# Patient Record
Sex: Female | Born: 1960 | Race: White | Hispanic: No | Marital: Married | Smoking: Never smoker
Health system: Southern US, Community
[De-identification: ages and names within clinical notes are randomized; demographics above are authoritative.]

## PROBLEM LIST (undated history)

## (undated) DIAGNOSIS — G43919 Migraine, unspecified, intractable, without status migrainosus: Secondary | ICD-10-CM

## (undated) DIAGNOSIS — F32A Depression, unspecified: Secondary | ICD-10-CM

## (undated) DIAGNOSIS — R112 Nausea with vomiting, unspecified: Secondary | ICD-10-CM

## (undated) DIAGNOSIS — Z9889 Other specified postprocedural states: Secondary | ICD-10-CM

## (undated) DIAGNOSIS — F329 Major depressive disorder, single episode, unspecified: Secondary | ICD-10-CM

## (undated) DIAGNOSIS — Z90710 Acquired absence of both cervix and uterus: Secondary | ICD-10-CM

## (undated) DIAGNOSIS — G2581 Restless legs syndrome: Secondary | ICD-10-CM

## (undated) HISTORY — PX: TOTAL ABDOMINAL HYSTERECTOMY: SHX209

## (undated) HISTORY — DX: Migraine, unspecified, intractable, without status migrainosus: G43.919

## (undated) HISTORY — DX: Acquired absence of both cervix and uterus: Z90.710

## (undated) HISTORY — DX: Restless legs syndrome: G25.81

---

## 1999-03-08 ENCOUNTER — Inpatient Hospital Stay (HOSPITAL_COMMUNITY): Admission: AD | Admit: 1999-03-08 | Discharge: 1999-03-08 | Payer: Self-pay | Admitting: Obstetrics and Gynecology

## 2003-03-28 ENCOUNTER — Other Ambulatory Visit: Admission: RE | Admit: 2003-03-28 | Discharge: 2003-03-28 | Payer: Self-pay | Admitting: Obstetrics and Gynecology

## 2004-07-30 ENCOUNTER — Other Ambulatory Visit: Admission: RE | Admit: 2004-07-30 | Discharge: 2004-07-30 | Payer: Self-pay | Admitting: Obstetrics and Gynecology

## 2004-09-08 ENCOUNTER — Observation Stay (HOSPITAL_COMMUNITY): Admission: RE | Admit: 2004-09-08 | Discharge: 2004-09-09 | Payer: Self-pay | Admitting: Obstetrics and Gynecology

## 2005-03-31 ENCOUNTER — Inpatient Hospital Stay (HOSPITAL_COMMUNITY): Admission: AD | Admit: 2005-03-31 | Discharge: 2005-03-31 | Payer: Self-pay | Admitting: Obstetrics and Gynecology

## 2006-06-08 ENCOUNTER — Encounter: Admission: RE | Admit: 2006-06-08 | Discharge: 2006-06-08 | Payer: Self-pay | Admitting: Gastroenterology

## 2006-11-25 ENCOUNTER — Encounter: Admission: RE | Admit: 2006-11-25 | Discharge: 2006-11-25 | Payer: Self-pay | Admitting: Gastroenterology

## 2011-04-01 ENCOUNTER — Other Ambulatory Visit: Payer: Self-pay | Admitting: Neurology

## 2011-04-01 DIAGNOSIS — G43019 Migraine without aura, intractable, without status migrainosus: Secondary | ICD-10-CM

## 2011-04-01 DIAGNOSIS — R51 Headache: Secondary | ICD-10-CM

## 2011-04-05 ENCOUNTER — Ambulatory Visit
Admission: RE | Admit: 2011-04-05 | Discharge: 2011-04-05 | Disposition: A | Discharge status: Home / Self Care | Payer: 59 | Source: Ambulatory Visit | Attending: Neurology | Admitting: Neurology

## 2011-04-05 DIAGNOSIS — R51 Headache: Secondary | ICD-10-CM

## 2011-04-05 DIAGNOSIS — G43019 Migraine without aura, intractable, without status migrainosus: Secondary | ICD-10-CM

## 2012-11-20 ENCOUNTER — Other Ambulatory Visit: Payer: Self-pay | Admitting: Neurology

## 2012-11-22 ENCOUNTER — Other Ambulatory Visit: Payer: Self-pay | Admitting: Neurology

## 2012-11-25 ENCOUNTER — Other Ambulatory Visit: Payer: Self-pay | Admitting: Neurology

## 2012-12-04 ENCOUNTER — Ambulatory Visit (INDEPENDENT_AMBULATORY_CARE_PROVIDER_SITE_OTHER): Payer: BC Managed Care – PPO | Admitting: Nurse Practitioner

## 2012-12-04 ENCOUNTER — Encounter: Payer: Self-pay | Admitting: Nurse Practitioner

## 2012-12-04 VITALS — BP 110/70 | HR 100 | Ht 67.75 in | Wt 181.0 lb

## 2012-12-04 DIAGNOSIS — G43019 Migraine without aura, intractable, without status migrainosus: Secondary | ICD-10-CM

## 2012-12-04 DIAGNOSIS — R519 Headache, unspecified: Secondary | ICD-10-CM | POA: Insufficient documentation

## 2012-12-04 DIAGNOSIS — R51 Headache: Secondary | ICD-10-CM

## 2012-12-04 DIAGNOSIS — G2581 Restless legs syndrome: Secondary | ICD-10-CM

## 2012-12-04 HISTORY — DX: Migraine without aura, intractable, without status migrainosus: G43.019

## 2012-12-04 MED ORDER — RIZATRIPTAN BENZOATE 10 MG PO TABS: 10.0000 mg | ORAL_TABLET | ORAL | Status: DC | PRN

## 2012-12-04 NOTE — Progress Notes (Signed)
HPI: Patient returns for followup after her last visit 03/17/2012. She has a history of migraine headaches. She had a normal CT of the head in September 2012. She is currently on Topamax 150 mg daily. She takes Imitrex acutely but states it no longer works. She has tried Amerge, Relpax, and Migranal  in the past with little effect. Her headaches can be triggered by odors and she works as a Patent examiner going into people's houses. Her headaches can also be triggered by stress and lack of sleep. Depacon injections in the past have not been effective   ROS:  - headache   Physical Exam General: well developed, well nourished, seated, in no evident distress Head: head normocephalic and atraumatic. Oropharynx benign Neck: supple with no carotid or supraclavicular bruits Cardiovascular: regular rate and rhythm, no murmurs  Neurologic Exam Mental Status: Awake and fully alert. Oriented to place and time. Follows all commands Mood and affect appropriate.  Cranial Nerves: Pupils equal, briskly reactive to light. Extraocular movements full without nystagmus. Visual fields full to confrontation. Hearing intact and symmetric to finger snap. Facial sensation intact. Face, tongue, palate move normally and symmetrically. Neck flexion and extension normal.  Motor: Normal bulk and tone. Normal strength in all tested extremity muscles. Coordination: Rapid alternating movements normal in all extremities. Finger-to-nose and heel-to-shin performed accurately bilaterally. Gait and Station: Arises from chair without difficulty. Stance is normal. Gait demonstrates normal stride length and balance . Able to heel, toe and tandem walk without difficulty.  Reflexes: 2+ and symmetric. Toes downgoing.     ASSESSMENT: Intractable migraine headaches which are fairly stable, one headache a week, Imitrex does not work acutely any longer.     PLAN: Continue Topamax 150 mg daily Will try Maxalt 10 mg acute headache and  repeat x1 if needed Continue Phenergan for nausea Followup in 6 months   Nilda Riggs, GNP-BC APRN

## 2012-12-04 NOTE — Progress Notes (Signed)
I have read the note, and I agree with the clinical assessment and plan.  

## 2012-12-04 NOTE — Patient Instructions (Addendum)
Try Maxalt acutely, may repeat x1 in 24 hours Continue Phenergan as needed for nausea Continue Topamax 150 mg daily for prevention of headaches Reviewed all migraine triggers Followup in 6 months and as needed

## 2012-12-13 ENCOUNTER — Telehealth: Payer: Self-pay | Admitting: Nurse Practitioner

## 2012-12-13 MED ORDER — PREDNISONE 10 MG PO TABS: ORAL_TABLET | ORAL | Status: DC

## 2012-12-13 NOTE — Telephone Encounter (Signed)
Informed pt .

## 2012-12-13 NOTE — Telephone Encounter (Signed)
Having HA's daily. Does not feel meds are working.  HA 8 on pain scale. Says entire head is hurting. Nothing alleviates. HA's are intermittent daily. Current one she is having started in the middle of the night last night and has not went away. Requesting advice. Willing to try different med if possible.

## 2012-12-13 NOTE — Telephone Encounter (Signed)
Will call in dose pack, take as directed

## 2012-12-23 ENCOUNTER — Other Ambulatory Visit: Payer: Self-pay | Admitting: Neurology

## 2013-01-09 ENCOUNTER — Telehealth: Payer: Self-pay

## 2013-01-09 MED ORDER — SUMATRIPTAN SUCCINATE 100 MG PO TABS: 100.0000 mg | ORAL_TABLET | ORAL | Status: DC | PRN

## 2013-01-09 NOTE — Telephone Encounter (Signed)
Message copied by Malachy Moan on Tue Jan 09, 2013  9:45 AM ------      Message from: [facility], [place]      Created: Tue Jan 09, 2013  8:27 AM      Contact: pt/ cell [phone]        Amanda [name] put pt on Maxalt and it is not working. She needs to go back to generic Imitrix.  She would like for [facility] [address] Please call cell @ [phone]. ------

## 2013-01-09 NOTE — Telephone Encounter (Signed)
Eber Jones, would you like to change the patient back to Sumatriptan?  Please advise.  Thank you.

## 2013-01-09 NOTE — Telephone Encounter (Signed)
Pt can be placed back on imitrex.

## 2013-01-09 NOTE — Telephone Encounter (Signed)
Message copied by Salome Spotted on Tue Jan 09, 2013  1:56 PM ------      Message from: [facility], [place]      Created: Tue Jan 09, 2013  8:27 AM      Contact: pt/ cell [phone]        Amanda [name] put pt on Maxalt and it is not working. She needs to go back to generic Imitrix.  She would like for [facility] [address] Please call cell @ [phone]. ------

## 2013-02-08 ENCOUNTER — Ambulatory Visit: Payer: BC Managed Care – PPO | Admitting: Nurse Practitioner

## 2013-02-12 ENCOUNTER — Telehealth: Payer: Self-pay | Admitting: Nurse Practitioner

## 2013-02-12 MED ORDER — VERAPAMIL HCL ER 120 MG PO TBCR: 120.0000 mg | EXTENDED_RELEASE_TABLET | Freq: Every day | ORAL | Status: DC

## 2013-02-12 NOTE — Telephone Encounter (Signed)
I called and spoke with patient and she stated that she's having a migraine on a daily basis and she thinks Topamax needs to be changed or increasing Effexor. Patient would like to speak with physician.

## 2013-02-12 NOTE — Telephone Encounter (Signed)
Pt called wants to try to get in sooner than her apt in Nov, pt states that she is having migraines more often and would like for a nurse to give her a call to see if we something soon.

## 2013-02-12 NOTE — Telephone Encounter (Signed)
I called patient. The patient has had a significant increase in the frequency of her headaches. In the fall of 2013, her insurance company denied the use of Botox. The patient has significant depression, and beta blockers cannot be used. The patient is already on high doses of Topamax and Effexor. I will place the patient on verapamil, and we will need to get a revisit.

## 2013-02-12 NOTE — Telephone Encounter (Signed)
Pt called back needs nurse to return her call on her cell #. Thanks

## 2013-03-06 ENCOUNTER — Other Ambulatory Visit: Payer: Self-pay | Admitting: Neurology

## 2013-03-09 ENCOUNTER — Telehealth: Payer: Self-pay | Admitting: Neurology

## 2013-03-09 MED ORDER — PROMETHAZINE HCL 25 MG PO TABS: 25.0000 mg | ORAL_TABLET | Freq: Four times a day (QID) | ORAL | Status: DC | PRN

## 2013-03-09 NOTE — Telephone Encounter (Signed)
Refill was [facility] to [facility] [address]

## 2013-04-03 ENCOUNTER — Telehealth: Payer: Self-pay | Admitting: Neurology

## 2013-04-03 NOTE — Telephone Encounter (Signed)
I called pt she relayed that she sent in letter about this Cefaly (tens unit like headband).  Needs prescription for this, and would like to try for her migraines.   Faxed to [phone].

## 2013-04-05 NOTE — Telephone Encounter (Signed)
I attempted twice to fax and would not goo thru.   I called pt and relayed this to her, she stated she had gotten ok to receive the device.  I told her I would mail her the prescription so if she needs this.  Placed in mail today 04-05-13.

## 2013-05-09 ENCOUNTER — Other Ambulatory Visit: Payer: Self-pay | Admitting: Neurology

## 2013-05-14 ENCOUNTER — Other Ambulatory Visit: Payer: Self-pay | Admitting: Neurology

## 2013-06-06 ENCOUNTER — Encounter (INDEPENDENT_AMBULATORY_CARE_PROVIDER_SITE_OTHER): Payer: Self-pay

## 2013-06-06 ENCOUNTER — Ambulatory Visit (INDEPENDENT_AMBULATORY_CARE_PROVIDER_SITE_OTHER): Payer: BC Managed Care – PPO | Admitting: Nurse Practitioner

## 2013-06-06 ENCOUNTER — Encounter: Payer: Self-pay | Admitting: Nurse Practitioner

## 2013-06-06 VITALS — BP 96/72 | HR 72 | Ht 68.75 in | Wt 182.0 lb

## 2013-06-06 DIAGNOSIS — G43019 Migraine without aura, intractable, without status migrainosus: Secondary | ICD-10-CM

## 2013-06-06 DIAGNOSIS — R51 Headache: Secondary | ICD-10-CM

## 2013-06-06 DIAGNOSIS — G2581 Restless legs syndrome: Secondary | ICD-10-CM

## 2013-06-06 MED ORDER — PROMETHAZINE HCL 25 MG PO TABS: 25.0000 mg | ORAL_TABLET | Freq: Four times a day (QID) | ORAL | Status: DC | PRN

## 2013-06-06 MED ORDER — TOPIRAMATE 50 MG PO TABS: ORAL_TABLET | ORAL | Status: DC

## 2013-06-06 MED ORDER — SUMATRIPTAN SUCCINATE 100 MG PO TABS: 100.0000 mg | ORAL_TABLET | ORAL | Status: DC | PRN

## 2013-06-06 NOTE — Progress Notes (Signed)
[facility]  PATIENT: Amanda [name] DOB: [birth year]   REASON FOR VISIT: Followup for migraines   HISTORY OF PRESENT ILLNESS:Amanda [name], 52 year old returns for followup. She has a history of migraine headaches, since last seen she was switched from Maxalt to Imitrex which does work. She also has significant nausea and vomiting with her headaches and is on Phenergan. She was placed on verapamil 120 mg at bedtime by Dr. Anne Hahn however her blood pressure was  90 systolic and she stopped the medication. She continues to have frequent headaches, was fired  from her job due to her headaches. She is aware that weather changes as well as stress caused her to have headaches. Her most recent migraine occurred arguing with her daughter over her upcoming wedding. She says the Phenergan has helped her sleep and stops the nausea and vomiting. She was also given a prednisone dosepak back in June  that helped her daily headache. Significant history of depression and she does not exercise she is put on about 20 pounds in the last 2 years.She has tried Amerge, Relpax, and Migranal in the past with little effect. She has not kept a diary of her headaches  HISTORY: She has a history of migraine headaches. She had a normal CT of the head in September 2012. She is currently on Topamax 150 mg daily. She takes Imitrex acutely but states it no longer works. She has tried Amerge, Relpax, and Migranal in the past with little effect. Her headaches can be triggered by odors and she works as a Patent examiner going into people's houses. Her headaches can also be triggered by stress and lack of sleep. Depacon injections in the past have not been effective       REVIEW OF SYSTEMS: Full 14 system review of systems performed and notable only for:  Constitutional: N/A  Cardiovascular: N/A  Ear/Nose/Throat: N/A  Skin: N/A  Eyes: N/A  Respiratory: N/A  Gastroitestinal: N/A  Hematology/Lymphatic:  N/A  Endocrine: N/A Musculoskeletal:N/A  Allergy/Immunology: N/A  Neurological: Headaches  Psychiatric: N/A   ALLERGIES: Allergies  Allergen Reactions  . Aspirin     Makes her ears ring    HOME MEDICATIONS: Outpatient Prescriptions Prior to Visit  Medication Sig Dispense Refill  . Calcium Carbonate-Vit D-Min (CALCIUM 1200 PO) Take 1,200 mg by mouth.      . clonazePAM (KLONOPIN) 1 MG tablet TAKE 1 AND 1/2 TABLET AT BEDTIME  45 tablet  5  . Magnesium 400 MG CAPS Take 400 mg by mouth daily.      . metoCLOPramide (REGLAN) 10 MG tablet Take 10 mg by mouth daily.      . montelukast (SINGULAIR) 10 MG tablet Take 10 mg by mouth at bedtime.      . promethazine (PHENERGAN) 25 MG tablet TAKE 1 TABLET EVERY 6 HOURS AS NEEDED FOR NAUSEA  40 tablet  1  . SUMAtriptan (IMITREX) 100 MG tablet Take 1 tablet (100 mg total) by mouth as needed for migraine.  9 tablet  5  . topiramate (TOPAMAX) 50 MG tablet ONE TABLET IN THE MORNING AND TWO TABLETS IN THE EVENING  90 tablet  1  . venlafaxine XR (EFFEXOR-XR) 75 MG 24 hr capsule TAKE 3 CAPSULES BY MOUTH EVERY DAY  90 capsule  6  . naproxen (NAPROSYN) 500 MG tablet Take 500 mg by mouth as needed.      . predniSONE (DELTASONE) 10 MG tablet 6 day dose pack  21 tablet  0  . verapamil (CALAN-SR) 120 MG CR tablet TAKE 1 TABLET BY MOUTH AT BEDTIME  30 tablet  3   No facility-administered medications prior to visit.    PAST MEDICAL HISTORY: Past Medical History  Diagnosis Date  . Migraine with intractable migraine   . Restless leg syndrome   . History of total hysterectomy     PAST SURGICAL HISTORY: History reviewed. No pertinent past surgical history.  FAMILY HISTORY: Family History  Problem Relation Age of Onset  . Lung cancer Father   . Dementia Mother   . Migraines Sister   . Migraines Sister   . Migraines Sister   . Hypothyroidism Sister   . Hypothyroidism Sister   . Hypothyroidism Sister     SOCIAL HISTORY: History   Social History   . Marital Status: Married    Spouse Name: [name]    Number of Children: 2  . Years of Education: 14   Occupational History  .  [employer]   Social History Main Topics  . Smoking status: Never Smoker   . Smokeless tobacco: Never Used  . Alcohol Use: Yes     Comment: rarely  . Drug Use: No  . Sexual Activity: Not on file   Other Topics Concern  . Not on file   Social History Narrative   Patient lives at home with her husbandGreig Castilla) and she is a Engineer, civil (consulting). The patient has 2 daughters.    Patient drinks very little caffeine, maybe one cup daily.P   Patient has a college education.   Patient is currently not working.     PHYSICAL EXAM  Filed Vitals:   06/06/13 0830  BP: 96/72  Pulse: 72  Height: 5' 8.75" (1.746 m)  Weight: 182 lb (82.555 kg)   Body mass index is 27.08 kg/(m^2).  Generalized: Well developed, in no acute distress  Head: normocephalic and atraumatic,. Oropharynx benign  Neck: Supple, no carotid bruits  Cardiac: Regular rate rhythm, no murmur  Musculoskeletal: No deformity   Neurological examination   Mentation: Alert oriented to time, place, history taking. Follows all commands speech and language fluent  Cranial nerve II-XII: Pupils were equal round reactive to light extraocular movements were full, visual field were full on confrontational test. Facial sensation and strength were normal. hearing was intact to finger rubbing bilaterally. Uvula tongue midline. head turning and shoulder shrug and were normal and symmetric.Tongue protrusion into cheek strength was normal. Motor: normal bulk and tone, full strength in the BUE, BLE, fine finger movements normal, no pronator drift. No focal weakness Coordination: finger-nose-finger, heel-to-shin bilaterally, no dysmetria Reflexes: Brachioradialis 2/2, biceps 2/2, triceps 2/2, patellar 2/2, Achilles 2/2, plantar responses were flexor bilaterally. Gait and Station: Rising up from seated position without  assistance, normal stance,  moderate stride, good arm swing, smooth turning, able to perform tiptoe, and heel walking without difficulty. Tandem gait steady  DIAGNOSTIC DATA (LABS, IMAGING, TESTING) - None to review  ASSESSMENT AND PLAN  52 y.o. year old female  has a past medical history of Migraine with intractable migraine; Restless leg syndrome; and History of total hysterectomy. here to followup for frequent headaches. She has not kept a diary. Since last seen she had a  prednisone Dosepak which was beneficial, switched from Maxalt to Imitrex, and had decreased blood pressure on verapamil so she stopped it.  He remains on Topamax 150 twice daily and magnesium 400 mg daily.  Continue Topamax 150 mg daily Continue Imitrex will refill Given information  on butterbur for migraines and Ausanil, both homeopathic remedies for migraine Ribloflavin 100mg   2tabs twice daily F/U in 3 months, pt to keep a headache diary. Vst time 35 min Nilda Riggs, [facility], [facility], APRN  [facility] [address], [address] [place], [place] [address] [phone]

## 2013-06-06 NOTE — Patient Instructions (Signed)
Continue Topamax 150 mg daily Continue Imitrex will refill Given information on butterbur for migraines and Ausanil, both homeopathic remedies for migraine Ribloflavin 100mg   2tabs twice daily F/U in 3 months

## 2013-06-10 ENCOUNTER — Other Ambulatory Visit: Payer: Self-pay | Admitting: Neurology

## 2013-06-11 ENCOUNTER — Other Ambulatory Visit: Payer: Self-pay | Admitting: Neurology

## 2013-06-11 NOTE — Telephone Encounter (Signed)
[phone]

## 2013-09-07 ENCOUNTER — Ambulatory Visit: Payer: BC Managed Care – PPO | Admitting: Nurse Practitioner

## 2015-11-11 DIAGNOSIS — G4440 Drug-induced headache, not elsewhere classified, not intractable: Secondary | ICD-10-CM | POA: Diagnosis not present

## 2015-11-11 DIAGNOSIS — G2581 Restless legs syndrome: Secondary | ICD-10-CM | POA: Diagnosis not present

## 2015-11-11 DIAGNOSIS — G43719 Chronic migraine without aura, intractable, without status migrainosus: Secondary | ICD-10-CM | POA: Diagnosis not present

## 2015-11-11 DIAGNOSIS — F4321 Adjustment disorder with depressed mood: Secondary | ICD-10-CM | POA: Diagnosis not present

## 2015-11-14 DIAGNOSIS — F331 Major depressive disorder, recurrent, moderate: Secondary | ICD-10-CM | POA: Diagnosis not present

## 2015-11-24 DIAGNOSIS — F331 Major depressive disorder, recurrent, moderate: Secondary | ICD-10-CM | POA: Diagnosis not present

## 2015-12-03 DIAGNOSIS — Z1231 Encounter for screening mammogram for malignant neoplasm of breast: Secondary | ICD-10-CM | POA: Diagnosis not present

## 2015-12-03 DIAGNOSIS — Z01419 Encounter for gynecological examination (general) (routine) without abnormal findings: Secondary | ICD-10-CM | POA: Diagnosis not present

## 2015-12-03 DIAGNOSIS — Z6828 Body mass index (BMI) 28.0-28.9, adult: Secondary | ICD-10-CM | POA: Diagnosis not present

## 2015-12-29 DIAGNOSIS — F331 Major depressive disorder, recurrent, moderate: Secondary | ICD-10-CM | POA: Diagnosis not present

## 2016-01-21 DIAGNOSIS — F331 Major depressive disorder, recurrent, moderate: Secondary | ICD-10-CM | POA: Diagnosis not present

## 2016-02-03 DIAGNOSIS — G43019 Migraine without aura, intractable, without status migrainosus: Secondary | ICD-10-CM | POA: Diagnosis not present

## 2016-02-03 DIAGNOSIS — G2581 Restless legs syndrome: Secondary | ICD-10-CM | POA: Diagnosis not present

## 2016-02-11 DIAGNOSIS — F331 Major depressive disorder, recurrent, moderate: Secondary | ICD-10-CM | POA: Diagnosis not present

## 2016-03-03 DIAGNOSIS — G43009 Migraine without aura, not intractable, without status migrainosus: Secondary | ICD-10-CM | POA: Diagnosis not present

## 2016-03-03 DIAGNOSIS — G43011 Migraine without aura, intractable, with status migrainosus: Secondary | ICD-10-CM | POA: Diagnosis not present

## 2016-03-04 DIAGNOSIS — F331 Major depressive disorder, recurrent, moderate: Secondary | ICD-10-CM | POA: Diagnosis not present

## 2016-04-15 DIAGNOSIS — F331 Major depressive disorder, recurrent, moderate: Secondary | ICD-10-CM | POA: Diagnosis not present

## 2016-04-18 ENCOUNTER — Observation Stay (HOSPITAL_BASED_OUTPATIENT_CLINIC_OR_DEPARTMENT_OTHER)
Admission: EM | Admit: 2016-04-18 | Discharge: 2016-04-19 | Disposition: A | Discharge status: Home / Self Care | Payer: BLUE CROSS/BLUE SHIELD | Attending: Family Medicine | Admitting: Family Medicine

## 2016-04-18 ENCOUNTER — Emergency Department (HOSPITAL_BASED_OUTPATIENT_CLINIC_OR_DEPARTMENT_OTHER): Payer: BLUE CROSS/BLUE SHIELD

## 2016-04-18 ENCOUNTER — Encounter (HOSPITAL_BASED_OUTPATIENT_CLINIC_OR_DEPARTMENT_OTHER): Payer: Self-pay | Admitting: Emergency Medicine

## 2016-04-18 DIAGNOSIS — G43019 Migraine without aura, intractable, without status migrainosus: Secondary | ICD-10-CM | POA: Diagnosis not present

## 2016-04-18 DIAGNOSIS — R4701 Aphasia: Secondary | ICD-10-CM | POA: Insufficient documentation

## 2016-04-18 DIAGNOSIS — R4789 Other speech disturbances: Secondary | ICD-10-CM | POA: Diagnosis not present

## 2016-04-18 DIAGNOSIS — F329 Major depressive disorder, single episode, unspecified: Secondary | ICD-10-CM | POA: Insufficient documentation

## 2016-04-18 DIAGNOSIS — G9340 Encephalopathy, unspecified: Secondary | ICD-10-CM | POA: Diagnosis not present

## 2016-04-18 DIAGNOSIS — G43809 Other migraine, not intractable, without status migrainosus: Secondary | ICD-10-CM

## 2016-04-18 DIAGNOSIS — Z886 Allergy status to analgesic agent status: Secondary | ICD-10-CM | POA: Diagnosis not present

## 2016-04-18 DIAGNOSIS — R270 Ataxia, unspecified: Secondary | ICD-10-CM | POA: Diagnosis not present

## 2016-04-18 DIAGNOSIS — G2581 Restless legs syndrome: Secondary | ICD-10-CM | POA: Diagnosis not present

## 2016-04-18 DIAGNOSIS — R4182 Altered mental status, unspecified: Secondary | ICD-10-CM

## 2016-04-18 DIAGNOSIS — E860 Dehydration: Secondary | ICD-10-CM | POA: Insufficient documentation

## 2016-04-18 DIAGNOSIS — R739 Hyperglycemia, unspecified: Secondary | ICD-10-CM | POA: Insufficient documentation

## 2016-04-18 DIAGNOSIS — Z7401 Bed confinement status: Secondary | ICD-10-CM | POA: Diagnosis not present

## 2016-04-18 DIAGNOSIS — G8929 Other chronic pain: Secondary | ICD-10-CM | POA: Insufficient documentation

## 2016-04-18 DIAGNOSIS — R479 Unspecified speech disturbances: Secondary | ICD-10-CM

## 2016-04-18 HISTORY — DX: Major depressive disorder, single episode, unspecified: F32.9

## 2016-04-18 HISTORY — DX: Depression, unspecified: F32.A

## 2016-04-18 LAB — CBC
HCT: 47 % — ABNORMAL HIGH (ref 36.0–46.0)
HEMOGLOBIN: 15.4 g/dL — AB (ref 12.0–15.0)
MCH: 32.2 pg (ref 26.0–34.0)
MCHC: 32.8 g/dL (ref 30.0–36.0)
MCV: 98.1 fL (ref 78.0–100.0)
Platelets: 359 10*3/uL (ref 150–400)
RBC: 4.79 MIL/uL (ref 3.87–5.11)
RDW: 13.8 % (ref 11.5–15.5)
WBC: 15 10*3/uL — ABNORMAL HIGH (ref 4.0–10.5)

## 2016-04-18 LAB — COMPREHENSIVE METABOLIC PANEL
ALK PHOS: 78 U/L (ref 38–126)
ALT: 13 U/L — AB (ref 14–54)
ANION GAP: 11 (ref 5–15)
AST: 29 U/L (ref 15–41)
Albumin: 4.3 g/dL (ref 3.5–5.0)
BILIRUBIN TOTAL: 0.5 mg/dL (ref 0.3–1.2)
BUN: 13 mg/dL (ref 6–20)
CALCIUM: 9.2 mg/dL (ref 8.9–10.3)
CO2: 24 mmol/L (ref 22–32)
CREATININE: 1.1 mg/dL — AB (ref 0.44–1.00)
Chloride: 109 mmol/L (ref 101–111)
GFR, EST NON AFRICAN AMERICAN: 55 mL/min — AB (ref >60)
Glucose, Bld: 190 mg/dL — ABNORMAL HIGH (ref 65–99)
Potassium: 3.8 mmol/L (ref 3.5–5.1)
SODIUM: 144 mmol/L (ref 135–145)
TOTAL PROTEIN: 7.6 g/dL (ref 6.5–8.1)

## 2016-04-18 LAB — URINALYSIS, ROUTINE W REFLEX MICROSCOPIC
GLUCOSE, UA: NEGATIVE mg/dL
Ketones, ur: 15 mg/dL — AB
LEUKOCYTES UA: NEGATIVE
Nitrite: NEGATIVE
PH: 5.5 (ref 5.0–8.0)
PROTEIN: NEGATIVE mg/dL
SPECIFIC GRAVITY, URINE: 1.018 (ref 1.005–1.030)

## 2016-04-18 LAB — URINE MICROSCOPIC-ADD ON

## 2016-04-18 LAB — SALICYLATE LEVEL: Salicylate Lvl: <4 mg/dL (ref 2.8–30.0)

## 2016-04-18 LAB — ETHANOL

## 2016-04-18 LAB — I-STAT CG4 LACTIC ACID, ED: Lactic Acid, Venous: 2.5 mmol/L (ref 0.5–1.9)

## 2016-04-18 LAB — RAPID URINE DRUG SCREEN, HOSP PERFORMED
Amphetamines: NOT DETECTED
BARBITURATES: NOT DETECTED
BENZODIAZEPINES: POSITIVE — AB
COCAINE: NOT DETECTED
OPIATES: POSITIVE — AB
Tetrahydrocannabinol: NOT DETECTED

## 2016-04-18 LAB — ACETAMINOPHEN LEVEL: Acetaminophen (Tylenol), Serum: <10 ug/mL — ABNORMAL LOW (ref 10–30)

## 2016-04-18 MED ORDER — SODIUM CHLORIDE 0.9 % IV BOLUS (SEPSIS)
1000.0000 mL | Freq: Once | INTRAVENOUS | Status: AC
  Administered 2016-04-19: 1000 mL via INTRAVENOUS

## 2016-04-18 NOTE — ED Triage Notes (Addendum)
Pt in with family c/o altered mental status, lethargy, ataxia, slurred speech and expressive aphasia. Pt with hx of chronic migraines and husband states she stays in bed most of the time. States noticed approx 45 min PTA that pt was slurring and her words were not making sense. These sx are not typical of her migraines. Pt is alert, interactive, in NAD.

## 2016-04-18 NOTE — ED Notes (Signed)
MD at bedside. 

## 2016-04-18 NOTE — ED Notes (Signed)
Pt I&O catherized due to inability to void on her own. [name] RN assisting with procedure, patient and family educated prior to procedure.

## 2016-04-18 NOTE — ED Notes (Signed)
Pt returned from CT °

## 2016-04-18 NOTE — ED Notes (Signed)
Patient transported to CT 

## 2016-04-18 NOTE — ED Provider Notes (Addendum)
[facility] Provider Note   CSN: [id number] Arrival date & time: 04/18/16  2250     History   Chief Complaint Chief Complaint  Patient presents with  . Aphasia  . Altered Mental Status    HPI Amanda [name] is a 55 y.o. female.  Patient with hx chronic 'migraines' presents w family who indicates seh seems generally slow to respond today, and that she seemed confused.  Spouse indicates he last spoke with patient when she seemed at her baseline yesterday - as such, patients last seen normal time was at some point yesterday (Saturday) evening. Today he went to church, and in and out of home, and each time he checked on patient she was sleeping.  He indicates w her migraines/chronic pain, and depression that she is in bed, most all of the time, for the past 3 years.   Indicates in past couple months some increasing depression, and that her abilify dose was adjusted last month.  Only other medical change was several months ago at which time was taken off effexor and placed on abilify.  Patient does give herself her own meds. Family indicates she does not take extra of her meds. Pt denies overuse/overdose, but is extremely limited historian - level 5 caveat.  Patient seems globally confused, slow to respond, slurs some words/inconsistent.  Patients speech content seems largely non sensical, with words being joined, pieced together in odd ways, or confabulated. Does move bil ext purposefully but for most part does not follow commands and seems to exert poor effort on neuro exam.  Family/pt denies fever. No report of trauma or fall.    The history is provided by the patient, the spouse and a relative. The history is limited by the condition of the patient.  Altered Mental Status   Associated symptoms include confusion.    Past Medical History:  Diagnosis Date  . History of total hysterectomy   . Migraine with intractable migraine   . Restless leg syndrome     Patient Active  Problem List   Diagnosis Date Noted  . Migraine without aura, with intractable migraine, so stated, without mention of status migrainosus 12/04/2012  . Headache(784.0) 12/04/2012  . Restless legs syndrome (RLS) 12/04/2012    History reviewed. No pertinent surgical history.  OB History    No data available       Home Medications    Prior to Admission medications   Medication Sig Start Date End Date Taking? Authorizing Provider  Calcium Carbonate-Vit D-Min (CALCIUM 1200 PO) Take 1,200 mg by mouth.    Historical Provider, MD  clonazePAM (KLONOPIN) 1 MG tablet TAKE 1&1/2 TABLETS BY MOUTH AT BEDTIME 06/10/13   York Spaniel, MD  Magnesium 400 MG CAPS Take 400 mg by mouth daily.    Historical Provider, MD  metoCLOPramide (REGLAN) 10 MG tablet Take 10 mg by mouth daily.    Historical Provider, MD  montelukast (SINGULAIR) 10 MG tablet Take 10 mg by mouth at bedtime.    Historical Provider, MD  promethazine (PHENERGAN) 25 MG tablet Take 1 tablet (25 mg total) by mouth every 6 (six) hours as needed for nausea or vomiting. 06/06/13   Nilda Riggs, NP  SUMAtriptan (IMITREX) 100 MG tablet Take 1 tablet (100 mg total) by mouth as needed for migraine. 06/06/13   Nilda Riggs, NP  topiramate (TOPAMAX) 50 MG tablet 1 tab in the am, 2 tabs at night 06/06/13   Nilda Riggs, NP  venlafaxine  XR (EFFEXOR-XR) 75 MG 24 hr capsule TAKE 3 CAPSULES BY MOUTH EVERY DAY 11/22/12   York Spaniel, MD    Family History Family History  Problem Relation Age of Onset  . Dementia Mother   . Lung cancer Father   . Migraines Sister   . Migraines Sister   . Migraines Sister   . Hypothyroidism Sister   . Hypothyroidism Sister   . Hypothyroidism Sister     Social History Social History  Substance Use Topics  . Smoking status: Never Smoker  . Smokeless tobacco: Never Used  . Alcohol use Yes     Comment: rarely     Allergies   Aspirin   Review of Systems Review of Systems    Unable to perform ROS: Mental status change  Constitutional: Negative for fever.  Psychiatric/Behavioral: Positive for confusion.  patient poorly responsive/does not cooperate w ros.    Physical Exam Updated Vital Signs BP 106/79 (BP Location: Left Arm)   Pulse 90   Temp 97.8 F (36.6 C) (Oral)   Resp 16   Wt 68 kg   SpO2 95%   BMI 22.31 kg/m   Physical Exam  Constitutional: She appears well-developed and well-nourished. No distress.  HENT:  Head: Atraumatic.  Nose: Nose normal.  Mouth/Throat: Oropharynx is clear and moist.  No sinus or temporal tenderness.  Eyes: Conjunctivae and EOM are normal. Pupils are equal, round, and reactive to light. No scleral icterus.  Neck: Neck supple. No tracheal deviation present. No thyromegaly present.  No stiffness or rigidity.   Cardiovascular: Normal rate, regular rhythm, normal heart sounds and intact distal pulses.  Exam reveals no gallop and no friction rub.   No murmur heard. Pulmonary/Chest: Effort normal and breath sounds normal. No respiratory distress.  Abdominal: Soft. Normal appearance and bowel sounds are normal. She exhibits no distension. There is no tenderness.  Genitourinary:  Genitourinary Comments: No cva tenderness.  Musculoskeletal: She exhibits no edema or tenderness.  Neurological: No cranial nerve deficit.  Patient appears to be listening to conversation, but responds verbally on very limited basis.  No clear expressive aphasia or dysarthria, but slurs words . Content of speech is very odd, joining words, making up other words, confabulating others. Moves bil ext purposefully, but very poorly responsive to neuro exam, ?effort.   Skin: Skin is warm and dry. No rash noted. She is not diaphoretic.  Psychiatric:  Lethargic, slow to respond.   Nursing note and vitals reviewed.    ED Treatments / Results  Labs (all labs ordered are listed, but only abnormal results are displayed) Results for orders placed or  performed during the hospital encounter of 04/18/16  CBC  Result Value Ref Range   WBC 15.0 (H) 4.0 - 10.5 K/uL   RBC 4.79 3.87 - 5.11 MIL/uL   Hemoglobin 15.4 (H) 12.0 - 15.0 g/dL   HCT 16.1 (H) 09.6 - 04.5 %   MCV 98.1 78.0 - 100.0 fL   MCH 32.2 26.0 - 34.0 pg   MCHC 32.8 30.0 - 36.0 g/dL   RDW 40.9 81.1 - 91.4 %   Platelets 359 150 - 400 K/uL  Comprehensive metabolic panel  Result Value Ref Range   Sodium 144 135 - 145 mmol/L   Potassium 3.8 3.5 - 5.1 mmol/L   Chloride 109 101 - 111 mmol/L   CO2 24 22 - 32 mmol/L   Glucose, Bld 190 (H) 65 - 99 mg/dL   BUN 13 6 - 20 mg/dL  Creatinine, Ser 1.10 (H) 0.44 - 1.00 mg/dL   Calcium 9.2 8.9 - 16.110.3 mg/dL   Total Protein 7.6 6.5 - 8.1 g/dL   Albumin 4.3 3.5 - 5.0 g/dL   AST 29 15 - 41 U/L   ALT 13 (L) 14 - 54 U/L   Alkaline Phosphatase 78 38 - 126 U/L   Total Bilirubin 0.5 0.3 - 1.2 mg/dL   GFR calc non Af Amer 55 (L) >60 mL/min   GFR calc Af Amer >60 >60 mL/min   Anion gap 11 5 - 15  Urine rapid drug screen (hosp performed)  Result Value Ref Range   Opiates POSITIVE (A) NONE DETECTED   Cocaine NONE DETECTED NONE DETECTED   Benzodiazepines POSITIVE (A) NONE DETECTED   Amphetamines NONE DETECTED NONE DETECTED   Tetrahydrocannabinol NONE DETECTED NONE DETECTED   Barbiturates NONE DETECTED NONE DETECTED  Urinalysis, Routine w reflex microscopic ([facility])  Result Value Ref Range   Color, Urine YELLOW YELLOW   APPearance CLEAR CLEAR   Specific Gravity, Urine 1.018 1.005 - 1.030   pH 5.5 5.0 - 8.0   Glucose, UA NEGATIVE NEGATIVE mg/dL   Hgb urine dipstick SMALL (A) NEGATIVE   Bilirubin Urine SMALL (A) NEGATIVE   Ketones, ur 15 (A) NEGATIVE mg/dL   Protein, ur NEGATIVE NEGATIVE mg/dL   Nitrite NEGATIVE NEGATIVE   Leukocytes, UA NEGATIVE NEGATIVE  Ethanol  Result Value Ref Range   Alcohol, Ethyl (B) <5 <5 mg/dL  Salicylate level  Result Value Ref Range   Salicylate Lvl <4.0 2.8 - 30.0 mg/dL  Acetaminophen level  Result  Value Ref Range   Acetaminophen (Tylenol), Serum <10 (L) 10 - 30 ug/mL  Urine microscopic-add on  Result Value Ref Range   Squamous Epithelial / LPF 0-5 (A) NONE SEEN   WBC, UA 0-5 0 - 5 WBC/hpf   RBC / HPF 0-5 0 - 5 RBC/hpf   Bacteria, UA FEW (A) NONE SEEN  I-Stat CG4 Lactic Acid, ED  Result Value Ref Range   Lactic Acid, Venous 2.50 (HH) 0.5 - 1.9 mmol/L   Comment NOTIFIED PHYSICIAN    Dg Chest 2 View  Result Date: 04/19/2016 CLINICAL DATA:  Altered mental status, lethargy, expressive aphasia. Chronic migraines. EXAM: CHEST  2 VIEW COMPARISON:  None. FINDINGS: The heart size and mediastinal contours are within normal limits. Both lungs are clear. The visualized skeletal structures are unremarkable. IMPRESSION: Normal chest radiograph. Electronically Signed   By: Awilda Metroourtnay  Bloomer M.D.   On: 04/19/2016 00:35   Ct Head Wo Contrast  Result Date: 04/19/2016 CLINICAL DATA:  Altered mental status, lethargy, ataxia, slurred speech and expressive aphasia. History of chronic migraines. EXAM: CT HEAD WITHOUT CONTRAST TECHNIQUE: Contiguous axial images were obtained from the base of the skull through the vertex without intravenous contrast. COMPARISON:  CT HEAD April 05, 2011 FINDINGS: BRAIN: The ventricles and sulci are normal. No intraparenchymal hemorrhage, mass effect nor midline shift. No acute large vascular territory infarcts. No abnormal extra-axial fluid collections. Basal cisterns are patent. VASCULAR: Mild calcific atherosclerosis of the carotid siphons. SKULL/SOFT TISSUES: No skull fracture. Mild RIGHT, moderate LEFT temporomandibular osteoarthrosis. No significant soft tissue swelling. ORBITS/SINUSES: The included ocular globes and orbital contents are normal.The mastoid aircells and included paranasal sinuses are well-aerated. OTHER: None. IMPRESSION: Negative CT HEAD. Electronically Signed   By: Awilda Metroourtnay  Bloomer M.D.   On: 04/19/2016 00:30    EKG  EKG  Interpretation  Date/Time:  Sunday April 18 2016 23:00:19 EDT  Ventricular Rate:  88 PR Interval:    QRS Duration: 88 QT Interval:  377 QTC Calculation: 457 R Axis:   68 Text Interpretation:  Sinus rhythm No previous tracing Confirmed by Denton Lank  MD, Caryn Bee ([id number]) on 04/18/2016 11:18:48 PM       Radiology No results found.  Procedures Procedures (including critical care time)  Medications Ordered in ED Medications - No data to display   Initial Impression / Assessment and Plan / ED Course  I have reviewed the triage vital signs and the nursing notes.  Pertinent labs & imaging results that were available during my care of the patient were reviewed by me and considered in my medical decision making (see chart for details).  Clinical Course   Iv ns. Continuous pulse ox and monitor.    Lactate elevated. ?v poor po intake in past day. Afeb. Iv ns bolus.   Additional labs sent.   Reviewed nursing notes and prior charts for additional history.   Family indicates speech/mental state remains off, and that they havent seen patient like this before.   Patient continues to say non sensical words, for example, when told of plan for transfer to [facility], and possible MRI, says cant have MRI due MRI-ology on finger (indicating a ring that hasnt come off for years).   Patient appears comfortable currently, no distress. Afeb/rectal temp normal.   Discussed w Dr Julian Reil, Hospitalist, at [facility] who accepts to tele obs bed.    Final Clinical Impressions(s) / ED Diagnoses   Final diagnoses:  None    New Prescriptions New Prescriptions   No medications on file        Cathren Laine, MD 04/19/16 0147

## 2016-04-19 ENCOUNTER — Observation Stay (HOSPITAL_BASED_OUTPATIENT_CLINIC_OR_DEPARTMENT_OTHER)
Admit: 2016-04-19 | Discharge: 2016-04-19 | Disposition: A | Discharge status: Home / Self Care | Payer: BLUE CROSS/BLUE SHIELD | Attending: Internal Medicine | Admitting: Internal Medicine

## 2016-04-19 ENCOUNTER — Observation Stay (HOSPITAL_COMMUNITY): Payer: BLUE CROSS/BLUE SHIELD

## 2016-04-19 ENCOUNTER — Encounter (HOSPITAL_BASED_OUTPATIENT_CLINIC_OR_DEPARTMENT_OTHER): Payer: Self-pay | Admitting: Emergency Medicine

## 2016-04-19 DIAGNOSIS — R739 Hyperglycemia, unspecified: Secondary | ICD-10-CM | POA: Diagnosis present

## 2016-04-19 DIAGNOSIS — G43809 Other migraine, not intractable, without status migrainosus: Secondary | ICD-10-CM | POA: Diagnosis not present

## 2016-04-19 DIAGNOSIS — R4182 Altered mental status, unspecified: Secondary | ICD-10-CM

## 2016-04-19 DIAGNOSIS — R410 Disorientation, unspecified: Secondary | ICD-10-CM | POA: Diagnosis not present

## 2016-04-19 DIAGNOSIS — G9340 Encephalopathy, unspecified: Secondary | ICD-10-CM | POA: Diagnosis not present

## 2016-04-19 HISTORY — DX: Hyperglycemia, unspecified: R73.9

## 2016-04-19 HISTORY — DX: Altered mental status, unspecified: R41.82

## 2016-04-19 LAB — COMPREHENSIVE METABOLIC PANEL
ALT: 13 U/L — AB (ref 14–54)
AST: 31 U/L (ref 15–41)
Albumin: 3.3 g/dL — ABNORMAL LOW (ref 3.5–5.0)
Alkaline Phosphatase: 54 U/L (ref 38–126)
Anion gap: 7 (ref 5–15)
BUN: 10 mg/dL (ref 6–20)
CHLORIDE: 110 mmol/L (ref 101–111)
CO2: 23 mmol/L (ref 22–32)
CREATININE: 0.84 mg/dL (ref 0.44–1.00)
Calcium: 8.4 mg/dL — ABNORMAL LOW (ref 8.9–10.3)
GFR calc Af Amer: >60 mL/min (ref >60)
Glucose, Bld: 144 mg/dL — ABNORMAL HIGH (ref 65–99)
Potassium: 3.3 mmol/L — ABNORMAL LOW (ref 3.5–5.1)
Sodium: 140 mmol/L (ref 135–145)
Total Bilirubin: 0.4 mg/dL (ref 0.3–1.2)
Total Protein: 6.1 g/dL — ABNORMAL LOW (ref 6.5–8.1)

## 2016-04-19 LAB — CBC WITH DIFFERENTIAL/PLATELET
Basophils Absolute: 0 10*3/uL (ref 0.0–0.1)
Basophils Relative: 0 %
EOS ABS: 0 10*3/uL (ref 0.0–0.7)
EOS PCT: 0 %
HCT: 40.8 % (ref 36.0–46.0)
Hemoglobin: 13 g/dL (ref 12.0–15.0)
LYMPHS ABS: 0.4 10*3/uL — AB (ref 0.7–4.0)
Lymphocytes Relative: 3 %
MCH: 31.4 pg (ref 26.0–34.0)
MCHC: 31.9 g/dL (ref 30.0–36.0)
MCV: 98.6 fL (ref 78.0–100.0)
MONOS PCT: 7 %
Monocytes Absolute: 1 10*3/uL (ref 0.1–1.0)
Neutro Abs: 12.7 10*3/uL — ABNORMAL HIGH (ref 1.7–7.7)
Neutrophils Relative %: 90 %
PLATELETS: 289 10*3/uL (ref 150–400)
RBC: 4.14 MIL/uL (ref 3.87–5.11)
RDW: 13.4 % (ref 11.5–15.5)
WBC: 14.1 10*3/uL — ABNORMAL HIGH (ref 4.0–10.5)

## 2016-04-19 LAB — AMMONIA: AMMONIA: 41 umol/L — AB (ref 9–35)

## 2016-04-19 LAB — LACTIC ACID, PLASMA: LACTIC ACID, VENOUS: 1.6 mmol/L (ref 0.5–1.9)

## 2016-04-19 LAB — TSH: TSH: 0.322 u[IU]/mL — AB (ref 0.350–4.500)

## 2016-04-19 LAB — HEMOGLOBIN A1C
Hgb A1c MFr Bld: 5.1 % (ref 4.8–5.6)
Mean Plasma Glucose: 100 mg/dL

## 2016-04-19 MED ORDER — LORAZEPAM 2 MG/ML IJ SOLN
INTRAMUSCULAR | Status: AC
  Filled 2016-04-19: qty 1

## 2016-04-19 MED ORDER — PROMETHAZINE HCL 25 MG PO TABS: 25.0000 mg | ORAL_TABLET | Freq: Four times a day (QID) | ORAL | Status: DC | PRN

## 2016-04-19 MED ORDER — VENLAFAXINE HCL ER 75 MG PO CP24
225.0000 mg | ORAL_CAPSULE | Freq: Every day | ORAL | Status: DC
  Administered 2016-04-19: 225 mg via ORAL
  Filled 2016-04-19: qty 3

## 2016-04-19 MED ORDER — LORAZEPAM 2 MG/ML IJ SOLN
1.0000 mg | Freq: Once | INTRAMUSCULAR | Status: AC | PRN
  Administered 2016-04-19: 1 mg via INTRAVENOUS
  Filled 2016-04-19: qty 1

## 2016-04-19 MED ORDER — TOPIRAMATE 25 MG PO TABS
50.0000 mg | ORAL_TABLET | Freq: Every day | ORAL | Status: DC
  Administered 2016-04-19: 50 mg via ORAL
  Filled 2016-04-19: qty 2

## 2016-04-19 MED ORDER — MONTELUKAST SODIUM 10 MG PO TABS: 10.0000 mg | ORAL_TABLET | Freq: Every day | ORAL | Status: DC

## 2016-04-19 MED ORDER — CLONAZEPAM 1 MG PO TABS: 1.5000 mg | ORAL_TABLET | Freq: Every day | ORAL | Status: DC

## 2016-04-19 MED ORDER — IBUPROFEN 200 MG PO TABS
600.0000 mg | ORAL_TABLET | Freq: Four times a day (QID) | ORAL | Status: DC | PRN
  Administered 2016-04-19: 600 mg via ORAL
  Filled 2016-04-19: qty 3

## 2016-04-19 MED ORDER — MAGNESIUM 400 MG PO CAPS: 400.0000 mg | ORAL_CAPSULE | Freq: Every day | ORAL | Status: DC

## 2016-04-19 MED ORDER — MAGNESIUM OXIDE 400 (241.3 MG) MG PO TABS
400.0000 mg | ORAL_TABLET | Freq: Every day | ORAL | Status: DC
  Administered 2016-04-19: 400 mg via ORAL
  Filled 2016-04-19: qty 1

## 2016-04-19 MED ORDER — SODIUM CHLORIDE 0.9 % IV SOLN: INTRAVENOUS | Status: DC

## 2016-04-19 MED ORDER — ACETAMINOPHEN 325 MG PO TABS
650.0000 mg | ORAL_TABLET | Freq: Four times a day (QID) | ORAL | Status: DC | PRN
  Administered 2016-04-19: 650 mg via ORAL
  Filled 2016-04-19: qty 2

## 2016-04-19 MED ORDER — SODIUM CHLORIDE 0.9 % IV SOLN
INTRAVENOUS | Status: AC
  Administered 2016-04-19: 05:00:00 via INTRAVENOUS

## 2016-04-19 MED ORDER — METOCLOPRAMIDE HCL 10 MG PO TABS
10.0000 mg | ORAL_TABLET | Freq: Every day | ORAL | Status: DC
  Administered 2016-04-19: 10 mg via ORAL
  Filled 2016-04-19: qty 1

## 2016-04-19 MED ORDER — TOPIRAMATE 100 MG PO TABS: 100.0000 mg | ORAL_TABLET | Freq: Every day | ORAL | Status: DC

## 2016-04-19 MED ORDER — CLONAZEPAM 1 MG PO TABS: 1.0000 mg | ORAL_TABLET | Freq: Three times a day (TID) | ORAL | Status: DC | PRN

## 2016-04-19 MED ORDER — POTASSIUM CHLORIDE CRYS ER 20 MEQ PO TBCR
20.0000 meq | EXTENDED_RELEASE_TABLET | Freq: Once | ORAL | Status: AC
Start: 2016-04-19 — End: 2016-04-19
  Administered 2016-04-19: 20 meq via ORAL
  Filled 2016-04-19: qty 1

## 2016-04-19 MED ORDER — SODIUM CHLORIDE 0.9 % IV BOLUS (SEPSIS)
1000.0000 mL | Freq: Once | INTRAVENOUS | Status: AC
  Administered 2016-04-19: 1000 mL via INTRAVENOUS

## 2016-04-19 NOTE — Discharge Summary (Addendum)
Physician Discharge Summary  Amanda [name] ZOX:[id number] DOB: August 27, 1960 DOA: 04/18/2016  PCP: Allean Found, MD  Admit date: 04/18/2016 Discharge date: 04/19/2016  Admitted From: Home Disposition: Home   Recommendations for Outpatient Follow-up:  1. Follow up with neurology, Dr. Anne Hahn is scheduled for follow up of acute encephalopathy as well as poorly-controlled migraine management. 2. Consider decreasing topamax due to concern for encephalopathy 3. Follow up with PCP in 1-2 weeks 4. Please obtain TSH in 6 weeks (just under normal limit during hospitalization) 5. Please follow up on the following pending results: 1. Blood cultures drawn 9/25  Home Health: None recommended Equipment/Devices: None  Discharge Condition: Stable CODE STATUS: Full Diet recommendation: Heart healthy.  Brief/Interim Summary: Amanda [name] is a 55 y.o. female with chronic migraines mostly bedridden over the last 3 years because of the migraines was noticed to have difficulty selecting words with confusion on 9/24. Patient's husband states that the whole day patient was sleeping, which is not atypical for her, and around 10 PM patient had difficulty bringing out words, looked confused and was having sweating spells. Patient was taken to the ER and CT head was unremarkable. Due to persistent symptoms patient was admitted for further management of expressive aphasia with confusion.   On exam in the ED, patient appeared confused and still has difficulty bringing out words. CT head was negative. Urine drug screen was positive for opiates (patient takes Percocet) and benzodiazepine (patient takes Klonopin). UA and chest x-ray unremarkable, patient was afebrile. MRI of the brain was unremarkable and EEG showed moderate diffuse slowing of the waking background. Medications were held and patient's mental status rapidly cleared back to baseline per her husband.   On further discussion with the patient and  family, she's felt abnormal/drowsy for several months with associated word blocking, around the time topamax dose was increased. This is the presumptive etiology of chronic word blocking, and since the patient showed evidence of mild dehydration, this could have caused acute worsening which improved with IV fluids and holding the medication.   Since patient had sweating spells with labs showing elevated lactate and leukocytosis blood cultures were collected  Discharge Diagnoses:  Principal Problem:   Acute encephalopathy Active Problems:   Hyperglycemia  Discharge Instructions Discharge Instructions    Ambulatory referral to Neurology    Complete by:  As directed    Discharge instructions    Complete by:  As directed    You were admitted for confusion and altered mental status. This has fortunately improved on its own. Work up including EEG, CT of your head, MRI of the brain, and lab work did not show a definitive cause of your symptoms. Because these symptoms improved spontaneously after holding medications, it's possible that the medications you are taking are all together causing the problem.  - To avoid a recurrence of this, try to minimize your use of klonopin and follow up with Dr. Anne Hahn next Thursday at 8:30am. - If you experience, or your family notices, any symptoms that worsen or you develop trouble speaking, or localized weakness or numbness you should return to [facility] Emergency Department.       Medication List    STOP taking these medications   venlafaxine XR 75 MG 24 hr capsule Commonly known as:  EFFEXOR-XR     TAKE these medications   aspirin-acetaminophen-caffeine 250-250-65 MG tablet Commonly known as:  EXCEDRIN MIGRAINE Take 2 tablets by mouth every 6 (six) hours as needed for headache or migraine.  buPROPion 150 MG 24 hr tablet Commonly known as:  WELLBUTRIN XL Take 450 mg by mouth daily.   CALCIUM 1200 PO Take 1,200 mg by mouth 2 (two) times daily.    clonazePAM 1 MG tablet Commonly known as:  KLONOPIN TAKE 1&1/2 TABLETS BY MOUTH AT BEDTIME What changed:  See the new instructions.   FLUoxetine 40 MG capsule Commonly known as:  PROZAC Take 40 mg by mouth daily.   gabapentin 300 MG capsule Commonly known as:  NEURONTIN Take 900 mg by mouth at bedtime.   Magnesium 400 MG Caps Take 400 mg by mouth 2 (two) times daily.   ondansetron 4 MG tablet Commonly known as:  ZOFRAN Take 8 mg by mouth every 8 (eight) hours as needed for nausea or vomiting.   promethazine 25 MG tablet Commonly known as:  PHENERGAN Take 1 tablet (25 mg total) by mouth every 6 (six) hours as needed for nausea or vomiting.   SUMAtriptan 100 MG tablet Commonly known as:  IMITREX Take 1 tablet (100 mg total) by mouth as needed for migraine. What changed:  how much to take   topiramate 50 MG tablet Commonly known as:  TOPAMAX 1 tab in the am, 2 tabs at night What changed:  how much to take  how to take this  when to take this  additional instructions      Follow-up Information    Lesly Dukes, MD Follow up on 04/29/2016.   Specialty:  Neurology Why:  8:30am Contact information: [address] [place] [address] [phone]        Allean Found, MD. Schedule an appointment as soon as possible for a visit in 2 week(s).   Specialty:  Family Medicine Contact information: [phone] [place] [address] [phone]          Allergies  Allergen Reactions  . Aspirin Other (See Comments)    Makes her ears ring    Consultations:  None  Procedures/Studies: Dg Chest 2 View  Result Date: 04/19/2016 CLINICAL DATA:  Altered mental status, lethargy, expressive aphasia. Chronic migraines. EXAM: CHEST  2 VIEW COMPARISON:  None. FINDINGS: The heart size and mediastinal contours are within normal limits. Both lungs are clear. The visualized skeletal structures are unremarkable. IMPRESSION:  Normal chest radiograph. Electronically Signed   By: Awilda Metro M.D.   On: 04/19/2016 00:35   Ct Head Wo Contrast  Result Date: 04/19/2016 CLINICAL DATA:  Altered mental status, lethargy, ataxia, slurred speech and expressive aphasia. History of chronic migraines. EXAM: CT HEAD WITHOUT CONTRAST TECHNIQUE: Contiguous axial images were obtained from the base of the skull through the vertex without intravenous contrast. COMPARISON:  CT HEAD April 05, 2011 FINDINGS: BRAIN: The ventricles and sulci are normal. No intraparenchymal hemorrhage, mass effect nor midline shift. No acute large vascular territory infarcts. No abnormal extra-axial fluid collections. Basal cisterns are patent. VASCULAR: Mild calcific atherosclerosis of the carotid siphons. SKULL/SOFT TISSUES: No skull fracture. Mild RIGHT, moderate LEFT temporomandibular osteoarthrosis. No significant soft tissue swelling. ORBITS/SINUSES: The included ocular globes and orbital contents are normal.The mastoid aircells and included paranasal sinuses are well-aerated. OTHER: None. IMPRESSION: Negative CT HEAD. Electronically Signed   By: Awilda Metro M.D.   On: 04/19/2016 00:30   Mr Brain Wo Contrast  Result Date: 04/19/2016 CLINICAL DATA:  55 y/o F; history of chronic migraines presenting with confusion and word finding difficulties. EXAM: MRI HEAD WITHOUT CONTRAST TECHNIQUE: Multiplanar, multiecho pulse sequences of the  brain and surrounding structures were obtained without intravenous contrast. COMPARISON:  04/18/2016 and 04/05/2011 CT head. FINDINGS: Brain: No acute infarction, hemorrhage, hydrocephalus, extra-axial collection or mass lesion. T1/T2 intermediate to hyperintense focus within the occipital bone of the posterior fossa just inferior torcula measuring 6 mm (series 9, image 10) with diffusion restriction Vascular: Normal flow voids. Skull and upper cervical spine: T1/T2 intermediate to hyperintense focus within the occipital bone  of the posterior fossa just inferior torcula measuring 6 mm (series 9, image 10) with diffusion restriction and smooth bony lucency on CT, stable from 2012 consistent with benign etiology, possibly a hemangioma or meningioma. Sinuses/Orbits: Negative. Other: None. IMPRESSION: No acute intracranial abnormality.  Unremarkable MRI of the brain. Electronically Signed   By: Mitzi HansenLance  Furusawa-Stratton M.D.   On: 04/19/2016 13:57    EEG 04/19/2016 ELECTROENCEPHALOGRAM REPORT  Date of Study: 04/19/2016  Patient's Name: Valera CastleBeth E Mcmanamon MRN: [id number] Date of Birth: [birth year]  Referring Provider: Dr. Midge MiniumArshad Kakrakandy  Clinical History: This is a 55 year old woman with confusion.   Medications: topiramate (TOPAMAX) tablet 100 mg           topiramate (TOPAMAX) tablet 50 mg  clonazePAM (KLONOPIN) tablet 1.5 mg        acetaminophen (TYLENOL) tablet 650 mg     ibuprofen (ADVIL,MOTRIN) tablet 600 mg     magnesium oxide (MAG-OX) tablet 400 mg   metoCLOPramide (REGLAN) tablet 10 mg    montelukast (SINGULAIR) tablet 10 mg         promethazine (PHENERGAN) tablet 25 mg   venlafaxine XR (EFFEXOR-XR) 24 hr capsule 225 mg          Technical Summary: A multichannel digital EEG recording measured by the international 10-20 system with electrodes applied with paste and impedances below 5000 ohms performed as portable with EKG monitoring in an awake and asleep patient.  Hyperventilation and photic stimulation were not performed.  The digital EEG was referentially recorded, reformatted, and digitally filtered in a variety of bipolar and referential montages for optimal display.   Description: The patient is awake and asleep during the recording.  During maximal wakefulness, there is a poorly sustained medium voltage 7 Hz posterior dominant rhythm that poorly attenuates with eye opening and eye closure. This is admixed with a moderate amount of diffuse 4-5 Hz theta and 2-3 Hz delta slowing of the waking  background.  During drowsiness and sleep, there is an increase in theta and delta slowing of the background.  Vertex waves and symmetric sleep spindles were seen.  Hyperventilation and photic stimulation were not performed.  There were no epileptiform discharges or electrographic seizures seen.    EKG lead was unremarkable.  Impression: This awake and asleep EEG is abnormal due to moderate diffuse slowing of the waking background.  Clinical Correlation of the above findings indicates diffuse cerebral dysfunction that is non-specific in etiology and can be seen with hypoxic/ischemic injury, toxic/metabolic encephalopathies, neurodegenerative disorders, or medication effect.  The absence of epileptiform discharges does not rule out a clinical diagnosis of epilepsy.  Clinical correlation is advised.  Subjective: Patient still has minor blocking which has been usual for her in the past few months. She is oriented and pt's husband reports she has returned slowly to baseline. They want to go home.   Discharge Exam: Vitals:   04/19/16 1600 04/19/16 1720  BP: 114/79 127/74  Pulse:  89  Resp: 16 19  Temp: 98.1 F (36.7 C) 98.6 F (37 C)  Vitals:   04/19/16 1230 04/19/16 1400 04/19/16 1600 04/19/16 1720  BP: 114/72 120/65 114/79 127/74  Pulse: 88   89  Resp: 16 20 16 19   Temp: 98.1 F (36.7 C) 98.2 F (36.8 C) 98.1 F (36.7 C) 98.6 F (37 C)  TempSrc: Oral Oral Oral Oral  SpO2: 98% 100% 100% 100%  Weight:      Height:       General: Pt is alert, awake, not in acute distress Cardiovascular: RRR, S1/S2 +, no rubs, no gallops Respiratory: CTA bilaterally, no wheezing, no rhonchi Abdominal: Soft, NT, ND, bowel sounds + Extremities: no edema, no cyanosis Neuro: Alert, oriented. No focal sensorimotor deficits in any extremity or cranial nerves. Speech exhibits intermittent blocking without aphasia or dysphasia.  The results of significant diagnostics from this hospitalization  (including imaging, microbiology, ancillary and laboratory) are listed below for reference.    Microbiology: Recent Results (from the past 240 hour(s))  Culture, blood (routine x 2)     Status: None (Preliminary result)   Collection Time: 04/19/16  6:42 AM  Result Value Ref Range Status   Specimen Description BLOOD LEFT ARM  Final   Special Requests IN PEDIATRIC BOTTLE 4CC  Final   Culture NO GROWTH 1 DAY  Final   Report Status PENDING  Incomplete  Culture, blood (routine x 2)     Status: None (Preliminary result)   Collection Time: 04/19/16  6:46 AM  Result Value Ref Range Status   Specimen Description BLOOD LEFT ARM  Final   Special Requests IN PEDIATRIC BOTTLE 4CC  Final   Culture NO GROWTH 1 DAY  Final   Report Status PENDING  Incomplete     Labs: Basic Metabolic Panel:  Recent Labs Lab 04/18/16 2320 04/19/16 0642  NA 144 140  K 3.8 3.3*  CL 109 110  CO2 24 23  GLUCOSE 190* 144*  BUN 13 10  CREATININE 1.10* 0.84  CALCIUM 9.2 8.4*   Liver Function Tests:  Recent Labs Lab 04/18/16 2320 04/19/16 0642  AST 29 31  ALT 13* 13*  ALKPHOS 78 54  BILITOT 0.5 0.4  PROT 7.6 6.1*  ALBUMIN 4.3 3.3*    Recent Labs Lab 04/19/16 0642  AMMONIA 41*   CBC:  Recent Labs Lab 04/18/16 2320 04/19/16 0642  WBC 15.0* 14.1*  NEUTROABS  --  12.7*  HGB 15.4* 13.0  HCT 47.0* 40.8  MCV 98.1 98.6  PLT 359 289   Thyroid function studies  Recent Labs  04/19/16 0642  TSH 0.322*   Urinalysis    Component Value Date/Time   COLORURINE YELLOW 04/18/2016 2328   APPEARANCEUR CLEAR 04/18/2016 2328   LABSPEC 1.018 04/18/2016 2328   PHURINE 5.5 04/18/2016 2328   GLUCOSEU NEGATIVE 04/18/2016 2328   HGBUR SMALL (A) 04/18/2016 2328   BILIRUBINUR SMALL (A) 04/18/2016 2328   KETONESUR 15 (A) 04/18/2016 2328   PROTEINUR NEGATIVE 04/18/2016 2328   NITRITE NEGATIVE 04/18/2016 2328   LEUKOCYTESUR NEGATIVE 04/18/2016 2328   Time coordinating discharge: Over 30 minutes  Hazeline Junker, MD  [facility] 04/20/2016, 6:26 PM Pager [phone]  If 7PM-7AM, please contact night-coverage [email or web address] Password [email or web address]

## 2016-04-19 NOTE — Discharge Instructions (Signed)
Transfer to [facility] 

## 2016-04-19 NOTE — Progress Notes (Signed)
Patient is discharged from room 5M20 at this time. Alert and in stable condition. IV site d/c'd as well as tele. Instructions read to patient and husband with understanding verbalized. Ambulate out of unit with all belongings and husband at side.

## 2016-04-19 NOTE — Procedures (Signed)
ELECTROENCEPHALOGRAM REPORT  Date of Study: 04/19/2016  Patient's Name: Amanda KaufmannBeth E Aleman MRN: [id number] Date of Birth: Dec 22, 1960  Referring Provider: Dr. Midge MiniumArshad Kakrakandy  Clinical History: This is a 55 year old woman with confusion.   Medications: topiramate (TOPAMAX) tablet 100 mg  topiramate (TOPAMAX) tablet 50 mg  clonazePAM (KLONOPIN) tablet 1.5 mg  acetaminophen (TYLENOL) tablet 650 mg  ibuprofen (ADVIL,MOTRIN) tablet 600 mg  magnesium oxide (MAG-OX) tablet 400 mg  metoCLOPramide (REGLAN) tablet 10 mg  montelukast (SINGULAIR) tablet 10 mg  promethazine (PHENERGAN) tablet 25 mg  venlafaxine XR (EFFEXOR-XR) 24 hr capsule 225 mg   Technical Summary: A multichannel digital EEG recording measured by the international 10-20 system with electrodes applied with paste and impedances below 5000 ohms performed as portable with EKG monitoring in an awake and asleep patient.  Hyperventilation and photic stimulation were not performed.  The digital EEG was referentially recorded, reformatted, and digitally filtered in a variety of bipolar and referential montages for optimal display.   Description: The patient is awake and asleep during the recording.  During maximal wakefulness, there is a poorly sustained medium voltage 7 Hz posterior dominant rhythm that poorly attenuates with eye opening and eye closure. This is admixed with a moderate amount of diffuse 4-5 Hz theta and 2-3 Hz delta slowing of the waking background.  During drowsiness and sleep, there is an increase in theta and delta slowing of the background.  Vertex waves and symmetric sleep spindles were seen.  Hyperventilation and photic stimulation were not performed.  There were no epileptiform discharges or electrographic seizures seen.    EKG lead was unremarkable.  Impression: This awake and asleep EEG is abnormal due to moderate diffuse slowing of the waking background.  Clinical Correlation of the above findings  indicates diffuse cerebral dysfunction that is non-specific in etiology and can be seen with hypoxic/ischemic injury, toxic/metabolic encephalopathies, neurodegenerative disorders, or medication effect.  The absence of epileptiform discharges does not rule out a clinical diagnosis of epilepsy.  Clinical correlation is advised.   Patrcia DollyKaren Aquino, M.D.

## 2016-04-19 NOTE — Evaluation (Signed)
Physical Therapy Evaluation Patient Details Name: Amanda KaufmannBeth E [name] MRN: [id number] DOB: [birth year] Today's Date: 04/19/2016   History of Present Illness  Amanda [name] is a 55 y.o. female with chronic migraines mostly bedridden over the last 3 years because of the migraines was noticed to have difficulty bringing out words with confusion and chills last night around 10 PM.  Clinical Impression  Patient presents close to her functional baseline.  Though she demonstrates balance deficits she has ongoing issues with migraines at home and reports she does furniture walk when having migraines so feels current balance deficits are at her baseline and is not interested in PT follow up.  Will sign off as pt reports no further needs for skilled PT at this time.     Follow Up Recommendations No PT follow up    Equipment Recommendations  None recommended by PT    Recommendations for Other Services       Precautions / Restrictions Precautions Precautions: Fall      Mobility  Bed Mobility Overal bed mobility: Modified Independent                Transfers Overall transfer level: Needs assistance   Transfers: Sit to/from Stand Sit to Stand: Supervision         General transfer comment: for safety, slightly impulsive  Ambulation/Gait Ambulation/Gait assistance: Min guard Ambulation Distance (Feet): 200 Feet Assistive device: None Gait Pattern/deviations: Step-through pattern;Drifts right/left     General Gait Details: drifts to R/L when turning head, slows down looking up, no LOB on turns  Stairs            Wheelchair Mobility    Modified Rankin (Stroke Patients Only) Modified Rankin (Stroke Patients Only) Pre-Morbid Rankin Score: Moderate disability Modified Rankin: Moderately severe disability     Balance Overall balance assessment: Needs assistance   Sitting balance-Leahy Scale: Good     Standing balance support: No upper extremity  supported Standing balance-Leahy Scale: Fair Standing balance comment: decreased safety with dynamic balance challenges                             Pertinent Vitals/Pain Pain Assessment: Faces Pain Location: headache Pain Descriptors / Indicators: Headache (chronic) Pain Intervention(s): Monitored during session;Limited activity within patient's tolerance    Home Living Family/patient expects to be discharged to:: Private residence Living Arrangements: Spouse/significant other Available Help at Discharge: Available PRN/intermittently;Family Type of Home: House Home Access: Stairs to enter   Secretary/administratorntrance Stairs-Number of Steps: 1 Home Layout: One level Home Equipment: None      Prior Function Level of Independence: Needs assistance   Gait / Transfers Assistance Needed: independent in the home, but mostly stays in bed due to chronic migraines  ADL's / Homemaking Assistance Needed: showers independent, but not when feeling bad  Comments: does not do housework or cooking     Hand Dominance        Extremity/Trunk Assessment   Upper Extremity Assessment: Generalized weakness           Lower Extremity Assessment: Overall WFL for tasks assessed      Cervical / Trunk Assessment: Other exceptions  Communication   Communication: Expressive difficulties (word finding trouble)  Cognition Arousal/Alertness: Awake/alert Behavior During Therapy: WFL for tasks assessed/performed Overall Cognitive Status: Impaired/Different from baseline Area of Impairment: Orientation;Following commands Orientation Level: Place;Situation     Following Commands: Follows multi-step commands with increased time  General Comments: stated at [facility], but reminded she was transferred and able to recall at [facility] with increased time; stated she was brought to hosptial because her husband thought she was "wheezy" (took extra time to come up with that word.)    General  Comments General comments (skin integrity, edema, etc.): Patient and spouse report pt is close to her baseline, reports still with some word finding trouble (which she has been having for 3 months,) and was not putting sentences together well this morning, but feels she is functioning at her baseline.  States more unsteady when having a migraine, but then reports she has them all the time    Exercises     Assessment/Plan    PT Assessment Patent does not need any further PT services  PT Problem List            PT Treatment Interventions      PT Goals (Current goals can be found in the Care Plan section)  Acute Rehab PT Goals PT Goal Formulation: All assessment and education complete, DC therapy    Frequency     Barriers to discharge        Co-evaluation               End of Session Equipment Utilized During Treatment: Gait belt Activity Tolerance: Patient limited by pain Patient left: in bed;with call bell/phone within reach;with family/visitor present      Functional Assessment Tool Used: Clinical Judgement Functional Limitation: Mobility: Walking and moving around Mobility: Walking and Moving Around Current Status (W0981): At least 1 percent but less than 20 percent impaired, limited or restricted Mobility: Walking and Moving Around Goal Status [phone]): At least 1 percent but less than 20 percent impaired, limited or restricted    Time: 1525-1551 PT Time Calculation (min) (ACUTE ONLY): 26 min   Charges:   PT Evaluation $PT Eval Moderate Complexity: 1 Procedure PT Treatments $Gait Training: 8-22 mins   PT G Codes:   PT G-Codes **NOT FOR INPATIENT CLASS** Functional Assessment Tool Used: Clinical Judgement Functional Limitation: Mobility: Walking and moving around Mobility: Walking and Moving Around Current Status (W2956): At least 1 percent but less than 20 percent impaired, limited or restricted Mobility: Walking and Moving Around Goal Status [phone]): At  least 1 percent but less than 20 percent impaired, limited or restricted    Amanda [name] 04/19/2016, 4:40 PM  Sheran Lawless, Rafael Hernandez [phone] 04/19/2016

## 2016-04-19 NOTE — ED Notes (Signed)
Pt oxygen saturation 99% on 1 L. Oxygen removed completely.

## 2016-04-19 NOTE — Progress Notes (Signed)
EEG completed, results pending. 

## 2016-04-19 NOTE — H&P (Signed)
History and Physical    Amanda CastleBeth E [name] AVW:[id number] DOB: 11/04/1960 DOA: 04/18/2016  PCP: Allean FoundSMITH,CANDACE THIELE, MD  Patient coming from: Transferred from [facility] [place].  Chief Complaint: Confusion. Difficulty speaking.  History obtained from patient's husband.  HPI: Amanda [name] is a 55 y.o. female with chronic migraines mostly bedridden over the last 3 years because of the migraines was noticed to have difficulty bringing out words with confusion and chills last night around 10 PM. Patient's husband states that the whole day patient was sleeping and last night around 10 PM patient had difficulty bringing out words looked confused and was having sweating spells. Since then patient has been getting confused and has not improved. Patient was taken to the ER and CT head was unremarkable. Due to persistent symptoms patient has been admitted for further management of expressive aphasia with confusion. As per the patient's husband patient's medications has not been recently changed. Urine drug screen is positive for opiates and benzodiazepine which patient does take. On exam patient appears confused and still has difficulty bringing out words.   ED Course: CT head was negative. Urine drug screen was positive for opiates (patient takes Percocet) and benzodiazepine (patient takes Klonopin). UA and chest x-ray unremarkable and patient is afebrile.  Review of Systems: As per HPI, rest all negative.   Past Medical History:  Diagnosis Date  . Depression   . History of total hysterectomy   . Migraine with intractable migraine   . Restless leg syndrome     History reviewed. No pertinent surgical history.   reports that she has never smoked. She has never used smokeless tobacco. She reports that she drinks alcohol. She reports that she does not use drugs.  Allergies  Allergen Reactions  . Aspirin     Makes her ears ring    Family History  Problem Relation Age of Onset  .  Dementia Mother   . Lung cancer Father   . Migraines Sister   . Migraines Sister   . Migraines Sister   . Hypothyroidism Sister   . Hypothyroidism Sister   . Hypothyroidism Sister     Prior to Admission medications   Medication Sig Start Date End Date Taking? Authorizing Provider  Calcium Carbonate-Vit D-Min (CALCIUM 1200 PO) Take 1,200 mg by mouth.    Historical Provider, MD  clonazePAM (KLONOPIN) 1 MG tablet TAKE 1&1/2 TABLETS BY MOUTH AT BEDTIME 06/10/13   York Spanielharles K Willis, MD  Magnesium 400 MG CAPS Take 400 mg by mouth daily.    Historical Provider, MD  metoCLOPramide (REGLAN) 10 MG tablet Take 10 mg by mouth daily.    Historical Provider, MD  montelukast (SINGULAIR) 10 MG tablet Take 10 mg by mouth at bedtime.    Historical Provider, MD  promethazine (PHENERGAN) 25 MG tablet Take 1 tablet (25 mg total) by mouth every 6 (six) hours as needed for nausea or vomiting. 06/06/13   Nilda RiggsNancy Carolyn Martin, NP  SUMAtriptan (IMITREX) 100 MG tablet Take 1 tablet (100 mg total) by mouth as needed for migraine. 06/06/13   Nilda RiggsNancy Carolyn Martin, NP  topiramate (TOPAMAX) 50 MG tablet 1 tab in the am, 2 tabs at night 06/06/13   Nilda RiggsNancy Carolyn Martin, NP  venlafaxine XR (EFFEXOR-XR) 75 MG 24 hr capsule TAKE 3 CAPSULES BY MOUTH EVERY DAY 11/22/12   York Spanielharles K Willis, MD    Physical Exam: Vitals:   04/19/16 0245 04/19/16 0258 04/19/16 0300 04/19/16 0415  BP:   112/70 107/64  Pulse: 91  92 90  Resp: 19  24 20   Temp:  97.8 F (36.6 C)  98.4 F (36.9 C)  TempSrc:    Oral  SpO2: 96%  97% 96%  Weight:    154 lb 5.2 oz (70 kg)  Height:    5\' 7"  (1.702 m)      Constitutional: Not in distress. Vitals:   04/19/16 0245 04/19/16 0258 04/19/16 0300 04/19/16 0415  BP:   112/70 107/64  Pulse: 91  92 90  Resp: 19  24 20   Temp:  97.8 F (36.6 C)  98.4 F (36.9 C)  TempSrc:    Oral  SpO2: 96%  97% 96%  Weight:    154 lb 5.2 oz (70 kg)  Height:    5\' 7"  (1.702 m)   Eyes: Anicteric no pallor. PERRLA  positive. ENMT: No discharge from the ears eyes nose and mouth. Neck: No neck rigidity. No mass felt. Respiratory: No rhonchi or crepitations. Cardiovascular: S1 and S2 heard. Abdomen: Soft nontender bowel sounds present. Musculoskeletal: No edema. Skin: No rash. Neurologic: Alert awake oriented to her name. Moves all extremities but difficult to say the exact strengths course patient is not cooperating. No facial asymmetry. Tongue is midline. PERRLA positive. Psychiatric: Appears confused.   Labs on Admission: I have personally reviewed following labs and imaging studies  CBC:  Recent Labs Lab 04/18/16 2320  WBC 15.0*  HGB 15.4*  HCT 47.0*  MCV 98.1  PLT 359   Basic Metabolic Panel:  Recent Labs Lab 04/18/16 2320  NA 144  K 3.8  CL 109  CO2 24  GLUCOSE 190*  BUN 13  CREATININE 1.10*  CALCIUM 9.2   GFR: Estimated Creatinine Clearance: 55.5 mL/min (by C-G formula based on SCr of 1.1 mg/dL (H)). Liver Function Tests:  Recent Labs Lab 04/18/16 2320  AST 29  ALT 13*  ALKPHOS 78  BILITOT 0.5  PROT 7.6  ALBUMIN 4.3   No results for input(s): LIPASE, AMYLASE in the last 168 hours. No results for input(s): AMMONIA in the last 168 hours. Coagulation Profile: No results for input(s): INR, PROTIME in the last 168 hours. Cardiac Enzymes: No results for input(s): CKTOTAL, CKMB, CKMBINDEX, TROPONINI in the last 168 hours. BNP (last 3 results) No results for input(s): PROBNP in the last 8760 hours. HbA1C: No results for input(s): HGBA1C in the last 72 hours. CBG: No results for input(s): GLUCAP in the last 168 hours. Lipid Profile: No results for input(s): CHOL, HDL, LDLCALC, TRIG, CHOLHDL, LDLDIRECT in the last 72 hours. Thyroid Function Tests: No results for input(s): TSH, T4TOTAL, FREET4, T3FREE, THYROIDAB in the last 72 hours. Anemia Panel: No results for input(s): VITAMINB12, FOLATE, FERRITIN, TIBC, IRON, RETICCTPCT in the last 72 hours. Urine analysis:     Component Value Date/Time   COLORURINE YELLOW 04/18/2016 2328   APPEARANCEUR CLEAR 04/18/2016 2328   LABSPEC 1.018 04/18/2016 2328   PHURINE 5.5 04/18/2016 2328   GLUCOSEU NEGATIVE 04/18/2016 2328   HGBUR SMALL (A) 04/18/2016 2328   BILIRUBINUR SMALL (A) 04/18/2016 2328   KETONESUR 15 (A) 04/18/2016 2328   PROTEINUR NEGATIVE 04/18/2016 2328   NITRITE NEGATIVE 04/18/2016 2328   LEUKOCYTESUR NEGATIVE 04/18/2016 2328   Sepsis Labs: @LABRCNTIP (procalcitonin:4,lacticidven:4) )No results found for this or any previous visit (from the past 240 hour(s)).   Radiological Exams on Admission: Dg Chest 2 View  Result Date: 04/19/2016 CLINICAL DATA:  Altered mental status, lethargy, expressive aphasia. Chronic migraines. EXAM: CHEST  2 VIEW  COMPARISON:  None. FINDINGS: The heart size and mediastinal contours are within normal limits. Both lungs are clear. The visualized skeletal structures are unremarkable. IMPRESSION: Normal chest radiograph. Electronically Signed   By: Awilda Metro M.D.   On: 04/19/2016 00:35   Ct Head Wo Contrast  Result Date: 04/19/2016 CLINICAL DATA:  Altered mental status, lethargy, ataxia, slurred speech and expressive aphasia. History of chronic migraines. EXAM: CT HEAD WITHOUT CONTRAST TECHNIQUE: Contiguous axial images were obtained from the base of the skull through the vertex without intravenous contrast. COMPARISON:  CT HEAD April 05, 2011 FINDINGS: BRAIN: The ventricles and sulci are normal. No intraparenchymal hemorrhage, mass effect nor midline shift. No acute large vascular territory infarcts. No abnormal extra-axial fluid collections. Basal cisterns are patent. VASCULAR: Mild calcific atherosclerosis of the carotid siphons. SKULL/SOFT TISSUES: No skull fracture. Mild RIGHT, moderate LEFT temporomandibular osteoarthrosis. No significant soft tissue swelling. ORBITS/SINUSES: The included ocular globes and orbital contents are normal.The mastoid aircells and  included paranasal sinuses are well-aerated. OTHER: None. IMPRESSION: Negative CT HEAD. Electronically Signed   By: Awilda Metro M.D.   On: 04/19/2016 00:30    EKG: Independently reviewed. Normal sinus rhythm.  Assessment/Plan Principal Problem:   Acute encephalopathy Active Problems:   Hyperglycemia    1. Acute encephalopathy with expressive aphasia - cause not clear. Patient is afebrile. I have reviewed patient's medications with patient's husband. There was no recent change in medications. Since patient had sweating spells with labs showing elevated lactate and leukocytosis we will check blood cultures. Get MRI of the brain to rule out stroke. Check ammonia levels, EEG. Continue with hydration. Discussed with Dr. Roseanne Reno on-call neurologist. 2. Hyperglycemia - check hemoglobin A1c. 3. History of migraine - since patient is confused, at this time patient is not able to exactly say the severity of the presence of headache. Continue home medications.   DVT prophylaxis: SCDs. Code Status: Full code.  Family Communication: Patient's husband.  Disposition Plan: Home.  Consults called: None. Discussed with neurologist.  Admission status: Observation.    Eduard Clos MD [facility] Pager [phone].  If 7PM-7AM, please contact night-coverage [email or web address] Password [email or web address]  04/19/2016, 6:08 AM

## 2016-04-19 NOTE — Care Management Note (Signed)
Case Management Note  Patient Details  Name: Josem KaufmannBeth E Swarm MRN: [id number] Date of Birth: [birth year]  Subjective/Objective:    Pt in with acute encephalopathy. She is from home with her husband.                 Action/Plan: Awaiting PT/OT recommendations. CM following for discharge disposition.   Expected Discharge Date:   (Pending)               Expected Discharge Plan:     In-House Referral:     Discharge planning Services     Post Acute Care Choice:    Choice offered to:     DME Arranged:    DME Agency:     HH Arranged:    HH Agency:     Status of Service:  In process, will continue to follow  If discussed at Long Length of Stay Meetings, dates discussed:    Additional Comments:  Kermit BaloKelli F Sanvika Cuttino, RN 04/19/2016, 11:33 AM

## 2016-04-20 DIAGNOSIS — G43809 Other migraine, not intractable, without status migrainosus: Secondary | ICD-10-CM

## 2016-04-24 LAB — CULTURE, BLOOD (ROUTINE X 2)
CULTURE: NO GROWTH
Culture: NO GROWTH

## 2016-04-28 DIAGNOSIS — G9340 Encephalopathy, unspecified: Secondary | ICD-10-CM | POA: Diagnosis not present

## 2016-04-28 DIAGNOSIS — R946 Abnormal results of thyroid function studies: Secondary | ICD-10-CM | POA: Diagnosis not present

## 2016-04-29 ENCOUNTER — Ambulatory Visit (INDEPENDENT_AMBULATORY_CARE_PROVIDER_SITE_OTHER): Payer: BLUE CROSS/BLUE SHIELD | Admitting: Neurology

## 2016-04-29 ENCOUNTER — Encounter: Payer: Self-pay | Admitting: Neurology

## 2016-04-29 VITALS — BP 92/64 | HR 50 | Ht 67.5 in | Wt 156.0 lb

## 2016-04-29 DIAGNOSIS — G2581 Restless legs syndrome: Secondary | ICD-10-CM | POA: Diagnosis not present

## 2016-04-29 DIAGNOSIS — G43511 Persistent migraine aura without cerebral infarction, intractable, with status migrainosus: Secondary | ICD-10-CM | POA: Diagnosis not present

## 2016-04-29 MED ORDER — QUETIAPINE FUMARATE 25 MG PO TABS: 50.0000 mg | ORAL_TABLET | Freq: Every day | ORAL | 3 refills | Status: DC

## 2016-04-29 MED ORDER — TOPIRAMATE 50 MG PO TABS: ORAL_TABLET | ORAL | Status: DC

## 2016-04-29 NOTE — Progress Notes (Signed)
Reason for visit: Migraine headache  Referring physician: [facility] E Mcclean is a 55 y.o. female  History of present illness:  Ms. Kump is a 55 year old right-handed white female with a history of intractable migraine headache. This patient was last seen through this office about 3 years ago, she has been followed by Dr. Vela Prose for her migraine since that time. The patient has been on Topamax, she believes that she is taking 200 mg at night. The patient has had some chronic issues with word finding on that dose of medication. The patient has had worsening of her headaches over the last 2 years, the patient has only about 5 or 6 headache-free days a month. She spends most of her time in bed incapacitated with the headache. The patient has also suffered from depression, she sees a Veterinary surgeon for this. She takes Imitrex or Excedrin migraine for the headache with some benefit. The patient indicates that her headaches are generalized in nature unassociated with neck stiffness. The patient may occasionally have some visual complaints with blurring of vision. She may have nausea without vomiting, she gets photophobia and phonophobia. The patient denies any numbness or weakness of the face, arms, or legs. She states that weather changes, and certain foods such as chocolate, wine, bacon, onions, and ketchup may worsen the headache. The patient has been tried on Zonegran, Topamax, Depakote, and is currently on gabapentin without good benefit from the headache. The patient takes 2 antidepressant medications for her depression. She was in the hospital on 04/18/2016 with confusion, and with difficulty with language. MRI of the brain was done and was unremarkable. The patient has been sent to this office for further evaluation.  Past Medical History:  Diagnosis Date  . Depression   . History of total hysterectomy   . Migraine with intractable migraine   . Restless leg syndrome     Past  Surgical History:  Procedure Laterality Date  . TOTAL ABDOMINAL HYSTERECTOMY      Family History  Problem Relation Age of Onset  . Dementia Mother   . Lung cancer Father   . Migraines Sister   . Migraines Sister   . Migraines Sister   . Hypothyroidism Sister   . Hypothyroidism Sister   . Hypothyroidism Sister     Social history:  reports that she has never smoked. She has never used smokeless tobacco. She reports that she drinks alcohol. She reports that she does not use drugs.  Medications:  Prior to Admission medications   Medication Sig Start Date End Date Taking? Authorizing Provider  aspirin-acetaminophen-caffeine (EXCEDRIN MIGRAINE) [phone] MG tablet Take 2 tablets by mouth every 6 (six) hours as needed for headache or migraine.   Yes Historical Provider, MD  buPROPion (WELLBUTRIN XL) 150 MG 24 hr tablet Take 450 mg by mouth daily. 03/23/16  Yes Historical Provider, MD  Calcium Carbonate-Vit D-Min (CALCIUM 1200 PO) Take 1,200 mg by mouth 2 (two) times daily.    Yes Historical Provider, MD  clonazePAM (KLONOPIN) 1 MG tablet TAKE 1&1/2 TABLETS BY MOUTH AT BEDTIME Patient taking differently: Take 1mg  by mouth 3 times daily as needed for anxiety 06/10/13  Yes York Spaniel, MD  FLUoxetine (PROZAC) 40 MG capsule Take 40 mg by mouth daily.   Yes Historical Provider, MD  gabapentin (NEURONTIN) 300 MG capsule Take 900 mg by mouth at bedtime.   Yes Historical Provider, MD  Magnesium 400 MG CAPS Take 400 mg by mouth 2 (  two) times daily.    Yes Historical Provider, MD  metoCLOPramide (REGLAN) 10 MG tablet  04/28/16  Yes Historical Provider, MD  ondansetron (ZOFRAN) 4 MG tablet Take 8 mg by mouth every 8 (eight) hours as needed for nausea or vomiting.   Yes Historical Provider, MD  promethazine (PHENERGAN) 25 MG tablet Take 1 tablet (25 mg total) by mouth every 6 (six) hours as needed for nausea or vomiting. 06/06/13  Yes Nilda RiggsNancy Carolyn Martin, NP  SUMAtriptan (IMITREX) 100 MG tablet  Take 1 tablet (100 mg total) by mouth as needed for migraine. Patient taking differently: Take 100-200 mg by mouth as needed for migraine.  06/06/13  Yes Nilda RiggsNancy Carolyn Martin, NP  topiramate (TOPAMAX) 50 MG tablet 1 tab in the am, 2 tabs at night Patient taking differently: Take 100 mg by mouth at bedtime.  06/06/13  Yes Nilda RiggsNancy Carolyn Martin, NP      Allergies  Allergen Reactions  . Aspirin Other (See Comments)    Makes her ears ring    ROS:  Out of a complete 14 system review of symptoms, the patient complains only of the following symptoms, and all other reviewed systems are negative.  Restless legs Headache, speech difficulty Depression  Blood pressure 92/64, pulse (!) 50, height 5' 7.5" (1.715 m), weight 156 lb (70.8 kg).  Physical Exam  General: The patient is alert and cooperative at the time of the examination.  Eyes: Pupils are equal, round, and reactive to light. Discs are flat bilaterally.  Neck: The neck is supple, no carotid bruits are noted.  Respiratory: The respiratory examination is clear.  Cardiovascular: The cardiovascular examination reveals a regular rate and rhythm, no obvious murmurs or rubs are noted.  Skin: Extremities are without significant edema.  Neurologic Exam  Mental status: The patient is alert and oriented x 3 at the time of the examination. The patient has apparent normal recent and remote memory, with an apparently normal attention span and concentration ability.  Cranial nerves: Facial symmetry is present. There is good sensation of the face to pinprick and soft touch bilaterally. The strength of the facial muscles and the muscles to head turning and shoulder shrug are normal bilaterally. Speech is well enunciated, no aphasia or dysarthria is noted. Extraocular movements are full. Visual fields are full. The tongue is midline, and the patient has symmetric elevation of the soft palate. No obvious hearing deficits are noted.  Motor: The  motor testing reveals 5 over 5 strength of all 4 extremities. Good symmetric motor tone is noted throughout.  Sensory: Sensory testing is intact to pinprick, soft touch, vibration sensation, and position sense on all 4 extremities. No evidence of extinction is noted.  Coordination: Cerebellar testing reveals good finger-nose-finger and heel-to-shin bilaterally.  Gait and station: Gait is normal. Tandem gait is normal. Romberg is negative. No drift is seen.  Reflexes: Deep tendon reflexes are symmetric and normal bilaterally. Toes are downgoing bilaterally.   Assessment/Plan:  1. Intractable migraine headache  2. Restless leg syndrome  The patient has had severe problems with headaches over the last 2 years. In the past, Topamax has been beneficial. She has had some problems with word finding on the medication. We will drop the dose to 150 mg at night, Seroquel will be added in the evening. I have recommended that she consider Botox therapy, but the patient indicates that she cannot afford the medication. The patient will follow-up in 3 months.  Marlan Palau. Keith Willis MD 04/29/2016 8:41 AM  [facility] [address] [place], [place] [address]  Phone [phone] Fax [phone]

## 2016-04-29 NOTE — Patient Instructions (Signed)
   With the Seroquel 25 mg tablet at night for 2 weeks, then take 2 tablets at night

## 2016-05-03 ENCOUNTER — Telehealth: Payer: Self-pay | Admitting: Neurology

## 2016-05-03 NOTE — Telephone Encounter (Signed)
Returned pt TC. Let her know that Dr. Anne HahnWIllis planned to decrease Topamax due to side effects. Seroquel was added while on lower dose of Topamax for further prevention of HAs. Verbalized understanding and appreciation for call.

## 2016-05-03 NOTE — Telephone Encounter (Signed)
Patient called to ask why Dr. Anne HahnWillis put her on QUEtiapine (SEROQUEL) 25 MG tablet for migraines?

## 2016-05-11 DIAGNOSIS — F331 Major depressive disorder, recurrent, moderate: Secondary | ICD-10-CM | POA: Diagnosis not present

## 2016-05-25 ENCOUNTER — Emergency Department (HOSPITAL_BASED_OUTPATIENT_CLINIC_OR_DEPARTMENT_OTHER): Payer: BLUE CROSS/BLUE SHIELD

## 2016-05-25 ENCOUNTER — Encounter (HOSPITAL_BASED_OUTPATIENT_CLINIC_OR_DEPARTMENT_OTHER): Payer: Self-pay

## 2016-05-25 ENCOUNTER — Emergency Department (HOSPITAL_BASED_OUTPATIENT_CLINIC_OR_DEPARTMENT_OTHER)
Admission: EM | Admit: 2016-05-25 | Discharge: 2016-05-25 | Disposition: A | Discharge status: Home / Self Care | Payer: BLUE CROSS/BLUE SHIELD | Attending: Physician Assistant | Admitting: Physician Assistant

## 2016-05-25 DIAGNOSIS — J181 Lobar pneumonia, unspecified organism: Secondary | ICD-10-CM | POA: Diagnosis not present

## 2016-05-25 DIAGNOSIS — J189 Pneumonia, unspecified organism: Secondary | ICD-10-CM | POA: Diagnosis not present

## 2016-05-25 DIAGNOSIS — R079 Chest pain, unspecified: Secondary | ICD-10-CM | POA: Diagnosis not present

## 2016-05-25 DIAGNOSIS — Z7982 Long term (current) use of aspirin: Secondary | ICD-10-CM | POA: Diagnosis not present

## 2016-05-25 LAB — CBC WITH DIFFERENTIAL/PLATELET
BASOS ABS: 0 10*3/uL (ref 0.0–0.1)
BASOS PCT: 0 %
EOS ABS: 0 10*3/uL (ref 0.0–0.7)
Eosinophils Relative: 0 %
HCT: 39.4 % (ref 36.0–46.0)
Hemoglobin: 13.4 g/dL (ref 12.0–15.0)
Lymphocytes Relative: 8 %
Lymphs Abs: 0.9 10*3/uL (ref 0.7–4.0)
MCH: 32 pg (ref 26.0–34.0)
MCHC: 34 g/dL (ref 30.0–36.0)
MCV: 94 fL (ref 78.0–100.0)
MONO ABS: 1.1 10*3/uL — AB (ref 0.1–1.0)
MONOS PCT: 10 %
Neutro Abs: 9 10*3/uL — ABNORMAL HIGH (ref 1.7–7.7)
Neutrophils Relative %: 82 %
PLATELETS: 357 10*3/uL (ref 150–400)
RBC: 4.19 MIL/uL (ref 3.87–5.11)
RDW: 12.5 % (ref 11.5–15.5)
WBC: 11.1 10*3/uL — ABNORMAL HIGH (ref 4.0–10.5)

## 2016-05-25 LAB — COMPREHENSIVE METABOLIC PANEL
ALBUMIN: 3.8 g/dL (ref 3.5–5.0)
ALK PHOS: 84 U/L (ref 38–126)
ALT: 8 U/L — AB (ref 14–54)
ANION GAP: 11 (ref 5–15)
AST: 13 U/L — ABNORMAL LOW (ref 15–41)
BILIRUBIN TOTAL: 0.7 mg/dL (ref 0.3–1.2)
BUN: 9 mg/dL (ref 6–20)
CALCIUM: 9 mg/dL (ref 8.9–10.3)
CO2: 19 mmol/L — AB (ref 22–32)
CREATININE: 0.93 mg/dL (ref 0.44–1.00)
Chloride: 107 mmol/L (ref 101–111)
GFR calc non Af Amer: >60 mL/min (ref >60)
GLUCOSE: 97 mg/dL (ref 65–99)
Potassium: 3.5 mmol/L (ref 3.5–5.1)
SODIUM: 137 mmol/L (ref 135–145)
TOTAL PROTEIN: 7.5 g/dL (ref 6.5–8.1)

## 2016-05-25 LAB — TROPONIN I

## 2016-05-25 LAB — D-DIMER, QUANTITATIVE (NOT AT ARMC): D DIMER QUANT: 0.29 ug{FEU}/mL (ref 0.00–0.50)

## 2016-05-25 MED ORDER — AZITHROMYCIN 250 MG PO TABS: 250.0000 mg | ORAL_TABLET | Freq: Once | ORAL | 0 refills | Status: AC

## 2016-05-25 MED ORDER — METOCLOPRAMIDE HCL 5 MG/ML IJ SOLN
10.0000 mg | Freq: Once | INTRAMUSCULAR | Status: AC
  Administered 2016-05-25: 10 mg via INTRAVENOUS
  Filled 2016-05-25: qty 2

## 2016-05-25 MED ORDER — DIPHENHYDRAMINE HCL 50 MG/ML IJ SOLN
25.0000 mg | Freq: Once | INTRAMUSCULAR | Status: AC
  Administered 2016-05-25: 25 mg via INTRAVENOUS
  Filled 2016-05-25: qty 1

## 2016-05-25 MED ORDER — AZITHROMYCIN 250 MG PO TABS
500.0000 mg | ORAL_TABLET | Freq: Once | ORAL | Status: AC
  Administered 2016-05-25: 500 mg via ORAL
  Filled 2016-05-25: qty 2

## 2016-05-25 MED ORDER — KETOROLAC TROMETHAMINE 30 MG/ML IJ SOLN
15.0000 mg | Freq: Once | INTRAMUSCULAR | Status: AC
  Administered 2016-05-25: 15 mg via INTRAVENOUS
  Filled 2016-05-25: qty 1

## 2016-05-25 NOTE — ED Triage Notes (Signed)
CP x 3 days-NAD-steady gait 

## 2016-05-25 NOTE — ED Provider Notes (Signed)
[facility] Provider Note   CSN: [id number] Arrival date & time: 05/25/16  1759  By signing my name below, I, Soijett Blue, attest that this documentation has been prepared under the direction and in the presence of Adaisha Campise Lyn Janara Klett, MD. Electronically Signed: Soijett Blue, ED Scribe. 05/25/16. 6:40 PM.  History   Chief Complaint Chief Complaint  Patient presents with  . Chest Pain    HPI  Amanda [name] is a 55 y.o. female who presents to the Emergency Department complaining of 10/10, sharp, CP onset 3 days. Pt notes that her CP woke her up in the middle of the night and she had a searing pain underneath her left breast. Pt denies the sensation feeling heavy or sharp, and notes that the CP doesn't radiate to her jaw or down her left arm. Pt notes that she came into the ED tonight due to her CP radiating to her back. Pt states that her CP is worse with holding her breast. Pt denies alleviating factors for her CP. Husband notes that the pt was seen in September 2017 for stroke-like symptoms and was admitted to the hospital with her MRI being negative. Pt reports that she came into the ED tonight to rule out a possible PE, but is unable to explain why she would like to rule a PE out. Pt husband notes that the pt remains indoors and is immobile majority of the time due to chronic migraines and that is the reason they would like to be evaluated for a possible PE. Pt has a hard time describing her pain or natural hx of events and she is aided by her husband to answer providers questions. 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 She states that she is having associated symptoms of left breast pain. She states that she has not tried any medications for the relief of her symptoms. She denies leg swelling, SOB, and any other symptoms. Pt denies recent travel at this time.     The history is provided by the patient and the spouse. No language interpreter was used.    Past  Medical History:  Diagnosis Date  . Depression   . History of total hysterectomy   . Migraine with intractable migraine   . Restless leg syndrome     Patient Active Problem List   Diagnosis Date Noted  . Migraine aura, persistent, intractable, with status migrainosus 04/29/2016  . Other migraine without status migrainosus, not intractable   . Altered mental status 04/19/2016  . Acute encephalopathy 04/19/2016  . Hyperglycemia 04/19/2016  . Migraine without aura, with intractable migraine, so stated, without mention of status migrainosus 12/04/2012  . Headache(784.0) 12/04/2012  . Restless legs syndrome (RLS) 12/04/2012    Past Surgical History:  Procedure Laterality Date  . TOTAL ABDOMINAL HYSTERECTOMY      OB History    No data available       Home Medications    Prior to Admission medications   Medication Sig Start Date End Date Taking? Authorizing Provider  aspirin-acetaminophen-caffeine (EXCEDRIN MIGRAINE) [phone] MG tablet Take 2 tablets by mouth every 6 (six) hours as needed for headache or migraine.    Historical Provider, MD  azithromycin (ZITHROMAX Z-PAK) 250 MG tablet Take 1 tablet (250 mg total) by mouth once. 05/25/16 05/25/16  Adyline Huberty Lyn Rumaldo Difatta, MD  buPROPion (WELLBUTRIN XL) 150 MG 24 hr tablet Take 450 mg by mouth daily. 03/23/16   Historical Provider, MD  Calcium Carbonate-Vit D-Min (CALCIUM  1200 PO) Take 1,200 mg by mouth 2 (two) times daily.     Historical Provider, MD  clonazePAM (KLONOPIN) 1 MG tablet TAKE 1&1/2 TABLETS BY MOUTH AT BEDTIME Patient taking differently: Take 1mg  by mouth 3 times daily as needed for anxiety 06/10/13   York Spaniel, MD  FLUoxetine (PROZAC) 40 MG capsule Take 40 mg by mouth daily.    Historical Provider, MD  gabapentin (NEURONTIN) 300 MG capsule Take 900 mg by mouth at bedtime.    Historical Provider, MD  Magnesium 400 MG CAPS Take 400 mg by mouth 2 (two) times daily.     Historical Provider, MD  metoCLOPramide  (REGLAN) 10 MG tablet  04/28/16   Historical Provider, MD  ondansetron (ZOFRAN) 4 MG tablet Take 8 mg by mouth every 8 (eight) hours as needed for nausea or vomiting.    Historical Provider, MD  promethazine (PHENERGAN) 25 MG tablet Take 1 tablet (25 mg total) by mouth every 6 (six) hours as needed for nausea or vomiting. 06/06/13   Nilda Riggs, NP  QUEtiapine (SEROQUEL) 25 MG tablet Take 2 tablets (50 mg total) by mouth at bedtime. 04/29/16   York Spaniel, MD  SUMAtriptan (IMITREX) 100 MG tablet Take 1 tablet (100 mg total) by mouth as needed for migraine. Patient taking differently: Take 100-200 mg by mouth as needed for migraine.  06/06/13   Nilda Riggs, NP  topiramate (TOPAMAX) 50 MG tablet 3 tablets at night 04/29/16   York Spaniel, MD    Family History Family History  Problem Relation Age of Onset  . Dementia Mother   . Lung cancer Father   . Migraines Sister   . Migraines Sister   . Migraines Brother   . Hypothyroidism Sister   . Hypothyroidism Sister   . Hypothyroidism Sister     Social History Social History  Substance Use Topics  . Smoking status: Never Smoker  . Smokeless tobacco: Never Used  . Alcohol use Yes     Comment: rarely     Allergies   Aspirin   Review of Systems Review of Systems  All other systems reviewed and are negative.    Physical Exam Updated Vital Signs BP 117/66 (BP Location: Right Arm)   Pulse 100   Temp 98.7 F (37.1 C) (Oral)   Resp 16   Ht 5\' 8"  (1.727 m)   Wt 150 lb (68 kg)   SpO2 97%   BMI 22.81 kg/m   Physical Exam  Constitutional: She is oriented to person, place, and time. She appears well-developed and well-nourished. No distress.  HENT:  Head: Normocephalic and atraumatic.  Eyes: EOM are normal.  Neck: Neck supple.  Cardiovascular: Normal rate, regular rhythm and normal heart sounds.  Exam reveals no gallop and no friction rub.   No murmur heard. Pulmonary/Chest: Effort normal and breath  sounds normal. No respiratory distress. She has no wheezes. She has no rales.  Abdominal: Soft. She exhibits no distension. There is no tenderness.  Musculoskeletal: Normal range of motion.  Neurological: She is alert and oriented to person, place, and time.  Skin: Skin is warm and dry.  Psychiatric: Her behavior is normal.  Strange affect.  Nursing note and vitals reviewed.    ED Treatments / Results  DIAGNOSTIC STUDIES: Oxygen Saturation is 97% on RA, nl by my interpretation.    COORDINATION OF CARE: 6:26 PM Discussed treatment plan with pt at bedside which includes EKG, CXR, labs, and pt agreed to  plan.   Labs (all labs ordered are listed, but only abnormal results are displayed) Labs Reviewed  COMPREHENSIVE METABOLIC PANEL - Abnormal; Notable for the following:       Result Value   CO2 19 (*)    AST 13 (*)    ALT 8 (*)    All other components within normal limits  CBC WITH DIFFERENTIAL/PLATELET - Abnormal; Notable for the following:    WBC 11.1 (*)    Neutro Abs 9.0 (*)    Monocytes Absolute 1.1 (*)    All other components within normal limits  D-DIMER, QUANTITATIVE (NOT AT [facility])  TROPONIN I    EKG  EKG Interpretation  Date/Time:  Tuesday May 25 2016 18:09:17 EDT Ventricular Rate:  113 PR Interval:    QRS Duration: 82 QT Interval:  472 QTC Calculation: 647 R Axis:   77 Text Interpretation:  T wave flattening inveresion diffusely Confirmed by Kandis MannanMACKUEN, COURTNEY ([id number]) on 05/25/2016 6:14:58 PM       Radiology Dg Chest 2 View  Result Date: 05/25/2016 CLINICAL DATA:  Chest pain EXAM: CHEST  2 VIEW COMPARISON:  04/18/2016 FINDINGS: Interval development of left upper lobe density anteriorly measuring approximately 3 cm. This has irregular margins but was not present previously therefore not likely to be tumor but may represent pneumonia. Interval development of small left effusion Right lung remains clear. Heart size normal. Negative for heart failure.  IMPRESSION: 3 cm left upper lobe nodular density which is new from 1 month ago. Probable pneumonia. Small left effusion. Followup PA and lateral chest X-ray is recommended in 3-4 weeks following trial of antibiotic therapy to ensure resolution and exclude underlying malignancy. Electronically Signed   By: Marlan Palauharles  Clark M.D.   On: 05/25/2016 18:56    Procedures Procedures (including critical care time)  Medications Ordered in ED Medications  azithromycin (ZITHROMAX) tablet 500 mg (500 mg Oral Given 05/25/16 1928)  diphenhydrAMINE (BENADRYL) injection 25 mg (25 mg Intravenous Given 05/25/16 1930)  ketorolac (TORADOL) 30 MG/ML injection 15 mg (15 mg Intravenous Given 05/25/16 1929)  metoCLOPramide (REGLAN) injection 10 mg (10 mg Intravenous Given 05/25/16 1929)     Initial Impression / Assessment and Plan / ED Course  I have reviewed the triage vital signs and the nursing notes.  Pertinent labs & imaging results that were available during my care of the patient were reviewed by me and considered in my medical decision making (see chart for details).  Clinical Course    Patient is a 55 year old female with past medical history significant for migraines, chronic pain, anxiety, depression presenting today with difficult to explain chest pain. She reports it in her left breast and worse with movement. She says it's stabbing like pain. Patient has history of shingles. No evidence of shingles on exam. Will get chest x-ray EKG. Chest x-ray shows question pneumonia versus mass. We will treat for community acquired pneumonia and have patient follow up with her family physician and recheck a chest x-ray in 2 weeks.   Final Clinical Impressions(s) / ED Diagnoses   Final diagnoses:  Community acquired pneumonia of left lung, unspecified part of lung    New Prescriptions Discharge Medication List as of 05/25/2016  8:47 PM    START taking these medications   Details  azithromycin (ZITHROMAX  Z-PAK) 250 MG tablet Take 1 tablet (250 mg total) by mouth once., Starting Tue 05/25/2016, Print       I personally performed the services described in this documentation, which was  scribed in my presence. The recorded information has been reviewed and is accurate.       Dazha Kempa Randall AnLyn Reynoldo Mainer, MD 05/25/16 2258

## 2016-05-25 NOTE — Discharge Instructions (Signed)
You were seen with left pain in chest, found to have a pneumonia.  Please follow up with your regular phsyciain.

## 2016-06-08 ENCOUNTER — Ambulatory Visit
Admission: RE | Admit: 2016-06-08 | Discharge: 2016-06-08 | Disposition: A | Discharge status: Home / Self Care | Payer: BLUE CROSS/BLUE SHIELD | Source: Ambulatory Visit | Attending: Family Medicine | Admitting: Family Medicine

## 2016-06-08 ENCOUNTER — Other Ambulatory Visit: Payer: Self-pay | Admitting: Family Medicine

## 2016-06-08 DIAGNOSIS — J181 Lobar pneumonia, unspecified organism: Principal | ICD-10-CM

## 2016-06-08 DIAGNOSIS — J189 Pneumonia, unspecified organism: Secondary | ICD-10-CM | POA: Diagnosis not present

## 2016-06-08 DIAGNOSIS — R946 Abnormal results of thyroid function studies: Secondary | ICD-10-CM | POA: Diagnosis not present

## 2016-06-08 DIAGNOSIS — F331 Major depressive disorder, recurrent, moderate: Secondary | ICD-10-CM | POA: Diagnosis not present

## 2016-07-01 ENCOUNTER — Telehealth: Payer: Self-pay | Admitting: Neurology

## 2016-07-01 MED ORDER — GABAPENTIN 300 MG PO CAPS: 900.0000 mg | ORAL_CAPSULE | Freq: Every day | ORAL | 3 refills | Status: DC

## 2016-07-01 NOTE — Telephone Encounter (Signed)
Patient is calling to get a new Rx for gabapentin (NEURONTIN) 300 MG capsule [facility] [address] Dr. Clarisse GougeLewit had previously prescribed this medication.

## 2016-07-01 NOTE — Telephone Encounter (Signed)
I will call in the Rx for gabapentin.

## 2016-07-01 NOTE — Addendum Note (Signed)
Addended by: Stephanie AcreWILLIS, Mylo Choi on: 07/01/2016 05:11 PM   Modules accepted: Orders

## 2016-07-01 NOTE — Telephone Encounter (Signed)
Pt saw Dr. Anne HahnWillis in October for migraines and RLS. She's on Topamax and Seroquel was added at bedtime. Gabapentin was previously prescribed by Dr. Vela ProseLewitt. Pt has a follow-up appt scheduled w/ Megan NP in January.

## 2016-08-04 ENCOUNTER — Ambulatory Visit: Payer: BLUE CROSS/BLUE SHIELD | Admitting: Adult Health

## 2016-08-05 ENCOUNTER — Encounter: Payer: Self-pay | Admitting: Adult Health

## 2016-08-11 ENCOUNTER — Ambulatory Visit: Payer: BLUE CROSS/BLUE SHIELD | Admitting: Adult Health

## 2016-08-25 ENCOUNTER — Ambulatory Visit: Payer: BLUE CROSS/BLUE SHIELD | Admitting: Adult Health

## 2016-08-31 ENCOUNTER — Ambulatory Visit: Payer: BLUE CROSS/BLUE SHIELD | Admitting: Adult Health

## 2016-09-06 ENCOUNTER — Encounter: Payer: Self-pay | Admitting: Adult Health

## 2016-09-06 ENCOUNTER — Telehealth: Payer: Self-pay | Admitting: Adult Health

## 2016-09-06 ENCOUNTER — Ambulatory Visit (INDEPENDENT_AMBULATORY_CARE_PROVIDER_SITE_OTHER): Payer: BLUE CROSS/BLUE SHIELD | Admitting: Adult Health

## 2016-09-06 VITALS — BP 101/69 | HR 88 | Ht 68.0 in | Wt 151.2 lb

## 2016-09-06 DIAGNOSIS — G43019 Migraine without aura, intractable, without status migrainosus: Secondary | ICD-10-CM | POA: Diagnosis not present

## 2016-09-06 MED ORDER — TOPIRAMATE 50 MG PO TABS: 100.0000 mg | ORAL_TABLET | Freq: Every day | ORAL | 11 refills | Status: DC

## 2016-09-06 MED ORDER — ONDANSETRON HCL 4 MG PO TABS: 4.0000 mg | ORAL_TABLET | Freq: Three times a day (TID) | ORAL | 0 refills | Status: DC | PRN

## 2016-09-06 NOTE — Progress Notes (Signed)
I have read the note, and I agree with the clinical assessment and plan.  Anushree Dorsi KEITH   

## 2016-09-06 NOTE — Progress Notes (Signed)
PATIENT: Amanda [name] DOB: 1960/09/16  REASON FOR VISIT: follow up- migraine headache HISTORY FROM: patient  HISTORY OF PRESENT ILLNESS: [name] is a 56 year old female with a history of intractable migraine headache. She returns today for follow-up. She reports that the last visit she was incorrect when she told Dr. Anne HahnWillis her Topamax dose. She states that she was actually only taking 75 mg at bedtime. She reports that she has a migraine every day, all day long. She states that the headache is located all over her head. She does confirm photophobia and phonophobia. She does have nausea but no vomiting. Denies any visual disturbance. She is using Imitrex with no benefit. She is currently not employed. 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 She was given Seroquel by Dr. Anne HahnWillis however states that she cannot take this in conjunction with Reglan. In the past she has been tried on Zonegran, Topamax, Depakote, and is currently on gabapentin. She reports gabapentin works well for her restless leg symptoms. She returns today for an evaluation.  HISTORY 04/29/16: [name] is a 56 year old right-handed white female with a history of intractable migraine headache. This patient was last seen through this office about 3 years ago, she has been followed by Dr. Vela ProseLewitt for her migraine since that time. The patient has been on Topamax, she believes that she is taking 200 mg at night. The patient has had some chronic issues with word finding on that dose of medication. The patient has had worsening of her headaches over the last 2 years, the patient has only about 5 or 6 headache-free days a month. She spends most of her time in bed incapacitated with the headache. The patient has also suffered from depression, she sees a Veterinary surgeoncounselor for this. She takes Imitrex or Excedrin migraine for the headache with some benefit. The patient indicates that her headaches are  generalized in nature unassociated with neck stiffness. The patient may occasionally have some visual complaints with blurring of vision. She may have nausea without vomiting, she gets photophobia and phonophobia. The patient denies any numbness or weakness of the face, arms, or legs. She states that weather changes, and certain foods such as chocolate, wine, bacon, onions, and ketchup may worsen the headache. The patient has been tried on Zonegran, Topamax, Depakote, and is currently on gabapentin without good benefit from the headache. The patient takes 2 antidepressant medications for her depression. She was in the hospital on 04/18/2016 with confusion, and with difficulty with language. MRI of the brain was done and was unremarkable. The patient has been sent to this office for further evaluation  REVIEW OF SYSTEMS: Out of a complete 14 system review of symptoms, the patient complains only of the following symptoms, and all other reviewed systems are negative.  Restless leg, headache, depression  ALLERGIES: Allergies  Allergen Reactions  . Aspirin Other (See Comments)    Makes her ears ring    HOME MEDICATIONS: Outpatient Medications Prior to Visit  Medication Sig Dispense Refill  . aspirin-acetaminophen-caffeine (EXCEDRIN MIGRAINE) 250-250-65 MG tablet Take 2 tablets by mouth every 6 (six) hours as needed for headache or migraine.    Marland Kitchen. buPROPion (WELLBUTRIN XL) 150 MG 24 hr tablet Take 450 mg by mouth daily.  0  . Calcium Carbonate-Vit D-Min (CALCIUM 1200 PO) Take 1,200 mg by mouth 2 (two) times daily.     . clonazePAM (KLONOPIN) 1 MG tablet TAKE 1&1/2 TABLETS  BY MOUTH AT BEDTIME (Patient taking differently: Take 1mg  by mouth 3 times daily as needed for anxiety) 45 tablet 5  . FLUoxetine (PROZAC) 40 MG capsule Take 40 mg by mouth daily.    Marland Kitchen gabapentin (NEURONTIN) 300 MG capsule Take 3 capsules (900 mg total) by mouth at bedtime. 90 capsule 3  . Magnesium 400 MG CAPS Take 400 mg by mouth 2  (two) times daily.     . metoCLOPramide (REGLAN) 10 MG tablet   4  . ondansetron (ZOFRAN) 4 MG tablet Take 8 mg by mouth every 8 (eight) hours as needed for nausea or vomiting.    . promethazine (PHENERGAN) 25 MG tablet Take 1 tablet (25 mg total) by mouth every 6 (six) hours as needed for nausea or vomiting. 40 tablet 2  . QUEtiapine (SEROQUEL) 25 MG tablet Take 2 tablets (50 mg total) by mouth at bedtime. 60 tablet 3  . SUMAtriptan (IMITREX) 100 MG tablet Take 1 tablet (100 mg total) by mouth as needed for migraine. (Patient taking differently: Take 100-200 mg by mouth as needed for migraine. ) 9 tablet 5  . topiramate (TOPAMAX) 50 MG tablet 3 tablets at night     No facility-administered medications prior to visit.     PAST MEDICAL HISTORY: Past Medical History:  Diagnosis Date  . Depression   . History of total hysterectomy   . Migraine with intractable migraine   . Restless leg syndrome     PAST SURGICAL HISTORY: Past Surgical History:  Procedure Laterality Date  . TOTAL ABDOMINAL HYSTERECTOMY      FAMILY HISTORY: Family History  Problem Relation Age of Onset  . Dementia Mother   . Lung cancer Father   . Migraines Sister   . Migraines Sister   . Migraines Brother   . Hypothyroidism Sister   . Hypothyroidism Sister   . Hypothyroidism Sister     SOCIAL HISTORY: Social History   Social History  . Marital status: Married    Spouse name: Amanda [name]  . Number of children: 2  . Years of education: 14   Occupational History  . N/A    Social History Main Topics  . Smoking status: Never Smoker  . Smokeless tobacco: Never Used  . Alcohol use Yes     Comment: rarely  . Drug use: No  . Sexual activity: Not on file   Other Topics Concern  . Not on file   Social History Narrative   Patient lives at home with her husbandGreig [name]) and she is a Engineer, civil (consulting). The patient has 2 daughters.    Patient drinks very little caffeine, maybe one cup daily.P   Patient has a college  education.   Patient is currently not working.   Right-handed      PHYSICAL EXAM  Vitals:   09/06/16 1020  BP: 101/69  Pulse: 88  Weight: 151 lb 3.2 oz (68.6 kg)  Height: 5\' 8"  (1.727 m)   Body mass index is 22.99 kg/m.  Generalized: Well developed, in no acute distress   Neurological examination  Mentation: Alert oriented to time, place, history taking. Follows all commands speech and language fluent Cranial nerve II-XII: Pupils were equal round reactive to light. Extraocular movements were full, visual field were full on confrontational test. Facial sensation and strength were normal. Uvula tongue midline. Head turning and shoulder shrug  were normal and symmetric. Motor: The motor testing reveals 5 over 5 strength of all 4 extremities. Good symmetric motor tone is noted  throughout.  Sensory: Sensory testing is intact to soft touch on all 4 extremities. No evidence of extinction is noted.  Coordination: Cerebellar testing reveals good finger-nose-finger and heel-to-shin bilaterally.  Gait and station: Gait is normal. Tandem gait is normal. Romberg is negative. No drift is seen.   Reflexes: Deep tendon reflexes are symmetric and normal bilaterally.   DIAGNOSTIC DATA (LABS, IMAGING, TESTING) - I reviewed patient records, labs, notes, testing and imaging myself where available.  Lab Results  Component Value Date   WBC 11.1 (H) 05/25/2016   HGB 13.4 05/25/2016   HCT 39.4 05/25/2016   MCV 94.0 05/25/2016   PLT 357 05/25/2016      Component Value Date/Time   NA 137 05/25/2016 1840   K 3.5 05/25/2016 1840   CL 107 05/25/2016 1840   CO2 19 (L) 05/25/2016 1840   GLUCOSE 97 05/25/2016 1840   BUN 9 05/25/2016 1840   CREATININE 0.93 05/25/2016 1840   CALCIUM 9.0 05/25/2016 1840   PROT 7.5 05/25/2016 1840   ALBUMIN 3.8 05/25/2016 1840   AST 13 (L) 05/25/2016 1840   ALT 8 (L) 05/25/2016 1840   ALKPHOS 84 05/25/2016 1840   BILITOT 0.7 05/25/2016 1840   GFRNONAA >60  05/25/2016 1840   GFRAA >60 05/25/2016 1840    Lab Results  Component Value Date   HGBA1C 5.1 04/19/2016    Lab Results  Component Value Date   TSH 0.322 (L) 04/19/2016      ASSESSMENT AND PLAN 56 y.o. year old female  has a past medical history of Depression; History of total hysterectomy; Migraine with intractable migraine; and Restless leg syndrome. here with:  1. Migraine headache  The patient has tried multiple medications to treat her migraines. I have recommended Botox therapy however the patient states that she cannot afford this. I recommended that we retry some of the other medications. She states that she would like to increase Topamax as this has worked the best for her. She will increase to 100 mg at bedtime. She will continue on gabapentin for restless leg symptoms. I will refill Zofran for nausea today. Patient advised that if her symptoms worsen or she develops new symptoms she should let us know. She will follow-up in 6 months with Dr. Anne Hahn.  I spent 15 minutes with the patient 50% of this time was spent counseling the patient on treatment options.   Butch Penny, MSN, NP-C 09/06/2016, 10:16 AM [facility] [address], [address] [place], [place] [address] [phone]

## 2016-09-06 NOTE — Telephone Encounter (Signed)
Redid again as last prescription was no print.

## 2016-09-06 NOTE — Telephone Encounter (Signed)
Patient calling to get topiramate (TOPAMAX) 100  MG tablet [facility] [address] The pharmacy says does not have the Rx.

## 2016-09-06 NOTE — Addendum Note (Signed)
Addended byHermenia Fiscal: Aden Youngman on: 09/06/2016 05:07 PM   Modules accepted: Orders

## 2016-09-06 NOTE — Patient Instructions (Signed)
Increase Topamax to 100 mg at bedtime If your symptoms worsen or you develop new symptoms please let us know.   

## 2016-09-27 DIAGNOSIS — F331 Major depressive disorder, recurrent, moderate: Secondary | ICD-10-CM | POA: Diagnosis not present

## 2016-10-06 DIAGNOSIS — F331 Major depressive disorder, recurrent, moderate: Secondary | ICD-10-CM | POA: Diagnosis not present

## 2016-10-11 MED ORDER — PREDNISONE 5 MG PO TABS: ORAL_TABLET | ORAL | 0 refills | Status: DC

## 2016-10-11 NOTE — Telephone Encounter (Signed)
She has had no relief on the topamax 100mg  po qhs.  Would like to increase this.  Ok?

## 2016-10-11 NOTE — Telephone Encounter (Signed)
Pt called said she is still having daily HA and would like to increase topamax. Please call [phone]

## 2016-10-11 NOTE — Addendum Note (Signed)
Addended by: Enedina FinnerMILLIKAN, Brenton Joines P on: 10/11/2016 05:09 PM   Modules accepted: Orders

## 2016-10-11 NOTE — Telephone Encounter (Signed)
A prednisone Dosepak was sent in for the patient. I advised the patient to try this for 1 week if this does not break her cycle of daily headache and she should let us know. She will continue on Topamax 100 milligrams at bedtime. I reviewed the side effects of prednisone with the patient. She has taken prednisone in the past and tolerated it well.

## 2016-10-16 ENCOUNTER — Other Ambulatory Visit: Payer: Self-pay | Admitting: Adult Health

## 2016-10-17 ENCOUNTER — Other Ambulatory Visit: Payer: Self-pay | Admitting: Neurology

## 2016-10-29 ENCOUNTER — Telehealth: Payer: Self-pay | Admitting: Adult Health

## 2016-10-29 MED ORDER — SUMATRIPTAN SUCCINATE 100 MG PO TABS: ORAL_TABLET | ORAL | 5 refills | Status: DC

## 2016-10-29 NOTE — Addendum Note (Signed)
Addended by: Enedina Finner on: 10/29/2016 11:33 AM   Modules accepted: Orders

## 2016-10-29 NOTE — Telephone Encounter (Signed)
Pt called in for refill of SUMAtriptan (IMITREX) 100 MG tablet  She would still like to use  [facility] - [place], [place] - [address]

## 2016-10-29 NOTE — Telephone Encounter (Signed)
Refill sent.

## 2016-11-01 ENCOUNTER — Telehealth: Payer: Self-pay | Admitting: Adult Health

## 2016-11-01 NOTE — Telephone Encounter (Signed)
Patient called office to see is able to increase Topamax due to migraine.  Patient states she has had migraine since Friday night.  [facility] [address].  Please call

## 2016-11-01 NOTE — Telephone Encounter (Signed)
Per Wendie Agreste LVM advising patient to increase topamax to 125 mg nightly. Advised she do this x 2 weeks then call back with update. Advised her FU with Dr Anne Hahn may need to be moved sooner. Left number for any questions.

## 2016-11-01 NOTE — Telephone Encounter (Signed)
She can increase Topamax to 125 mg at bedtime. She may need a sooner f/u with Dr. Anne Hahn

## 2016-11-02 MED ORDER — TOPIRAMATE 25 MG PO TABS: 125.0000 mg | ORAL_TABLET | Freq: Every day | ORAL | 5 refills | Status: DC

## 2016-11-02 NOTE — Addendum Note (Signed)
Addended by: Enedina Finner on: 11/02/2016 04:18 PM   Modules accepted: Orders

## 2016-11-02 NOTE — Telephone Encounter (Signed)
Order changed.

## 2016-11-02 NOTE — Telephone Encounter (Signed)
Patient called office in reference to Topamax increaser to .  Per patient she currently has  tablets and she is unable to cut in half to receive the correct dosing of .  Please call

## 2016-11-03 NOTE — Telephone Encounter (Signed)
Spoke with patient and advised her new Rx has been sent. Advised it is for 25 mg tab, so she will have to take 5 tabs to reach prescribed dose of 125 mg nightly. She verbalized understanding, appreciation.

## 2016-12-09 ENCOUNTER — Other Ambulatory Visit: Payer: Self-pay | Admitting: Adult Health

## 2016-12-24 DIAGNOSIS — R9089 Other abnormal findings on diagnostic imaging of central nervous system: Secondary | ICD-10-CM | POA: Diagnosis not present

## 2016-12-24 DIAGNOSIS — R42 Dizziness and giddiness: Secondary | ICD-10-CM | POA: Diagnosis not present

## 2016-12-24 DIAGNOSIS — G43009 Migraine without aura, not intractable, without status migrainosus: Secondary | ICD-10-CM | POA: Diagnosis not present

## 2016-12-24 DIAGNOSIS — G44219 Episodic tension-type headache, not intractable: Secondary | ICD-10-CM | POA: Diagnosis not present

## 2017-01-03 DIAGNOSIS — K648 Other hemorrhoids: Secondary | ICD-10-CM | POA: Diagnosis not present

## 2017-02-05 ENCOUNTER — Other Ambulatory Visit: Payer: Self-pay | Admitting: Neurology

## 2017-03-08 ENCOUNTER — Telehealth: Payer: Self-pay | Admitting: Neurology

## 2017-03-08 ENCOUNTER — Ambulatory Visit: Payer: BLUE CROSS/BLUE SHIELD | Admitting: Neurology

## 2017-03-08 NOTE — Telephone Encounter (Signed)
This patient cancelled the same day of the appointment, she was sick.

## 2017-03-09 ENCOUNTER — Encounter: Payer: Self-pay | Admitting: Neurology

## 2017-03-11 DIAGNOSIS — G43909 Migraine, unspecified, not intractable, without status migrainosus: Secondary | ICD-10-CM | POA: Diagnosis not present

## 2017-04-06 ENCOUNTER — Encounter: Payer: Self-pay | Admitting: Adult Health

## 2017-04-06 ENCOUNTER — Ambulatory Visit (INDEPENDENT_AMBULATORY_CARE_PROVIDER_SITE_OTHER): Payer: BLUE CROSS/BLUE SHIELD | Admitting: Adult Health

## 2017-04-06 VITALS — BP 103/67 | HR 82 | Wt 153.4 lb

## 2017-04-06 DIAGNOSIS — G43019 Migraine without aura, intractable, without status migrainosus: Secondary | ICD-10-CM

## 2017-04-06 MED ORDER — GABAPENTIN 300 MG PO CAPS: 900.0000 mg | ORAL_CAPSULE | Freq: Every day | ORAL | 3 refills | Status: DC

## 2017-04-06 MED ORDER — TOPIRAMATE 50 MG PO TABS: 150.0000 mg | ORAL_TABLET | Freq: Every day | ORAL | 5 refills | Status: DC

## 2017-04-06 MED ORDER — ONDANSETRON HCL 8 MG PO TABS: ORAL_TABLET | ORAL | 0 refills | Status: DC

## 2017-04-06 NOTE — Progress Notes (Signed)
PATIENT: Amanda [name] DOB: [birth year]  REASON FOR VISIT: follow up HISTORY FROM: patient  HISTORY OF PRESENT ILLNESS: Today 04/06/17  Amanda [name] is a 56 year old female with a history intractable migraine headaches. She returns today for follow-up. At the last visit we increased Topamax 125 mg at bedtime. She reports that this has offered her some benefit. She states that her headaches are not less frequent but the intensity and the longevity has improved. She states that she essentially has 3-4 headaches a week that can last 2-3 days. I advised patient that it sounds as if she continues to have a headache daily. She states that her headaches are located all over her head. She does have photophobia and phonophobia as well as nausea and vomiting. She states that she has found that her shampoo was a trigger for her migraine. She reports that she stopped using that shampoo her migraines improved this week and she has not had one at all this week. I then asked the patient to clarify as she stated previously that she was essentially having a daily headache. She then states that despite that she stopped using that shampoo she feels she still has headache this week. The patient remains on Topamax 125 mg at bedtime, gabapentin 900 mg at bedtime and Zofran 4 mg for nausea. The patient is requesting that I increase Topamax as well as Zofran. She states in the past she has been on higher doses of both of these medications and tolerated it well.  Amanda [name] is a 56 year old female with a history of intractable migraine headache. She returns today for follow-up. She reports that the last visit she was incorrect when she told Dr. Anne HahnWillis her Topamax dose. She states that she was actually only taking 75 mg at bedtime. She reports that she has a migraine every day, all day long. She states that the headache is located all over her head. She does confirm photophobia and phonophobia. She does have nausea  but no vomiting. Denies any visual disturbance. She is using Imitrex with no benefit. She is currently not employed. 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 She was given Seroquel by Dr. Anne HahnWillis however states that she cannot take this in conjunction with Reglan. In the past she has been tried on Zonegran, Topamax, Depakote, and is currently on gabapentin. She reports gabapentin works well for her restless leg symptoms. She returns today for an evaluation.   REVIEW OF SYSTEMS: Out of a complete 14 system review of symptoms, the patient complains only of the following symptoms, and all other reviewed systems are negative.  See HPI  ALLERGIES: Allergies  Allergen Reactions  . Aspirin Other (See Comments)    Makes her ears ring    HOME MEDICATIONS: Outpatient Medications Prior to Visit  Medication Sig Dispense Refill  . aspirin-acetaminophen-caffeine (EXCEDRIN MIGRAINE) 250-250-65 MG tablet Take 2 tablets by mouth every 6 (six) hours as needed for headache or migraine.    Marland Kitchen. buPROPion (WELLBUTRIN XL) 150 MG 24 hr tablet Take 450 mg by mouth daily.  0  . Calcium Carbonate-Vit D-Min (CALCIUM 1200 PO) Take 1,200 mg by mouth 2 (two) times daily.     . clonazePAM (KLONOPIN) 1 MG tablet TAKE 1&1/2 TABLETS BY MOUTH AT BEDTIME (Patient taking differently: Take 1mg  by mouth 3 times daily as needed for anxiety) 45 tablet 5  . FLUoxetine (PROZAC) 40 MG capsule Take 40 mg by mouth daily.    .Marland Kitchen  gabapentin (NEURONTIN) 300 MG capsule TAKE THREE CAPSULES BY MOUTH AT BEDTIME 90 capsule 2  . Magnesium 400 MG CAPS Take 400 mg by mouth 2 (two) times daily.     . metoCLOPramide (REGLAN) 10 MG tablet   4  . ondansetron (ZOFRAN) 4 MG tablet TAKE ONE TABLET BY MOUTH EVERY 8 HOURS AS NEEDED FOR NAUSEA/ VOMITING 15 tablet 0  . SUMAtriptan (IMITREX) 100 MG tablet Take 1 tablet at onset of migraine. May repeat in 2 hours if needed. Not to exceed 2 tabs/24 hours. 9 tablet 5  . topiramate  (TOPAMAX) 25 MG tablet Take 5 tablets (125 mg total) by mouth at bedtime. 150 tablet 5  . predniSONE (DELTASONE) 5 MG tablet Begin taking 6 tablets daily, taper by one tablet daily until off the medication. 21 tablet 0  . QUEtiapine (SEROQUEL) 25 MG tablet Take 2 tablets (50 mg total) by mouth at bedtime. (Patient not taking: Reported on 09/06/2016) 60 tablet 3   No facility-administered medications prior to visit.     PAST MEDICAL HISTORY: Past Medical History:  Diagnosis Date  . Depression   . History of total hysterectomy   . Migraine with intractable migraine   . Restless leg syndrome     PAST SURGICAL HISTORY: Past Surgical History:  Procedure Laterality Date  . TOTAL ABDOMINAL HYSTERECTOMY      FAMILY HISTORY: Family History  Problem Relation Age of Onset  . Dementia Mother   . Lung cancer Father   . Migraines Sister   . Migraines Sister   . Migraines Brother   . Hypothyroidism Sister   . Hypothyroidism Sister   . Hypothyroidism Sister     SOCIAL HISTORY: Social History   Social History  . Marital status: Married    Spouse name: [name]  . Number of children: 2  . Years of education: 14   Occupational History  . N/A    Social History Main Topics  . Smoking status: Never Smoker  . Smokeless tobacco: Never Used  . Alcohol use Yes     Comment: rarely  . Drug use: No  . Sexual activity: Not on file   Other Topics Concern  . Not on file   Social History Narrative   Patient lives at home with her husbandGreig Castilla) and she is a Engineer, civil (consulting). The patient has 2 daughters.    Patient drinks very little caffeine, maybe one cup daily.P   Patient has a college education.   Patient is currently not working.   Right-handed      PHYSICAL EXAM  Vitals:   04/06/17 1053  BP: 103/67  Pulse: 82  Weight: 153 lb 6.4 oz (69.6 kg)   Body mass index is 23.32 kg/m.  Generalized: Well developed, in no acute distress   Neurological examination  Mentation: Alert  oriented to time, place, history taking. Follows all commands speech and language fluent Cranial nerve II-XII: Pupils were equal round reactive to light. Extraocular movements were full, visual field were full on confrontational test. Facial sensation and strength were normal. Uvula tongue midline. Head turning and shoulder shrug  were normal and symmetric. Motor: The motor testing reveals 5 over 5 strength of all 4 extremities. Good symmetric motor tone is noted throughout.  Sensory: Sensory testing is intact to soft touch on all 4 extremities. No evidence of extinction is noted.  Coordination: Cerebellar testing reveals good finger-nose-finger and heel-to-shin bilaterally.  Gait and station: Gait is normal. Tandem gait is normal. Romberg is negative.  No drift is seen.  Reflexes: Deep tendon reflexes are symmetric and normal bilaterally.   DIAGNOSTIC DATA (LABS, IMAGING, TESTING) - I reviewed patient records, labs, notes, testing and imaging myself where available.  Lab Results  Component Value Date   WBC 11.1 (H) 05/25/2016   HGB 13.4 05/25/2016   HCT 39.4 05/25/2016   MCV 94.0 05/25/2016   PLT 357 05/25/2016      Component Value Date/Time   NA 137 05/25/2016 1840   K 3.5 05/25/2016 1840   CL 107 05/25/2016 1840   CO2 19 (L) 05/25/2016 1840   GLUCOSE 97 05/25/2016 1840   BUN 9 05/25/2016 1840   CREATININE 0.93 05/25/2016 1840   CALCIUM 9.0 05/25/2016 1840   PROT 7.5 05/25/2016 1840   ALBUMIN 3.8 05/25/2016 1840   AST 13 (L) 05/25/2016 1840   ALT 8 (L) 05/25/2016 1840   ALKPHOS 84 05/25/2016 1840   BILITOT 0.7 05/25/2016 1840   GFRNONAA >60 05/25/2016 1840   GFRAA >60 05/25/2016 1840   No results found for: CHOL, HDL, LDLCALC, LDLDIRECT, TRIG, CHOLHDL Lab Results  Component Value Date   HGBA1C 5.1 04/19/2016    Lab Results  Component Value Date   TSH 0.322 (L) 04/19/2016      ASSESSMENT AND PLAN 56 y.o. year old female  has a past medical history of Depression;  History of total hysterectomy; Migraine with intractable migraine; and Restless leg syndrome. here with:  1. Migraine headache  The patient continues to have essentially a daily headache. I advised the patient that I would recommend Botox therapy however she reports that she is not interested due to potential side effects. I will increase Topamax to 150 mg at bedtime. She will continue on gabapentin 900 mg at bedtime. I will also increase Zofran to 8 mg to be taken daily as needed for nausea or vomiting. I did explain that gabapentin and Topamax can enhance the effects of central nervous system depression. I explained some of the symptoms may include drowsiness, confusion, difficulty walking and difficulty breathing. Patient voiced understanding. She again emphasized that she has taken those medications before and tolerated them well. She will return in 6 months for follow-up.     Butch Penny, MSN, NP-C 04/06/2017, 11:00 AM [facility] [address] [place], [place] [address] [phone]

## 2017-04-06 NOTE — Progress Notes (Signed)
I have read the note, and I agree with the clinical assessment and plan.  Alegandra Sommers KEITH   

## 2017-04-06 NOTE — Patient Instructions (Signed)
Your Plan:  Continue Gabapentin 900 mg at bedtime  Increase Topamax to 150 mg at bedtime  Zofran increased to 8 mg daily if needed for nausea and vomiting Please consider Botox therapy for migraine treatment Gabapentin and Topamax can cause Central Nervous System Depression when taking together- symptoms they may occur is drowsiness, confusion, difficulty walking, difficulty breathing.   [facility] you [facility] at [facility]. I hope we have been able to provide you high quality care today.  You may receive a patient satisfaction survey over the next few weeks. We would appreciate your feedback and comments so that we may continue to improve ourselves and the health of our patients.

## 2017-04-21 DIAGNOSIS — F331 Major depressive disorder, recurrent, moderate: Secondary | ICD-10-CM | POA: Diagnosis not present

## 2017-04-26 ENCOUNTER — Telehealth: Payer: Self-pay | Admitting: Adult Health

## 2017-04-26 NOTE — Telephone Encounter (Addendum)
Called [facility], spoke with Eulah Citizen and advised her that the patient stated the last refill of topamax she picked up had only #60 tabs in bottle, sig- take two tabs at bedtime. Advised Eulah Citizen this was refilled on 04/06/17, disp #90 tabs take three tabs at bedtime. Eulah Citizen checked, stated patient can come and pick up new Rx today, disp #90, take three tabs at bedtime.  Called patient, LVM informing her of above. Advised she call back for any questions, problems.

## 2017-04-26 NOTE — Telephone Encounter (Signed)
Patient called office in reference to topiramate (TOPAMAX) 50 MG tablet.  Patient states she picked up the medication on 04/06/17, but the instructions still state 2 tablets at bedtime not 3 tablets ( ).  She also only got 60 tablets.  [facility].  Please call patient if any questions.

## 2017-06-02 ENCOUNTER — Other Ambulatory Visit: Payer: Self-pay | Admitting: Adult Health

## 2017-08-12 ENCOUNTER — Other Ambulatory Visit: Payer: Self-pay | Admitting: Adult Health

## 2017-10-05 ENCOUNTER — Other Ambulatory Visit: Payer: Self-pay

## 2017-10-05 ENCOUNTER — Encounter: Payer: Self-pay | Admitting: Neurology

## 2017-10-05 ENCOUNTER — Ambulatory Visit: Payer: BLUE CROSS/BLUE SHIELD | Admitting: Neurology

## 2017-10-05 VITALS — BP 90/63 | HR 70 | Ht 68.0 in | Wt 160.5 lb

## 2017-10-05 DIAGNOSIS — G43511 Persistent migraine aura without cerebral infarction, intractable, with status migrainosus: Secondary | ICD-10-CM

## 2017-10-05 MED ORDER — ONDANSETRON HCL 8 MG PO TABS: ORAL_TABLET | ORAL | 3 refills | Status: DC

## 2017-10-05 MED ORDER — ERENUMAB-AOOE 70 MG/ML ~~LOC~~ SOAJ: 140.0000 mg | SUBCUTANEOUS | 4 refills | Status: DC

## 2017-10-05 NOTE — Patient Instructions (Signed)
We will try Aimovig for the headache. 

## 2017-10-05 NOTE — Progress Notes (Signed)
Reason for visit: Migraine headache  Amanda [name] is an 57 y.o. female  History of present illness:  Amanda [name] is a 57 year old right-handed white female with a history of intractable migraine headaches.  The patient indicates that she essentially has daily headaches. About 3 or 4 times a week the headaches become incapacitating and she is not able to function.  She has a lot of nausea, she takes Zofran for this.  She has gone up on the Topamax to 150 mg a day, she remains on gabapentin for the migraine and for restless leg syndrome.  She is on Prozac as well.  In the past, she has been on Depakote, Prozac, Effexor, she has tried Inderal but could not tolerate this due to hypotension.  She has been on Wellbutrin, Stadol, Frova, Celebrex, she currently is also on magnesium supplementation.  Nothing has helped her headaches previously.  She takes Reglan on a regular basis.  The headaches are essentially controlling her life at this point.  She returns to the office today for an evaluation.  Past Medical History:  Diagnosis Date  . Depression   . History of total hysterectomy   . Migraine with intractable migraine   . Restless leg syndrome     Past Surgical History:  Procedure Laterality Date  . TOTAL ABDOMINAL HYSTERECTOMY      Family History  Problem Relation Age of Onset  . Dementia Mother   . Lung cancer Father   . Migraines Sister   . Migraines Sister   . Migraines Brother   . Hypothyroidism Sister   . Hypothyroidism Sister   . Hypothyroidism Sister     Social history:  reports that  has never smoked. she has never used smokeless tobacco. She reports that she drinks alcohol. She reports that she does not use drugs.    Allergies  Allergen Reactions  . Wellbutrin [Bupropion]     GERD  . Aspirin Other (See Comments)    Makes her ears ring    Medications:  Prior to Admission medications   Medication Sig Start Date End Date Taking? Authorizing Provider    aspirin-acetaminophen-caffeine (EXCEDRIN MIGRAINE) [id number] MG tablet Take 2 tablets by mouth every 6 (six) hours as needed for headache or migraine.   Yes [provider]  Calcium Carbonate-Vit D-Min (CALCIUM 1200 PO) Take 1,200 mg by mouth 2 (two) times daily.    Yes [provider]  clonazePAM (KLONOPIN) 0.25 MG disintegrating tablet Take 0.5 mg by mouth at bedtime.   Yes [provider]  FLUoxetine (PROZAC) 40 MG capsule Take 40 mg by mouth daily.   Yes [provider]  gabapentin (NEURONTIN) 300 MG capsule TAKE THREE CAPSULES BY MOUTH EVERY NIGHT AT BEDTIME 08/15/17  Yes Butch PennyMillikan, Megan, NP  Magnesium 400 MG CAPS Take 400 mg by mouth 2 (two) times daily.    Yes [provider]  metoCLOPramide (REGLAN) 10 MG tablet  04/28/16  Yes [provider]  ondansetron (ZOFRAN) 8 MG tablet TAKE ONE TABLET BY MOUTH DAILY AS NEEDED FOR NAUSEA/ VOMITING 10/05/17  Yes York SpanielWillis, Mayu Ronk K, MD  ranitidine (ZANTAC) 75 MG tablet Take 75 mg by mouth 2 (two) times daily.   Yes [provider]  SUMAtriptan (IMITREX) 100 MG tablet TAKE 1 TABLET BY MOUTH AT ONSET OF HEADACHE MAY REPEAT IN 2 HOURS IF NEEDED, NOT TO EXCEED 2 TABLETS PER 24 HOURS 06/06/17  Yes York SpanielWillis, Sumaiyah Markert K, MD  topiramate (TOPAMAX) 50 MG tablet Take  3 tablets (150 mg total) by mouth at bedtime. 04/06/17  Yes Butch Penny, NP  Erenumab-aooe (AIMOVIG 140 DOSE) 70 MG/ML SOAJ Inject 140 mg into the skin every 30 (thirty) days. 10/05/17   York Spaniel, MD    ROS:  Out of a complete 14 system review of symptoms, the patient complains only of the following symptoms, and all other reviewed systems are negative.  Memory loss, numbness Depression Restless legs  Blood pressure 90/63, pulse 70, height 5\' 8"  (1.727 m), weight 160 lb 8 oz (72.8 kg).  Physical Exam  General: The patient is alert and cooperative at the time of the examination.  Skin: No significant peripheral edema is  noted.   Neurologic Exam  Mental status: The patient is alert and oriented x 3 at the time of the examination. The patient has apparent normal recent and remote memory, with an apparently normal attention span and concentration ability.   Cranial nerves: Facial symmetry is present. Speech is normal, no aphasia or dysarthria is noted. Extraocular movements are full. Visual fields are full.  Motor: The patient has good strength in all 4 extremities.  Sensory examination: Soft touch sensation is symmetric on the face, arms, and legs.  Coordination: The patient has good finger-nose-finger and heel-to-shin bilaterally.  Gait and station: The patient has a normal gait. Tandem gait is normal. Romberg is negative. No drift is seen.  Reflexes: Deep tendon reflexes are symmetric.   Assessment/Plan:  1.  Intractable migraine headache  2.  Restless leg syndrome  The patient was given a prescription for her Zofran, she will continue the Topamax and gabapentin.  The patient will be placed on Aimovig for the headaches.  If this is not effective, we will consider Botox therapy.  She will follow-up in 4 months.  Marlan Palau MD 10/05/2017 8:20 AM  [facility] [address] [place], [place] [address]  Phone [phone] Fax [phone]

## 2017-10-06 ENCOUNTER — Telehealth: Payer: Self-pay | Admitting: *Deleted

## 2017-10-06 DIAGNOSIS — F331 Major depressive disorder, recurrent, moderate: Secondary | ICD-10-CM | POA: Diagnosis not present

## 2017-10-06 NOTE — Telephone Encounter (Signed)
Initiated PA Aimovig on covermymeds. ZOX:WRUEAVKey:[id number]. In process of completing.

## 2017-10-06 NOTE — Telephone Encounter (Signed)
Submitted PA. PA Aimovig approved effective from 10/06/2017 through 01/03/2018.  Faxed notice of approval to [employer] at [phone]. Received fax confirmation.

## 2017-10-20 ENCOUNTER — Other Ambulatory Visit: Payer: Self-pay | Admitting: Neurology

## 2017-10-22 ENCOUNTER — Other Ambulatory Visit: Payer: Self-pay | Admitting: Adult Health

## 2017-10-30 ENCOUNTER — Other Ambulatory Visit: Payer: Self-pay | Admitting: Adult Health

## 2017-12-13 ENCOUNTER — Telehealth: Payer: Self-pay | Admitting: Neurology

## 2017-12-13 MED ORDER — ERENUMAB-AOOE 140 MG/ML ~~LOC~~ SOAJ: 140.0000 mg | SUBCUTANEOUS | 3 refills | Status: DC

## 2017-12-13 NOTE — Telephone Encounter (Signed)
Submitted PA on covermymeds. Waiting on determination. "Your information has been submitted to [facility]. [employer] [place] will review the request and fax you a determination directly, typically within 3 business days of your submission once all necessary information is received. If [employer] [place] has not responded in 3 business days or if you have any questions about your submission, contact [employer] [place] at [phone]"  Pt has tried/failed: topamax, gabapentin, prozax, wellbutrin, stadol, amerge, relpax, migranol, imitrex, zonegran, depakote, tylenol, ibuprofen, celebrex. Dx code: G57.511.

## 2017-12-13 NOTE — Telephone Encounter (Addendum)
PA initiated on covermymeds. Key: [id number]. In process of completing.

## 2017-12-13 NOTE — Telephone Encounter (Signed)
Pt called she has been advised Aimovig needs PA per [facility]. Last injection was 4/15. Please call to advise

## 2017-12-13 NOTE — Addendum Note (Signed)
Addended by: Hillis Range on: 12/13/2017 12:38 PM   Modules accepted: Orders

## 2017-12-15 NOTE — Telephone Encounter (Signed)
Received fax notification from [employer] that PA approved effective 12/13/17-12/13/18. Ref#: [id number]. Faxed approval notification to [facility] at [phone]. Received fax confirmation.

## 2018-02-05 ENCOUNTER — Other Ambulatory Visit: Payer: Self-pay | Admitting: Neurology

## 2018-02-06 NOTE — Telephone Encounter (Signed)
Patient called regarding refill for SUMAtriptan (IMITREX) 100 MG tablet. I advised we did receive refill request and she says her daughter is in labor and patient will be going in delivery room with her and has a bad headache. She wants to know if refill can be sent soon. [facility] [address]

## 2018-02-06 NOTE — Telephone Encounter (Signed)
Refill sent as requested. 

## 2018-02-14 IMAGING — MR MR HEAD W/O CM
9 of 11 series · 33 of 48 positions shown · non-contrast
Comparison: 04/18/2016 and 04/05/2011 CT head.

CLINICAL DATA: 55 y/o F; history of chronic migraines presenting
with confusion and word finding difficulties.

EXAM:
MRI HEAD WITHOUT CONTRAST
TECHNIQUE: Multiplanar, multiecho pulse sequences of the brain and surrounding
structures were obtained without intravenous contrast.

[Series 3: DWI · axial · 3.0mm · 0.94mm/px · z∈[-80,+67]mm · 7 of 100 slices shown (1 of 2)]
[im 1/100]
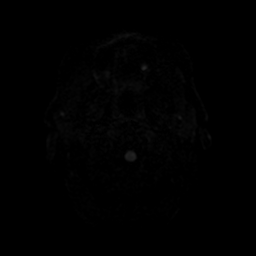
[im 17/100]
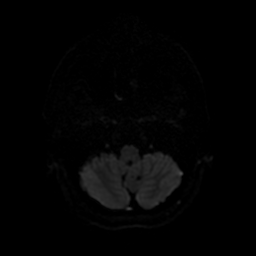
[im 34/100]
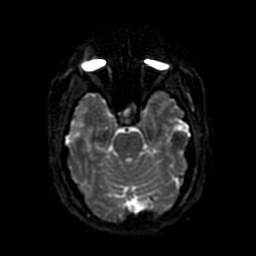
[im 50/100]
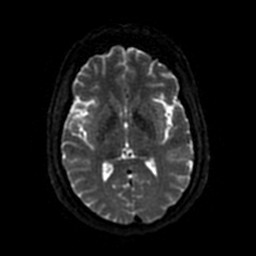
[im 67/100]
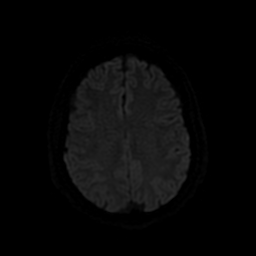
[im 83/100]
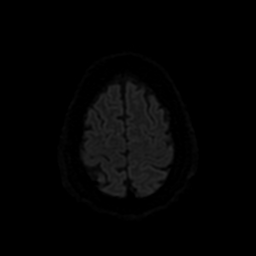
[im 100/100]
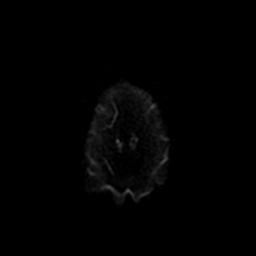

[Series 4: DWI · coronal · 5.0mm · 0.94mm/px · 6 of 74 slices shown (2 of 2)]
[im 1/74]
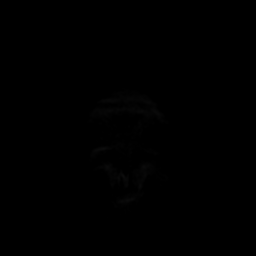
[im 15/74]
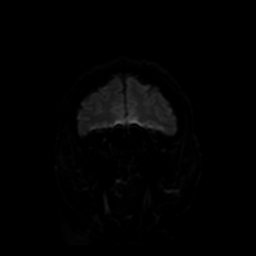
[im 30/74]
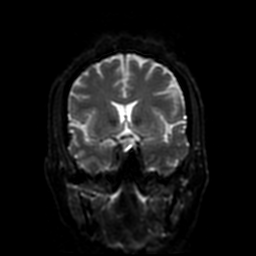
[im 44/74]
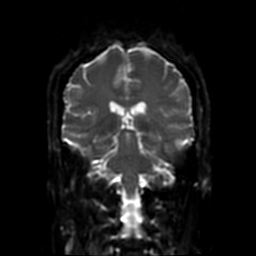
[im 59/74]
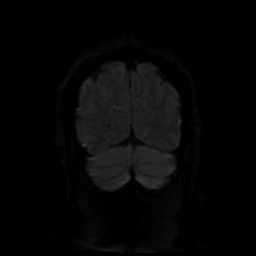
[im 74/74]
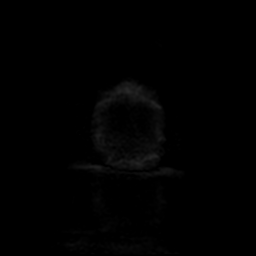

[Series 5: T2 · axial · 5.0mm · 0.47mm/px · z∈[-86,+70]mm · 2 of 27 slices shown]
[im 1/27]
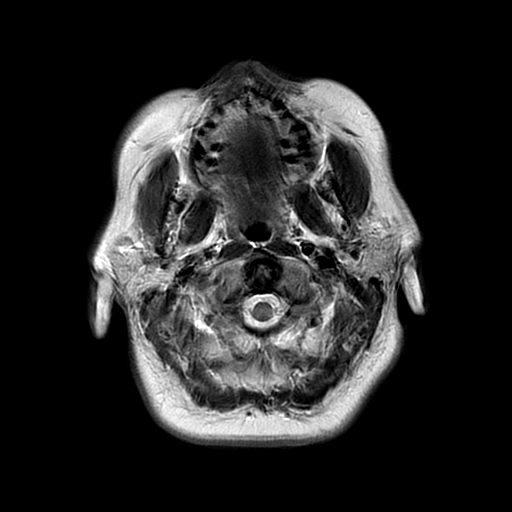
[im 27/27]
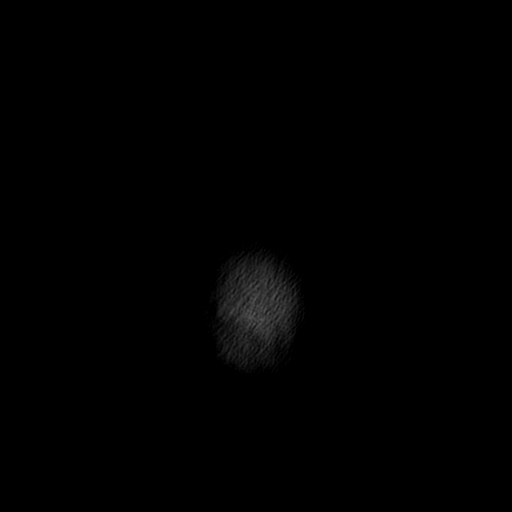

[Series 6: (person_name) · axial · 3.0mm · 0.47mm/px · z∈[-87,+4]mm · 5 of 108 slices shown]
[im 1/108]
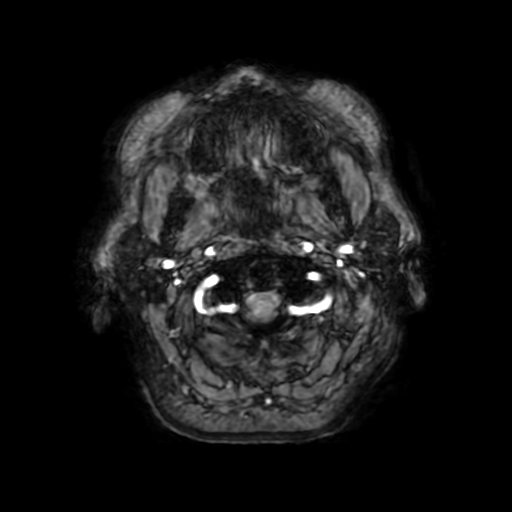
[im 16/108]
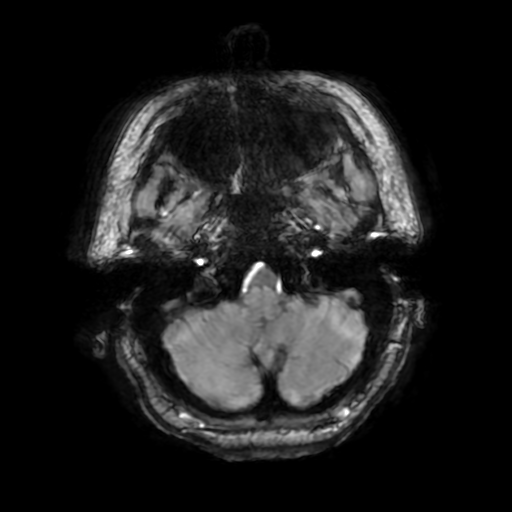
[im 31/108]
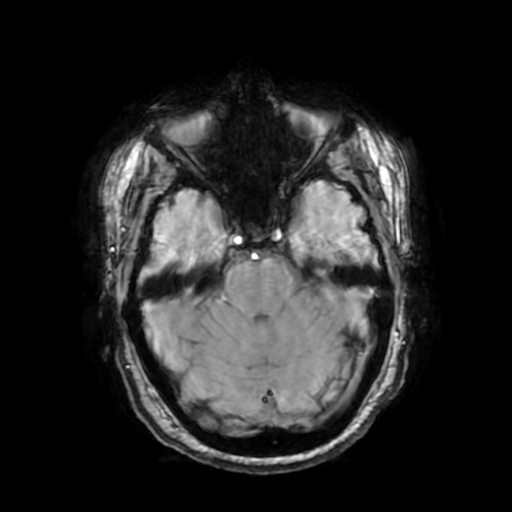
[im 46/108]
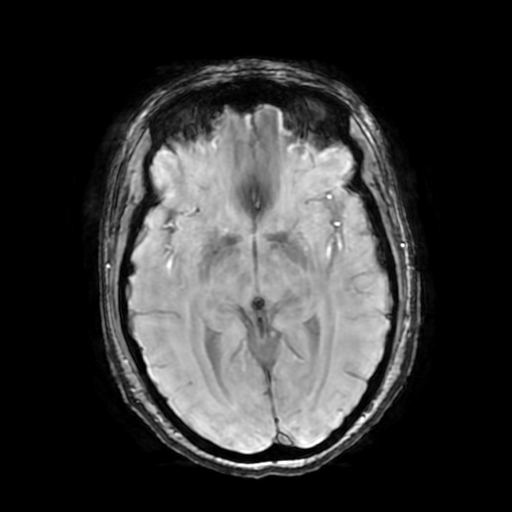
[im 62/108]
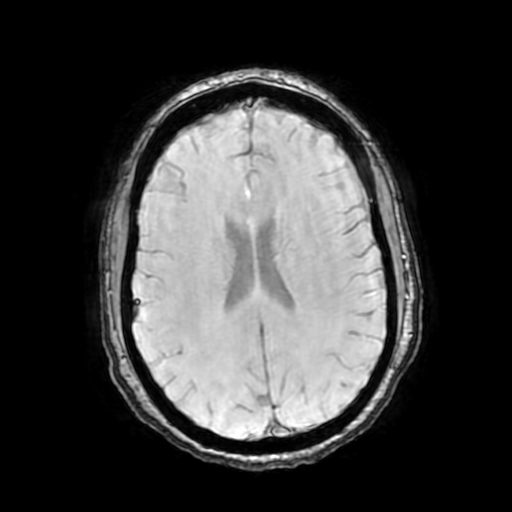

[Series 7: FLAIR · sagittal · 5.0mm · 0.47mm/px · 2 of 23 slices shown (1 of 2)]
[im 1/23]
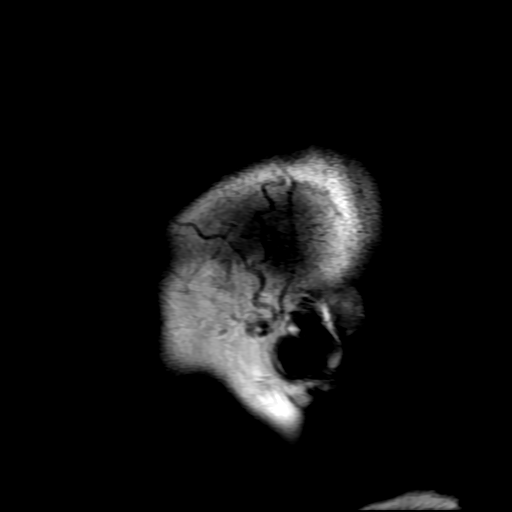
[im 23/23]
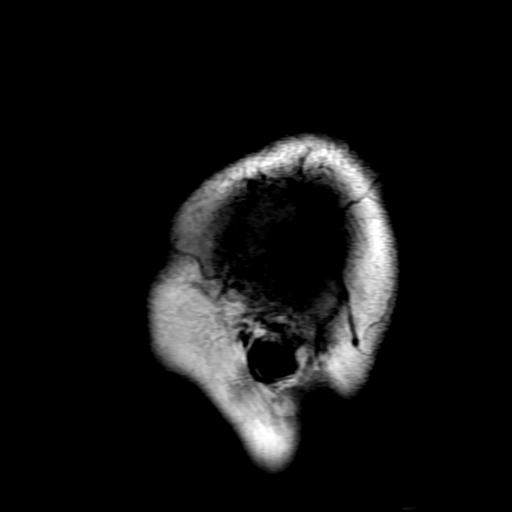

[Series 8: FLAIR · axial · 5.0mm · 0.47mm/px · z∈[-86,+70]mm · 2 of 27 slices shown (2 of 2)]
[im 1/27]
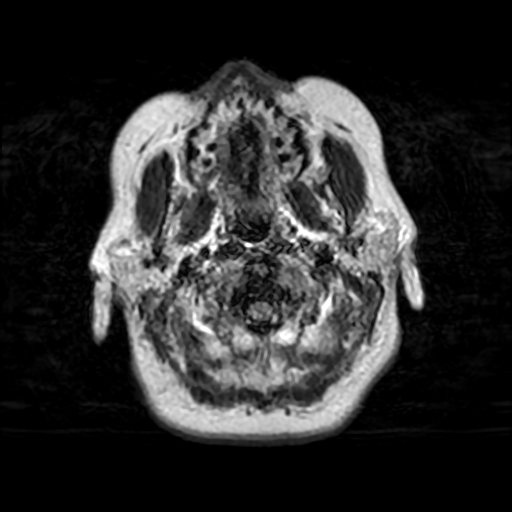
[im 27/27]
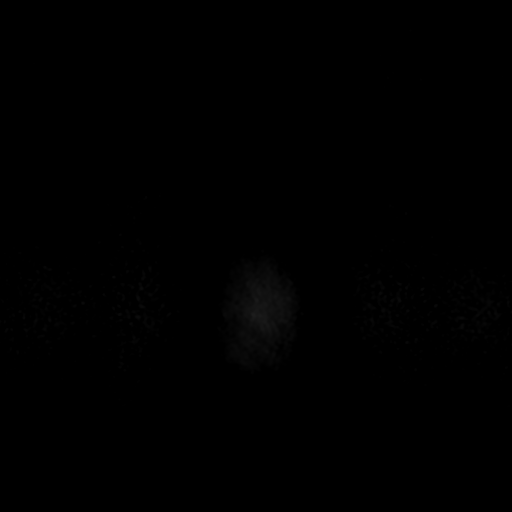

[Series 10: T2 post-contrast · coronal · 5.0mm · 0.39mm/px · 2 of 31 slices shown]
[im 1/31]
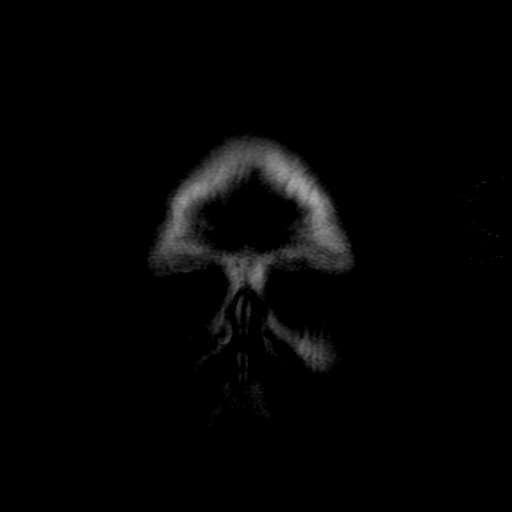
[im 31/31]
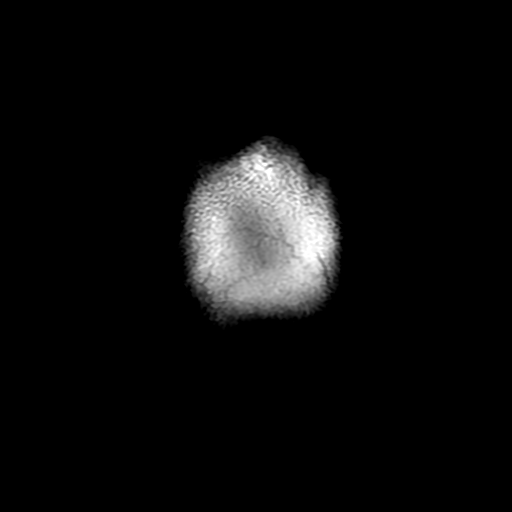

[Series 350: ADC · axial · 3.0mm · 0.94mm/px · z∈[-80,+67]mm · 4 of 50 slices shown (1 of 2)]
[im 1/50]
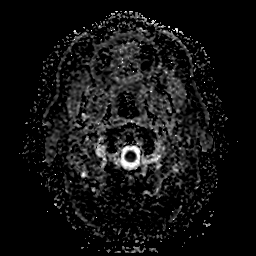
[im 17/50]
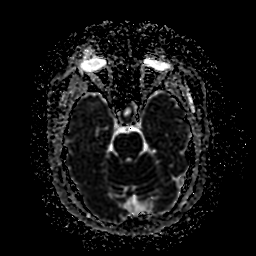
[im 33/50]
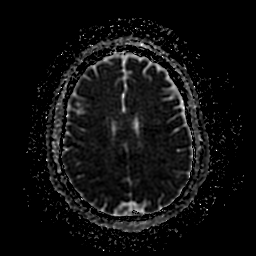
[im 50/50]
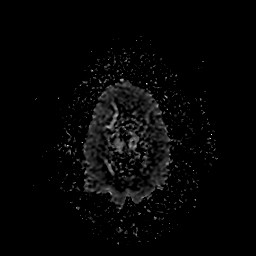

[Series 450: ADC · coronal · 5.0mm · 0.94mm/px · 3 of 37 slices shown (2 of 2)]
[im 1/37]
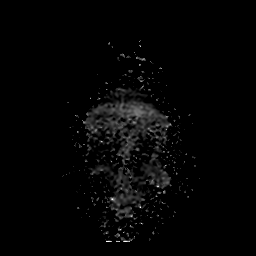
[im 19/37]
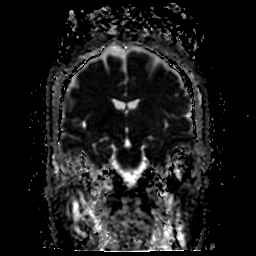
[im 37/37]
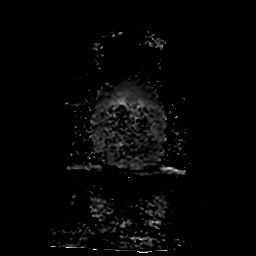

[33 of 48 positions shown; findings below may reference images not displayed]

FINDINGS: Brain: No acute infarction, hemorrhage, hydrocephalus, extra-axial
collection or mass lesion. T1/T2 intermediate to hyperintense focus
within the occipital bone of the posterior fossa just inferior
torcula measuring 6 mm (series 9, image 10) with diffusion
restriction

Vascular: Normal flow voids.

Skull and upper cervical spine: T1/T2 intermediate to hyperintense
focus within the occipital bone of the posterior fossa just inferior
torcula measuring 6 mm (series 9, image 10) with diffusion
restriction and smooth bony lucency on CT, stable from 9439
consistent with benign etiology, possibly a hemangioma or
meningioma.

Sinuses/Orbits: Negative.

Other: None.
IMPRESSION: No acute intracranial abnormality.  Unremarkable MRI of the brain.

By: Koky Humphrey M.D.

## 2018-02-15 ENCOUNTER — Ambulatory Visit: Payer: BLUE CROSS/BLUE SHIELD | Admitting: Neurology

## 2018-02-15 ENCOUNTER — Other Ambulatory Visit: Payer: Self-pay | Admitting: Neurology

## 2018-02-15 ENCOUNTER — Telehealth: Payer: Self-pay | Admitting: Neurology

## 2018-02-15 NOTE — Telephone Encounter (Signed)
This patient canceled the same day of a revisit appointment. 

## 2018-02-16 ENCOUNTER — Encounter: Payer: Self-pay | Admitting: Neurology

## 2018-03-20 ENCOUNTER — Other Ambulatory Visit: Payer: Self-pay | Admitting: Neurology

## 2018-03-22 IMAGING — CR DG CHEST 2V
2 series · 2 of 2 positions shown · non-contrast
Comparison: 04/18/2016

CLINICAL DATA: Chest pain

EXAM:
CHEST  2 VIEW

[w chest pa]
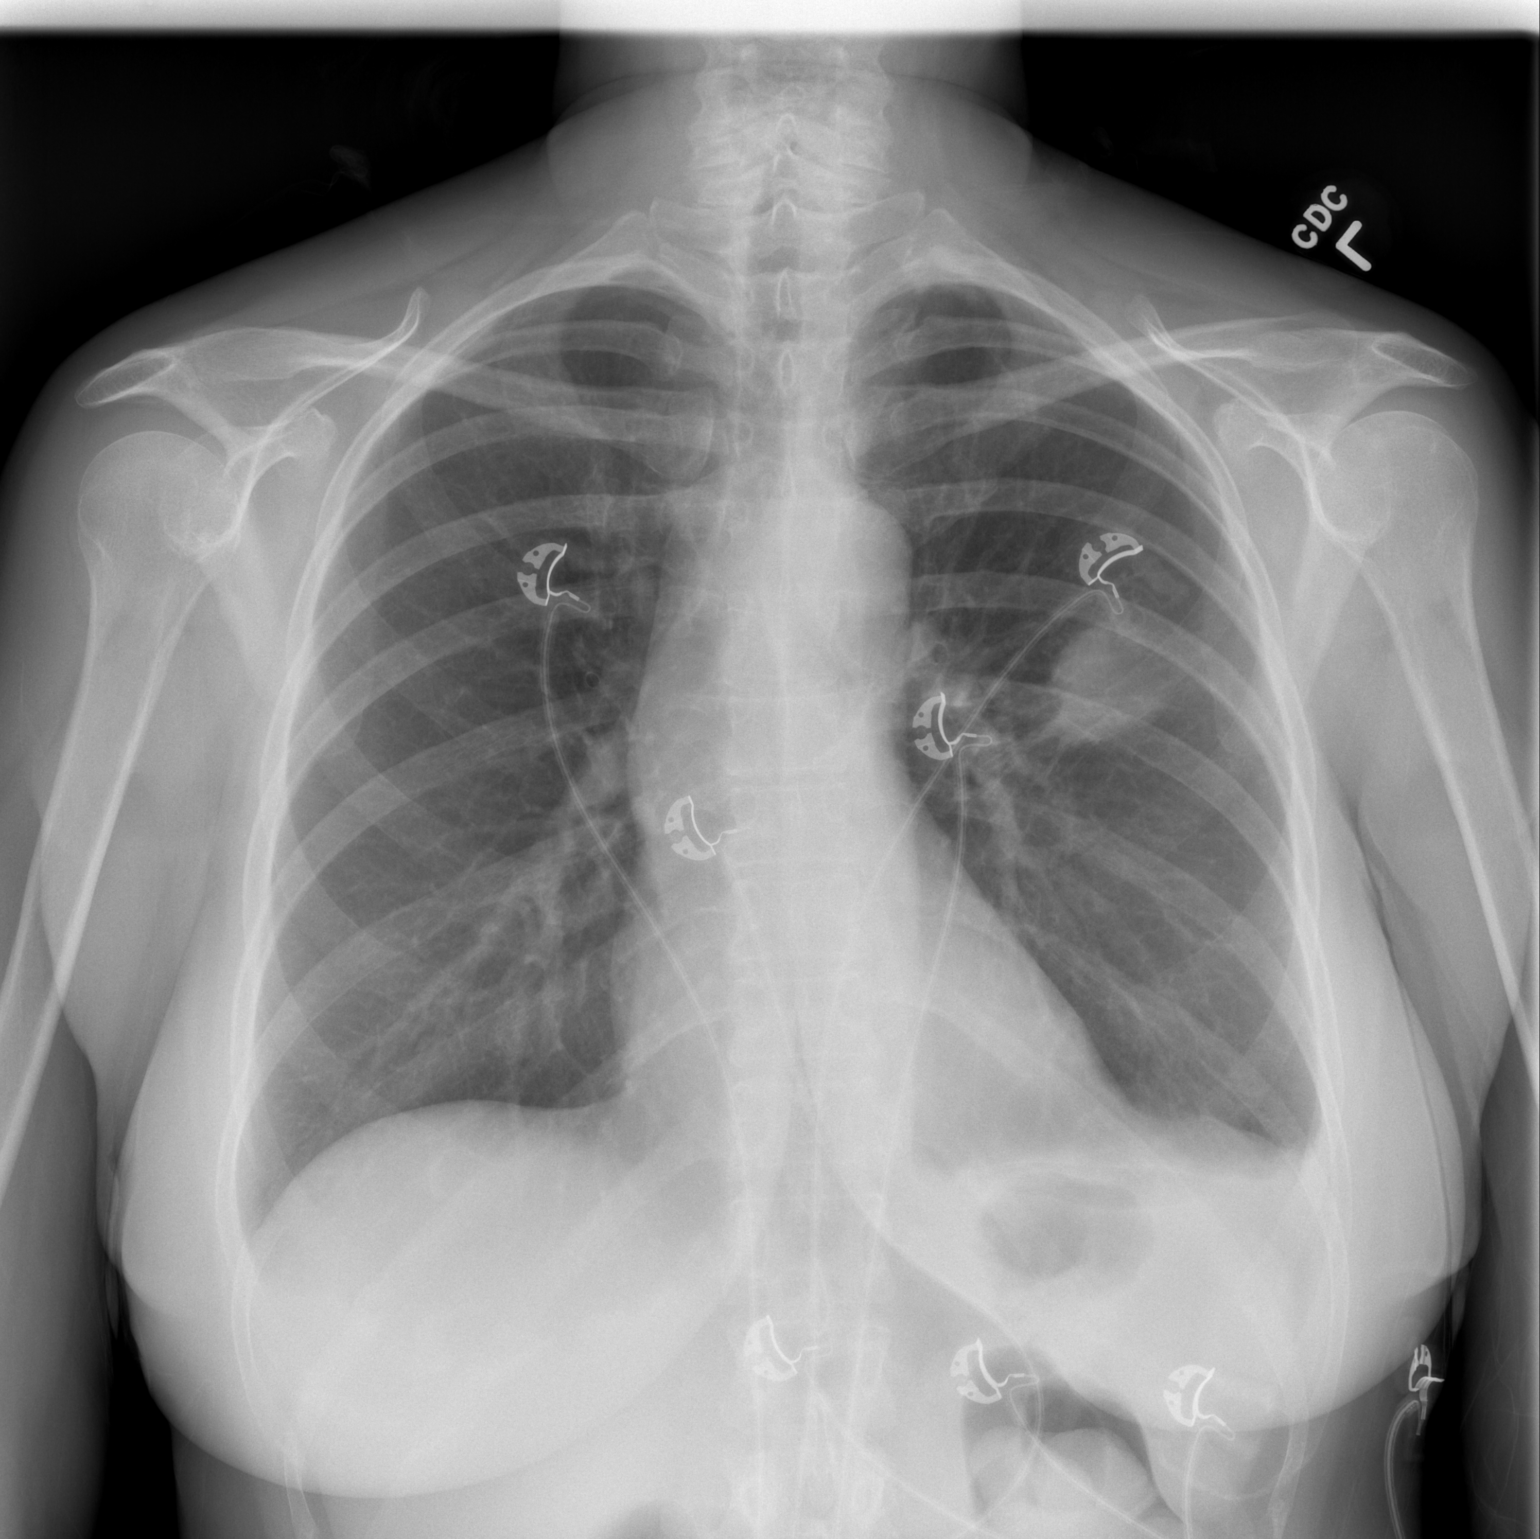

[w chest lat]
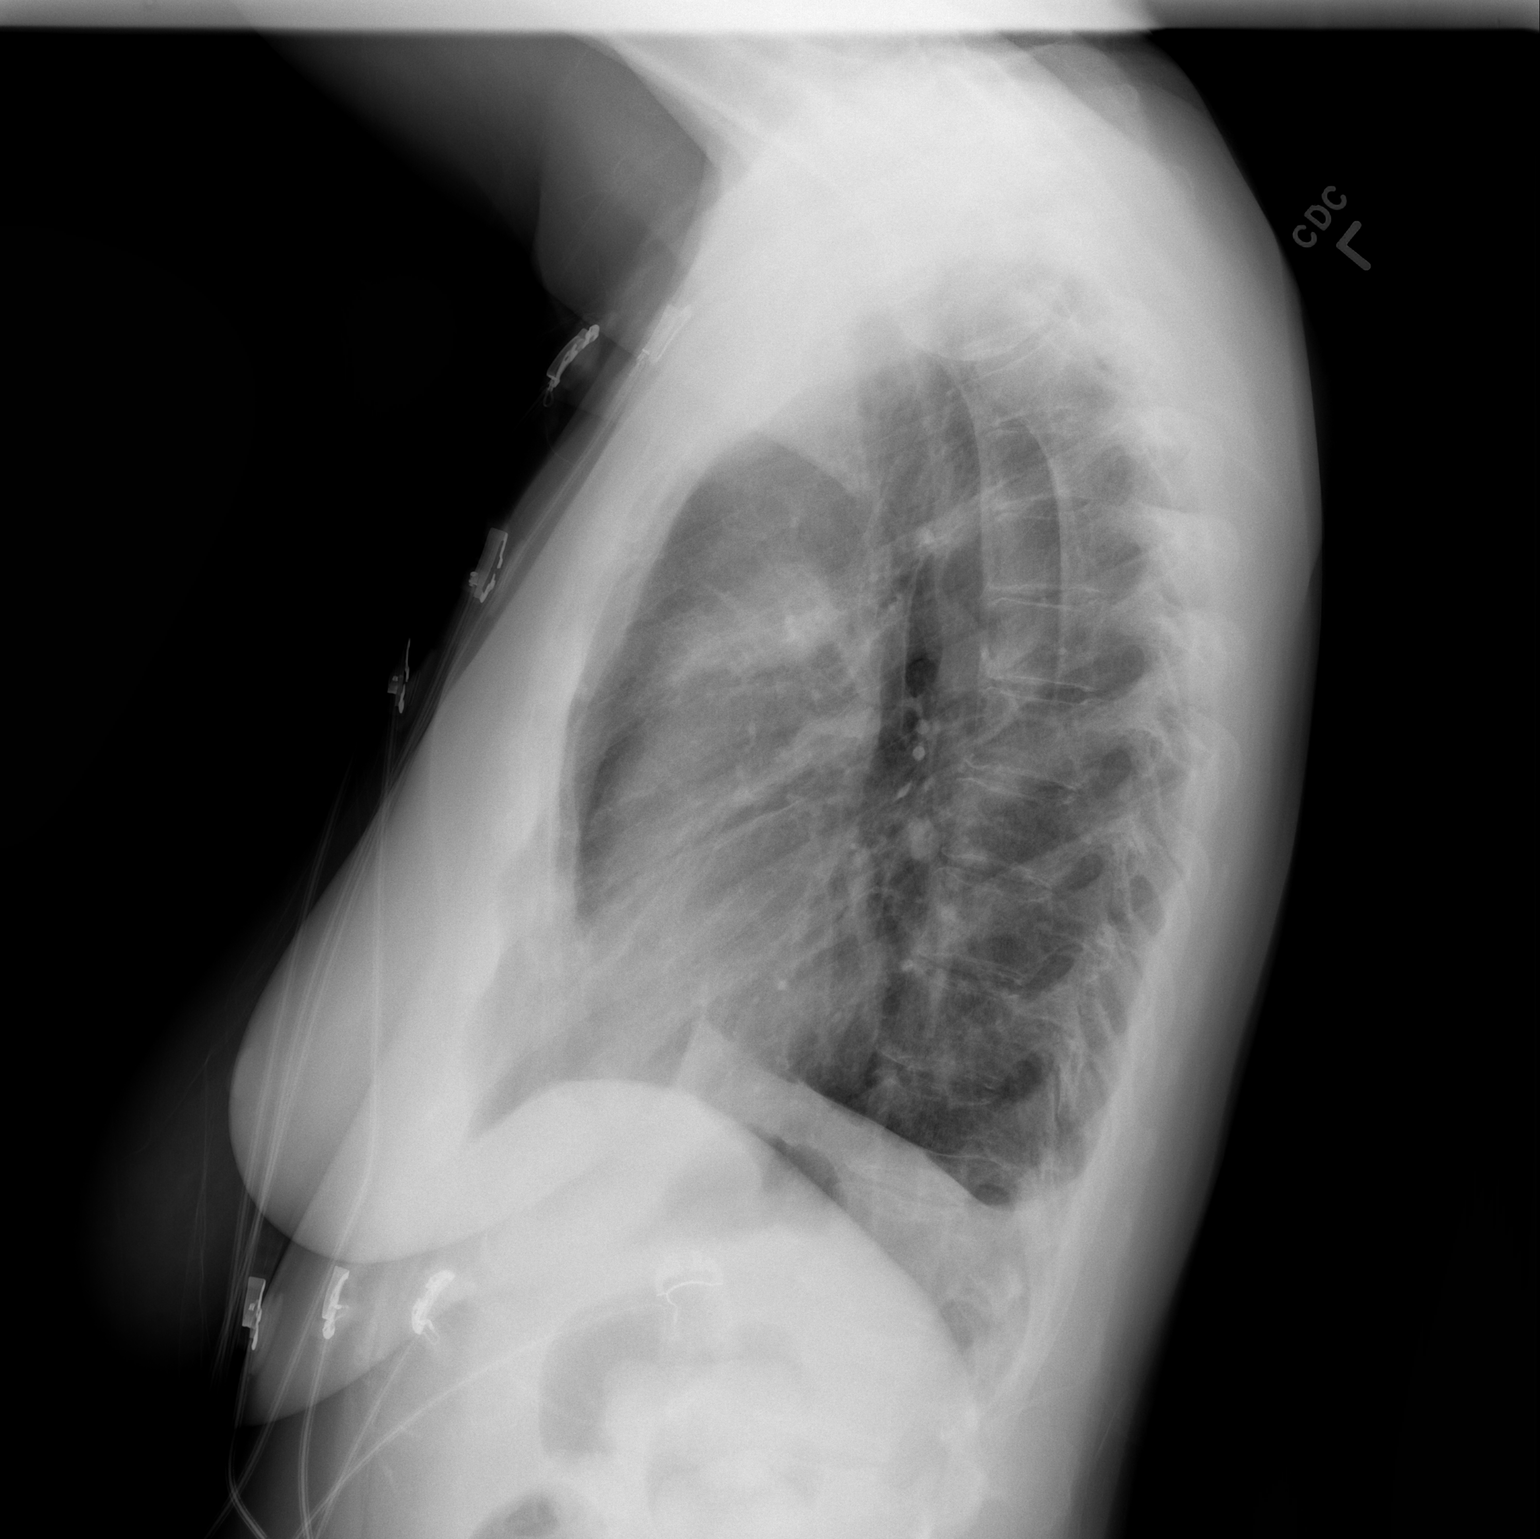

[2 of 2 positions shown; findings below may reference images not displayed]

FINDINGS: Interval development of left upper lobe density anteriorly measuring
approximately 3 cm. This has irregular margins but was not present
previously therefore not likely to be tumor but may represent
pneumonia. Interval development of small left effusion

Right lung remains clear. Heart size normal. Negative for heart
failure.
IMPRESSION: 3 cm left upper lobe nodular density which is new from 1 month ago.
Probable pneumonia. Small left effusion. Followup PA and lateral
chest X-ray is recommended in 3-4 weeks following trial of
antibiotic therapy to ensure resolution and exclude underlying
malignancy.

## 2018-03-23 DIAGNOSIS — F331 Major depressive disorder, recurrent, moderate: Secondary | ICD-10-CM | POA: Diagnosis not present

## 2018-03-23 DIAGNOSIS — Z0289 Encounter for other administrative examinations: Secondary | ICD-10-CM

## 2018-03-28 ENCOUNTER — Other Ambulatory Visit: Payer: Self-pay | Admitting: Neurology

## 2018-04-12 ENCOUNTER — Other Ambulatory Visit: Payer: Self-pay | Admitting: Neurology

## 2018-04-19 ENCOUNTER — Other Ambulatory Visit: Payer: Self-pay | Admitting: Neurology

## 2018-04-22 ENCOUNTER — Other Ambulatory Visit: Payer: Self-pay | Admitting: Neurology

## 2018-04-25 ENCOUNTER — Other Ambulatory Visit: Payer: Self-pay | Admitting: Neurology

## 2018-04-26 ENCOUNTER — Other Ambulatory Visit: Payer: Self-pay | Admitting: Neurology

## 2018-04-26 MED ORDER — SUMATRIPTAN SUCCINATE 100 MG PO TABS: ORAL_TABLET | ORAL | 0 refills | Status: DC

## 2018-04-26 NOTE — Telephone Encounter (Signed)
Pt called stating she is currently of of SUMAtriptan (IMITREX) 100 MG tablet requesting refills sent to [facility]

## 2018-05-16 ENCOUNTER — Encounter: Payer: Self-pay | Admitting: Adult Health

## 2018-05-16 ENCOUNTER — Ambulatory Visit: Payer: BLUE CROSS/BLUE SHIELD | Admitting: Adult Health

## 2018-05-16 VITALS — BP 93/64 | HR 83 | Ht 68.0 in | Wt 159.0 lb

## 2018-05-16 DIAGNOSIS — G43019 Migraine without aura, intractable, without status migrainosus: Secondary | ICD-10-CM

## 2018-05-16 MED ORDER — SUMATRIPTAN SUCCINATE 100 MG PO TABS: ORAL_TABLET | ORAL | 11 refills | Status: DC

## 2018-05-16 MED ORDER — TOPIRAMATE 50 MG PO TABS: ORAL_TABLET | ORAL | 3 refills | Status: DC

## 2018-05-16 MED ORDER — ERENUMAB-AOOE 140 MG/ML ~~LOC~~ SOAJ: 140.0000 mg | SUBCUTANEOUS | 11 refills | Status: DC

## 2018-05-16 NOTE — Patient Instructions (Signed)
Your Plan:  Continue Topamax, gabapentin and Aimovig Consider botox Continue imitrex If your symptoms worsen or you develop new symptoms please let us know.   Thank you for coming to see [place] at [facility]. I hope we have been able to provide you high quality care today.  You may receive a patient satisfaction survey over the next few weeks. We would appreciate your feedback and comments so that we may continue to improve ourselves and the health of our patients.

## 2018-05-16 NOTE — Progress Notes (Signed)
PATIENT: Amanda [name] DOB: 1961-07-03  REASON FOR VISIT: follow up HISTORY FROM: patient  HISTORY OF PRESENT ILLNESS: Today 05/16/18:  Amanda [name] is a 57 year old female with a history of intractable migraine headaches.  She returns today for follow-up.  She is currently on Topamax and gabapentin.  She reports thatAimovig has given her some benefit.  She states that she used to never make it a whole month without running out of the Imitrex now she is.  She presently has 8 headaches a month.  However her headaches can last 2 to 3 days.  Therefore she has approximately 24 headache days a month.  Her headaches typically occur in the frontal region.  She does have photophobia and phonophobia as well as nausea vomiting.  No visual changes.  No numbness and tingling in the arms or legs.  She reports that when she gets a headache she typically takes Imitrex and has to repeat the dose.  She states that Imitrex has helped the severity but does not typically be a headache.  He does use over-the-counter medication such as Excedrin and Aleve.  He does report that she does not take these medications daily.  She returns today for evaluation.  HISTORY Amanda [name] is a 57 year old right-handed white female with a history of intractable migraine headaches.  The patient indicates that she essentially has daily headaches. About 3 or 4 times a week the headaches become incapacitating and she is not able to function.  She has a lot of nausea, she takes Zofran for this.  She has gone up on the Topamax to 150 mg a day, she remains on gabapentin for the migraine and for restless leg syndrome.  She is on Prozac as well.  In the past, she has been on Depakote, Prozac, Effexor, she has tried Inderal but could not tolerate this due to hypotension.  She has been on Wellbutrin, Stadol, Frova, Celebrex, she currently is also on magnesium supplementation.  Nothing has helped her headaches previously.  She takes  Reglan on a regular basis.  The headaches are essentially controlling her life at this point.  She returns to the office today for an evaluation.  REVIEW OF SYSTEMS: Out of a complete 14 system review of symptoms, the patient complains only of the following symptoms, and all other reviewed systems are negative.  Constipation, nausea, restless leg, depression  ALLERGIES: Allergies  Allergen Reactions  . Wellbutrin [Bupropion]     GERD  . Aspirin Other (See Comments)    Makes her ears ring    HOME MEDICATIONS: Outpatient Medications Prior to Visit  Medication Sig Dispense Refill  . AIMOVIG 140 MG/ML SOAJ INJECT 140MG  INTO THE SKIN EVERY 30 DAYS 1 pen 1  . aspirin-acetaminophen-caffeine (EXCEDRIN MIGRAINE) 250-250-65 MG tablet Take 2 tablets by mouth every 6 (six) hours as needed for headache or migraine.    . Calcium Carbonate-Vit D-Min (CALCIUM 1200 PO) Take 1,200 mg by mouth 2 (two) times daily.     . clonazePAM (KLONOPIN) 0.25 MG disintegrating tablet Take 0.5 mg by mouth at bedtime.    Marland Kitchen FLUoxetine (PROZAC) 40 MG capsule Take 40 mg by mouth daily.    Marland Kitchen gabapentin (NEURONTIN) 300 MG capsule TAKE THREE CAPSULES BY MOUTH EVERY NIGHT AT BEDTIME 90 capsule 4  . Magnesium 400 MG CAPS Take 400 mg by mouth 2 (two) times daily.     . metoCLOPramide (REGLAN) 10 MG tablet   4  . omeprazole (PRILOSEC) 20 MG capsule  Take 20 mg by mouth daily.    . ondansetron (ZOFRAN) 8 MG tablet TAKE ONE TABLET BY MOUTH DAILY AS NEEDED FOR NAUSEA OR VOMITING 30 tablet 2  . SUMAtriptan (IMITREX) 100 MG tablet May repeat in 2 hours if headache persists or recurs. 9 tablet 0  . topiramate (TOPAMAX) 50 MG tablet TAKE THREE TABLETS BY MOUTH EVERY NIGHT AT BEDTIME 90 tablet 1  . ranitidine (ZANTAC) 75 MG tablet Take 75 mg by mouth 2 (two) times daily.     No facility-administered medications prior to visit.     PAST MEDICAL HISTORY: Past Medical History:  Diagnosis Date  . Depression   . History of total  hysterectomy   . Migraine with intractable migraine   . Restless leg syndrome     PAST SURGICAL HISTORY: Past Surgical History:  Procedure Laterality Date  . TOTAL ABDOMINAL HYSTERECTOMY      FAMILY HISTORY: Family History  Problem Relation Age of Onset  . Dementia Mother   . Lung cancer Father   . Migraines Sister   . Migraines Sister   . Migraines Brother   . Hypothyroidism Sister   . Hypothyroidism Sister   . Hypothyroidism Sister     SOCIAL HISTORY: Social History   Socioeconomic History  . Marital status: Married    Spouse name: [name]  . Number of children: 2  . Years of education: 64  . Highest education level: Not on file  Occupational History  . Occupation: N/A  Social Needs  . Financial resource strain: Not on file  . Food insecurity:    Worry: Not on file    Inability: Not on file  . Transportation needs:    Medical: Not on file    Non-medical: Not on file  Tobacco Use  . Smoking status: Never Smoker  . Smokeless tobacco: Never Used  Substance and Sexual Activity  . Alcohol use: Yes    Comment: rarely  . Drug use: No  . Sexual activity: Not on file  Lifestyle  . Physical activity:    Days per week: Not on file    Minutes per session: Not on file  . Stress: Not on file  Relationships  . Social connections:    Talks on phone: Not on file    Gets together: Not on file    Attends religious service: Not on file    Active member of club or organization: Not on file    Attends meetings of clubs or organizations: Not on file    Relationship status: Not on file  . Intimate partner violence:    Fear of current or ex partner: Not on file    Emotionally abused: Not on file    Physically abused: Not on file    Forced sexual activity: Not on file  Other Topics Concern  . Not on file  Social History Narrative   Patient lives at home with her husbandGreig Castilla) and she is a Engineer, civil (consulting). The patient has 2 daughters.    Patient drinks very little caffeine,  maybe one cup daily.P   Patient has a college education.   Patient is currently not working.   Right-handed      PHYSICAL EXAM  Vitals:   05/16/18 0932  BP: 93/64  Pulse: 83  Weight: 159 lb (72.1 kg)  Height: 5\' 8"  (1.727 m)   Body mass index is 24.18 kg/m.  Generalized: Well developed, in no acute distress   Neurological examination  Mentation: Alert oriented to  time, place, history taking. Follows all commands speech and language fluent Cranial nerve II-XII: Pupils were equal round reactive to light. Extraocular movements were full, visual field were full on confrontational test. Facial sensation and strength were normal. Uvula tongue midline. Head turning and shoulder shrug  were normal and symmetric. Motor: The motor testing reveals 5 over 5 strength of all 4 extremities. Good symmetric motor tone is noted throughout.  Sensory: Sensory testing is intact to soft touch on all 4 extremities. No evidence of extinction is noted.  Coordination: Cerebellar testing reveals good finger-nose-finger and heel-to-shin bilaterally.  Gait and station: Gait is normal.  Reflexes: Deep tendon reflexes are symmetric and normal bilaterally.   DIAGNOSTIC DATA (LABS, IMAGING, TESTING) - I reviewed patient records, labs, notes, testing and imaging myself where available.  Lab Results  Component Value Date   WBC 11.1 (H) 05/25/2016   HGB 13.4 05/25/2016   HCT 39.4 05/25/2016   MCV 94.0 05/25/2016   PLT 357 05/25/2016      Component Value Date/Time   NA 137 05/25/2016 1840   K 3.5 05/25/2016 1840   CL 107 05/25/2016 1840   CO2 19 (L) 05/25/2016 1840   GLUCOSE 97 05/25/2016 1840   BUN 9 05/25/2016 1840   CREATININE 0.93 05/25/2016 1840   CALCIUM 9.0 05/25/2016 1840   PROT 7.5 05/25/2016 1840   ALBUMIN 3.8 05/25/2016 1840   AST 13 (L) 05/25/2016 1840   ALT 8 (L) 05/25/2016 1840   ALKPHOS 84 05/25/2016 1840   BILITOT 0.7 05/25/2016 1840   GFRNONAA >60 05/25/2016 1840   GFRAA >60  05/25/2016 1840   No results found for: CHOL, HDL, LDLCALC, LDLDIRECT, TRIG, CHOLHDL Lab Results  Component Value Date   HGBA1C 5.1 04/19/2016   No results found for: VITAMINB12 Lab Results  Component Value Date   TSH 0.322 (L) 04/19/2016      ASSESSMENT AND PLAN 57 y.o. year old female  has a past medical history of Depression, History of total hysterectomy, Migraine with intractable migraine, and Restless leg syndrome. here with :  1.  Intractable migraine headaches  The patient will continue on Topamax, gabapentin and Aimovig.  She has tried and failed multiple occasions including Prozac, Depakote, Prozac, Effexor, Inderal, Wellbutrin, Stadol, Frova, Celebrex.  We discussed adding Botox to her current regimen.  The patient is concerned about potential side effects.  I reviewed these with the patient and reassured her.  The patient states that she will give this some thought and let us know.  I also advised that we can do an infusion today for her current headache but she deferred.  She will return in 6 months or sooner if needed.   I spent 15 minutes with the patient. 50% of this time was spent reviewing plan of care and treatment options.   Butch Penny, MSN, NP-C 05/16/2018, 9:45 AM [facility] [address] [place], [place] [address] [phone]

## 2018-05-16 NOTE — Progress Notes (Signed)
I have read the note, and I agree with the clinical assessment and plan.  Zamyah Wiesman K Karlis Cregg   

## 2018-06-19 ENCOUNTER — Other Ambulatory Visit: Payer: Self-pay | Admitting: Physician Assistant

## 2018-06-19 ENCOUNTER — Other Ambulatory Visit: Payer: Self-pay | Admitting: Neurology

## 2018-06-19 NOTE — Telephone Encounter (Signed)
Need to pull paper chart 

## 2018-06-21 ENCOUNTER — Other Ambulatory Visit: Payer: Self-pay | Admitting: Physician Assistant

## 2018-06-26 NOTE — Telephone Encounter (Signed)
Need to review paper chart  

## 2018-06-27 ENCOUNTER — Other Ambulatory Visit: Payer: Self-pay | Admitting: Physician Assistant

## 2018-07-10 DIAGNOSIS — Z0289 Encounter for other administrative examinations: Secondary | ICD-10-CM

## 2018-07-11 ENCOUNTER — Other Ambulatory Visit: Payer: Self-pay | Admitting: Physician Assistant

## 2018-07-11 NOTE — Telephone Encounter (Signed)
Need to review paper chart  

## 2018-08-13 ENCOUNTER — Other Ambulatory Visit: Payer: Self-pay | Admitting: Physician Assistant

## 2018-08-14 NOTE — Telephone Encounter (Signed)
Need to review paper chart not seen in epic  

## 2018-08-24 ENCOUNTER — Telehealth: Payer: Self-pay

## 2018-08-24 NOTE — Telephone Encounter (Signed)
Prior authorization submitted for Fluoxetine 60mg  tabs approved per [employer] Ref # [id number] Effective 08/21/2018-08/21/2021  Faxed to [employer], [place] [phone] [phone]

## 2018-09-01 ENCOUNTER — Other Ambulatory Visit: Payer: Self-pay | Admitting: Physician Assistant

## 2018-09-04 NOTE — Telephone Encounter (Signed)
Need to review paper chart  

## 2018-09-04 NOTE — Telephone Encounter (Signed)
Last fill 01/13 #75

## 2018-09-06 ENCOUNTER — Ambulatory Visit: Payer: BLUE CROSS/BLUE SHIELD | Admitting: Physician Assistant

## 2018-09-06 ENCOUNTER — Encounter: Payer: Self-pay | Admitting: Physician Assistant

## 2018-09-06 DIAGNOSIS — F331 Major depressive disorder, recurrent, moderate: Secondary | ICD-10-CM | POA: Diagnosis not present

## 2018-09-06 DIAGNOSIS — G43511 Persistent migraine aura without cerebral infarction, intractable, with status migrainosus: Secondary | ICD-10-CM | POA: Diagnosis not present

## 2018-09-06 DIAGNOSIS — R29810 Facial weakness: Secondary | ICD-10-CM

## 2018-09-06 DIAGNOSIS — F411 Generalized anxiety disorder: Secondary | ICD-10-CM | POA: Diagnosis not present

## 2018-09-06 MED ORDER — FLUOXETINE HCL 40 MG PO CAPS: 40.0000 mg | ORAL_CAPSULE | Freq: Every day | ORAL | 5 refills | Status: DC

## 2018-09-06 MED ORDER — CLONAZEPAM 1 MG PO TABS: ORAL_TABLET | ORAL | 5 refills | Status: DC

## 2018-09-06 NOTE — Progress Notes (Signed)
[facility]  Patient ID: Josem KaufmannBeth E Salinger,  MRN: [id number]   PCP: Merri BrunetteSmith, Candace, MD  Date of Evaluation: 09/06/2018 Time spent:25 minutes  Chief Complaint:  Chief Complaint    Follow-up      HISTORY/CURRENT STATUS: HPI here for 4858-month med check.  Had to put 1 of her dogs to sleep 3 weeks ago.  That has been extremely hard for her as this was a very special dog.  They have another rescue dog that to-year-old but still lacks very puppy like.  It is hard for her to get used to this new dog.  Patient denies loss of interest in usual activities and is able to enjoy things.  Denies decreased energy or motivation.  Appetite has not changed.  No extreme sadness, tearfulness, or feelings of hopelessness.  Denies any changes in concentration, making decisions or remembering things.  Denies suicidal or homicidal thoughts.  Ask if we can decrease the Prozac.  At the last visit we went up to 60 mg which was helpful at that time.  She states that she is feeling a lot better though and would like to try going back to 40 mg.  60 mg is extremely expensive to compared to the 40.  Anxiety is well controlled with current treatment.  She sleeps pretty well most of the time.  Her migraines are about the same.  She is able to push through the daily migraines to take care of her granddaughter.  Her rescue medicines do seem to help.  She sees Dr. Anne HahnWillis for this.  She does have a severe migraine today.  She often stays nauseated but ginger helps.  States the Zofran does not help anymore and she does not like to take the Reglan often.  Denies muscle or joint pain, stiffness, or dystonia.  Denies dizziness, syncope, seizures, numbness, tingling, tremor, tics, unsteady gait, slurred speech, confusion, or weakness of extremities.  Individual Medical History/ Review of Systems: Changes? :No    Past medications for mental health diagnoses include: Effexor  Allergies: Wellbutrin [bupropion] and  Aspirin   Current Medications:  Current Outpatient Medications:  .  aspirin-acetaminophen-caffeine (EXCEDRIN MIGRAINE) 250-250-65 MG tablet, Take 2 tablets by mouth every 6 (six) hours as needed for headache or migraine., Disp: , Rfl:  .  Calcium Carbonate-Vit D-Min (CALCIUM 1200 PO), Take 1,200 mg by mouth 2 (two) times daily. , Disp: , Rfl:  .  clonazePAM (KLONOPIN) 1 MG tablet, TAKE ONE TABLET BY MOUTH TWICE A DAY AS NEEDED WITH OCCASIONAL EXTRA TABLET  DURING THE DAY AS NEEDED, Disp: 75 tablet, Rfl: 0 .  Erenumab-aooe (AIMOVIG) 140 MG/ML SOAJ, Inject 140 mg into the skin every 30 (thirty) days., Disp: 1 mL, Rfl: 11 .  FLUoxetine (PROZAC) 40 MG capsule, Take 60 mg by mouth daily. , Disp: , Rfl:  .  gabapentin (NEURONTIN) 300 MG capsule, TAKE THREE CAPSULES BY MOUTH EVERY NIGHT AT BEDTIME, Disp: 90 capsule, Rfl: 4 .  Magnesium 400 MG CAPS, Take 400 mg by mouth 2 (two) times daily. , Disp: , Rfl:  .  metoCLOPramide (REGLAN) 10 MG tablet, , Disp: , Rfl: 4 .  omeprazole (PRILOSEC) 20 MG capsule, Take 20 mg by mouth daily., Disp: , Rfl:  .  ondansetron (ZOFRAN) 8 MG tablet, TAKE ONE TABLET BY MOUTH DAILY AS NEEDED FOR NAUSEA OR VOMITING, Disp: 30 tablet, Rfl: 1 .  SUMAtriptan (IMITREX) 100 MG tablet, May repeat in 2 hours if headache persists or recurs., Disp: 9 tablet,  Rfl: 11 .  topiramate (TOPAMAX) 50 MG tablet, TAKE THREE TABLETS BY MOUTH EVERY NIGHT AT BEDTIME, Disp: 90 tablet, Rfl: 3 Medication Side Effects: none  Family Medical/ Social History: Changes?  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  She is really enjoying that.  They are paying her to do this which is very helpful.  MENTAL HEALTH EXAM:  There were no vitals taken for this visit.There is no height or weight on file to calculate BMI.  General Appearance: Casual and Well Groomed  Eye Contact:  Good  Speech:  Clear and Coherent  Volume:  Normal  Mood:   Euthymic  Affect:  Appropriate  Thought Process:  Goal Directed  Orientation:  Full (Time, Place, and Person)  Thought Content: Logical   Suicidal Thoughts:  No  Homicidal Thoughts:  No  Memory:  WNL  Judgement:  Good  Insight:  Good  Psychomotor Activity:  She has a slight droopiness of the right side of her mouth that comes and goes when she talks.   Normal muscle strength w/ assessment of facial muscles and extremities are 5/5 and equal.  Concentration:  Concentration: Good  Recall:  Good  Fund of Knowledge: Good  Language: Good  Assets:  Desire for Improvement  ADL's:  Intact  Cognition: WNL  Prognosis:  Good  Neuro: CN II through XII are grossly intact.  Romberg is negative.  Finger-to-nose is within normal limits.  DIAGNOSES:    ICD-10-CM   1. Major depressive disorder, recurrent episode, moderate (HCC) F33.1   2. Generalized anxiety disorder F41.1   3. Migraine aura, persistent, intractable, with status migrainosus G43.511   4. Mouth droop due to facial weakness R29.810     Receiving Psychotherapy: No    RECOMMENDATIONS: I think the occasional drooping of the right side of her mouth is likely due to the migraine she is having today.  She has never been diagnosed with hemiplegic migraines and has not noticed any neurologic deficits during a migraine.  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  If she does get any numbness, tingling, or other neurologic deficit she should go to the emergency room.  She verbalizes understanding and will have her daughter and husband observe her for any abnormal movements, because she has not noticed anything herself. Decrease Prozac to 40 mg p.o. daily. Continue Klonopin 1 mg twice daily as needed and occasional half midday as needed. Continue gabapentin 300 mg as directed by neurology. Continue Topamax 50 mg 3 nightly.  Per neurology Return in 6 months or sooner as needed.  Melony Overly, PA-C

## 2018-09-07 ENCOUNTER — Ambulatory Visit: Payer: Self-pay | Admitting: Physician Assistant

## 2018-09-14 ENCOUNTER — Other Ambulatory Visit: Payer: Self-pay | Admitting: Physician Assistant

## 2018-09-14 ENCOUNTER — Other Ambulatory Visit: Payer: Self-pay | Admitting: Adult Health

## 2018-09-14 ENCOUNTER — Other Ambulatory Visit: Payer: Self-pay | Admitting: Neurology

## 2018-10-02 ENCOUNTER — Other Ambulatory Visit: Payer: Self-pay | Admitting: Physician Assistant

## 2018-10-04 ENCOUNTER — Other Ambulatory Visit: Payer: Self-pay | Admitting: Physician Assistant

## 2018-10-04 ENCOUNTER — Telehealth: Payer: Self-pay | Admitting: Physician Assistant

## 2018-10-04 NOTE — Telephone Encounter (Signed)
Amanda [name] from [facility] stated they didn't receive script for Klonopin in February [phone]

## 2018-10-04 NOTE — Telephone Encounter (Signed)
Yes I completed it already, didn't realize they sent you a message.

## 2018-10-04 NOTE — Telephone Encounter (Signed)
It looks like one of y'all called this in, right?  If I don't need to do anything else, just mark as complete.  Thanks!

## 2018-10-11 ENCOUNTER — Other Ambulatory Visit: Payer: Self-pay | Admitting: Adult Health

## 2018-10-11 ENCOUNTER — Other Ambulatory Visit: Payer: Self-pay | Admitting: Neurology

## 2018-11-15 DIAGNOSIS — Z0289 Encounter for other administrative examinations: Secondary | ICD-10-CM

## 2018-12-11 ENCOUNTER — Telehealth: Payer: Self-pay | Admitting: *Deleted

## 2018-12-11 NOTE — Telephone Encounter (Signed)
Received request for PA on aimovig 140mg /ml every 30 days.  [employer]BS approved 12-07-18 thru 12-06-19. CMM. Key [id number].

## 2018-12-21 ENCOUNTER — Telehealth: Payer: Self-pay | Admitting: *Deleted

## 2018-12-21 NOTE — Telephone Encounter (Signed)
Due to current COVID 19 pandemic, our office is severely reducing in office visits until further notice, in order to minimize the risk to our patients and healthcare providers. Unable to get in contact with the patient to convert their office visit with Megan on 12/26/2018 into a doxy.me visit. I left a voicemail asking the patient to return my call. Office number was provided.     If patient calls back please convert their office visit into a doxy.me visit.      

## 2018-12-26 ENCOUNTER — Other Ambulatory Visit: Payer: Self-pay

## 2018-12-26 ENCOUNTER — Encounter: Payer: Self-pay | Admitting: Adult Health

## 2018-12-26 ENCOUNTER — Ambulatory Visit (INDEPENDENT_AMBULATORY_CARE_PROVIDER_SITE_OTHER): Payer: BC Managed Care – PPO | Admitting: Adult Health

## 2018-12-26 DIAGNOSIS — G43511 Persistent migraine aura without cerebral infarction, intractable, with status migrainosus: Secondary | ICD-10-CM | POA: Diagnosis not present

## 2018-12-26 MED ORDER — TOPIRAMATE 50 MG PO TABS: 150.0000 mg | ORAL_TABLET | Freq: Every day | ORAL | 11 refills | Status: DC

## 2018-12-26 MED ORDER — GABAPENTIN 300 MG PO CAPS: ORAL_CAPSULE | ORAL | 11 refills | Status: DC

## 2018-12-26 MED ORDER — ONDANSETRON HCL 8 MG PO TABS: 8.0000 mg | ORAL_TABLET | Freq: Three times a day (TID) | ORAL | 1 refills | Status: DC | PRN

## 2018-12-26 NOTE — Progress Notes (Signed)
I have read the note, and I agree with the clinical assessment and plan.  Nichole Neyer K Kosta Schnitzler   

## 2018-12-26 NOTE — Progress Notes (Signed)
[facility] [address] [place].  [address] [phone]     Virtual Visit via Telephone Note  I connected with Amanda [name] on 12/26/18 at  9:30 AM EDT by telephone located remotely at [facility] and verified that I am speaking with the correct person using two identifiers who reports being located at home.    Visit scheduled by RN. I discussed the limitations, risks, security and privacy concerns of performing an evaluation and management service by telephone and the availability of in person appointments. I also discussed with the patient that there may be a patient responsible charge related to this service. The patient expressed understanding and agreed to proceed.    History of Present Illness:  Amanda [name] is a 58 y.o. female who has been followed in this office for migraine headaches.  She was initially scheduled for face-to-face office follow up visit today time but due to COVID19, visit rescheduled for non-face-to-face telephone visit with patients consent. Unable to participate in video visit due to lack of access to device with camera.    12/26/18 Amanda [name] is a 57 year old female with a history of intractable migraine headaches.  She reports that her headaches are better.  She had 1-2 headaches a week.  Her headaches are normally triggered by the weather.  She states that she can take 2 Imitrexs and her headache will resolve within 24 hours.  She states that if she does not have Imitrex or if she only takes 1 Imitrex the headache will last 2 days.  Her headaches tend to occur all over the head.  She does have photophobia and nausea.  She uses Zofran with good benefit for the nausea.  She was on Aimovig but the cost increased.  However the pharmacist informed her that she could use a coupon so she plans to restart Aimovig.  Denies any new issues.  HISTORY 05/16/18:  Amanda [name] is a 58 year old female with a  history of intractable migraine headaches.  She returns today for follow-up.  She is currently on Topamax and gabapentin.  She reports thatAimovig has given her some benefit.  She states that she used to never make it a whole month without running out of the Imitrex now she is.  She presently has 8 headaches a month.  However her headaches can last 2 to 3 days.  Therefore she has approximately 24 headache days a month.  Her headaches typically occur in the frontal region.  She does have photophobia and phonophobia as well as nausea vomiting.  No visual changes.  No numbness and tingling in the arms or legs.  She reports that when she gets a headache she typically takes Imitrex and has to repeat the dose.  She states that Imitrex has helped the severity but does not typically be a headache.  He does use over-the-counter medication such as Excedrin and Aleve.  He does report that she does not take these medications daily.  She returns today for evaluation.    Observations/Objective:  Generalized: Well developed, in no acute distress   Neurological examination  Mentation: Alert oriented to time, place, history taking. Follows all commands speech and language fluent  Assessment and Plan:  1.  Migraine headaches  The patient continues to have several headaches a month.  We have discussed Botox in the past however she still defers for now.  She will continue on Aimovig, gabapentin, Topamax and Imitrex.  She is advised that if her symptoms worsen  or she develops new symptoms she should let us know.  She will follow-up in 6 months or sooner if needed.  Follow Up Instructions:    FU 6 months   I discussed the assessment and treatment plan with the patient.  The patient was provided an opportunity to ask questions and all were answered to their satisfaction. The patient agreed with the plan and verbalized an understanding of the instructions.   I provided 15 minutes of non-face-to-face time during this  encounter.   Butch PennyMegan Zoila Ditullio, NP-C   [facility] [address] [place], [place] [id number]  Phone [phone] Fax [phone] .

## 2019-02-14 ENCOUNTER — Other Ambulatory Visit: Payer: Self-pay | Admitting: Physician Assistant

## 2019-02-16 NOTE — Telephone Encounter (Signed)
Due next week

## 2019-02-19 NOTE — Telephone Encounter (Signed)
Has appt 08/12 

## 2019-02-25 ENCOUNTER — Other Ambulatory Visit: Payer: Self-pay | Admitting: Adult Health

## 2019-03-07 ENCOUNTER — Ambulatory Visit (INDEPENDENT_AMBULATORY_CARE_PROVIDER_SITE_OTHER): Payer: Medicare Other | Admitting: Physician Assistant

## 2019-03-07 ENCOUNTER — Encounter: Payer: Self-pay | Admitting: Physician Assistant

## 2019-03-07 ENCOUNTER — Other Ambulatory Visit: Payer: Self-pay

## 2019-03-07 DIAGNOSIS — G43511 Persistent migraine aura without cerebral infarction, intractable, with status migrainosus: Secondary | ICD-10-CM | POA: Diagnosis not present

## 2019-03-07 DIAGNOSIS — F331 Major depressive disorder, recurrent, moderate: Secondary | ICD-10-CM | POA: Diagnosis not present

## 2019-03-07 DIAGNOSIS — F411 Generalized anxiety disorder: Secondary | ICD-10-CM | POA: Diagnosis not present

## 2019-03-07 NOTE — Progress Notes (Signed)
[facility]  Patient ID: Amanda [name],  MRN: [id number]  PCP: Carol Ada, MD  Date of Evaluation: 03/07/2019 Time spent:15 minutes  Chief Complaint:  Chief Complaint    Depression; Anxiety; Follow-up     Virtual Visit via Telephone Note  I connected with patient by a video enabled telemedicine application or telephone, with their informed consent, and verified patient privacy and that I am speaking with the correct person using two identifiers.  I am private, in my home and the patient is home.   I discussed the limitations, risks, security and privacy concerns of performing an evaluation and management service by telephone and the availability of in person appointments. I also discussed with the patient that there may be a patient responsible charge related to this service. The patient expressed understanding and agreed to proceed.   I discussed the assessment and treatment plan with the patient. The patient was provided an opportunity to ask questions and all were answered. The patient agreed with the plan and demonstrated an understanding of the instructions.   The patient was advised to call back or seek an in-person evaluation if the symptoms worsen or if the condition fails to improve as anticipated.  I provided 15 minutes of non-face-to-face time during this encounter.  HISTORY/CURRENT STATUS: HPI For 6 month med check.   Feels like meds are working well.  "I don't want to change anything.  My mood is stable.  I had another dog that died in 11-05-22, which has been hard.  Our rescue dog, [name], is growing on me."  Patient denies loss of interest in usual activities and is able to enjoy things.  Denies decreased energy or motivation.  Appetite has not changed.  No extreme sadness, tearfulness, or feelings of hopelessness.  Denies any changes in concentration, making decisions or remembering things.  Denies suicidal or homicidal thoughts.  Anxiety is  well-controlled w/ Klonopin.   Her Mom died in 12-06-2022 at 44 yo. That was hard b/c they couldn't have funeral.  "I'm ok but it's just sad.  She had dementia for many years."  Denies dizziness, syncope, seizures, numbness, tingling, tremor, tics, unsteady gait, slurred speech, confusion. Denies muscle or joint pain, stiffness, or dystonia.  Individual Medical History/ Review of Systems: Changes? :Yes  Now on disability for chronic migraines  Past medications for mental health diagnoses include: Effexor  Allergies: Wellbutrin [bupropion] and Aspirin  Current Medications:  Current Outpatient Medications:  .  aspirin-acetaminophen-caffeine (EXCEDRIN MIGRAINE) 250-250-65 MG tablet, Take 2 tablets by mouth every 6 (six) hours as needed for headache or migraine., Disp: , Rfl:  .  Calcium Carbonate-Vit D-Min (CALCIUM 1200 PO), Take 1,200 mg by mouth 2 (two) times daily. , Disp: , Rfl:  .  clonazePAM (KLONOPIN) 1 MG tablet, TAKE ONE TABLET BY MOUTH TWICE A DAY MAY TAKE ADDITIONAL TABLET BY MOUTH IF NEEDED, Disp: 75 tablet, Rfl: 3 .  FLUoxetine (PROZAC) 40 MG capsule, Take 1 capsule (40 mg total) by mouth daily., Disp: 30 capsule, Rfl: 5 .  gabapentin (NEURONTIN) 300 MG capsule, TAKE THREE CAPSULES BY MOUTH EVERY NIGHT AT BEDTIME, Disp: 90 capsule, Rfl: 11 .  Magnesium 400 MG CAPS, Take 400 mg by mouth 2 (two) times daily. , Disp: , Rfl:  .  omeprazole (PRILOSEC) 20 MG capsule, Take 20 mg by mouth daily., Disp: , Rfl:  .  ondansetron (ZOFRAN) 8 MG tablet, TAKE ONE TABLET BY MOUTH EVERY 8 HOURS AS NEEDED FOR NAUSEA/ VOMITING, Disp:  30 tablet, Rfl: 0 .  SUMAtriptan (IMITREX) 100 MG tablet, May repeat in 2 hours if headache persists or recurs., Disp: 9 tablet, Rfl: 11 .  topiramate (TOPAMAX) 50 MG tablet, Take 3 tablets (150 mg total) by mouth at bedtime., Disp: 90 tablet, Rfl: 11 .  Erenumab-aooe (AIMOVIG) 140 MG/ML SOAJ, Inject 140 mg into the skin every 30 (thirty) days. (Patient not taking: Reported on  03/07/2019), Disp: 1 mL, Rfl: 11 .  metoCLOPramide (REGLAN) 10 MG tablet, , Disp: , Rfl: 4 Medication Side Effects: none  Family Medical/ Social History: Changes? Mom died 4/20.  MENTAL HEALTH EXAM:  There were no vitals taken for this visit.There is no height or weight on file to calculate BMI.  General Appearance: unable to assess  Eye Contact:  unable to assess  Speech:  Clear and Coherent  Volume:  Normal  Mood:  Euthymic  Affect:  unable to assess  Thought Process:  Goal Directed  Orientation:  Full (Time, Place, and Person)  Thought Content: Logical   Suicidal Thoughts:  No  Homicidal Thoughts:  No  Memory:  WNL  Judgement:  Good  Insight:  Good  Psychomotor Activity:  unable to assess  Concentration:  Concentration: Good  Recall:  Good  Fund of Knowledge: Good  Language: Good  Assets:  Desire for Improvement  ADL's:  Intact  Cognition: WNL  Prognosis:  Good    DIAGNOSES:    ICD-10-CM   1. Major depressive disorder, recurrent episode, moderate (HCC)  F33.1   2. Generalized anxiety disorder  F41.1   3. Migraine aura, persistent, intractable, with status migrainosus  G43.511     Receiving Psychotherapy: No    RECOMMENDATIONS:  Continue Prozac 40 mg daily. Continue Klonopin 1 mg twice daily as needed. Continue gabapentin 300 mg 3 times daily. Continue Topamax 150 mg nightly. Continue Imitrex 100 mg as needed. Return in 6 months  Melony Overlyeresa Frimet Durfee, PA-C   This record has been created using [employer] software.  Chart creation errors have been sought, but may not always have been located and corrected. Such creation errors do not reflect on the standard of medical care.

## 2019-03-16 ENCOUNTER — Telehealth: Payer: Self-pay | Admitting: Adult Health

## 2019-03-16 MED ORDER — METHYLPREDNISOLONE 4 MG PO TBPK: ORAL_TABLET | ORAL | 0 refills | Status: DC

## 2019-03-16 NOTE — Telephone Encounter (Signed)
Pt states she has had a migraine for a week now.  Pt would like to know if NP Megan could call in a prescription of Prednisone to [facility] for her.  Pt informed office not open today.  Pt asked if On Call dr could call this in for her because of the time she has had this migraine and since Jinny Blossom has done it for her before.  Please call

## 2019-03-16 NOTE — Telephone Encounter (Signed)
Medrol tabs sent to pharmacy, for 9 day taper. Please notify patient.

## 2019-03-16 NOTE — Addendum Note (Signed)
Addended by: Star Age on: 03/16/2019 09:27 AM   Modules accepted: Orders

## 2019-03-21 ENCOUNTER — Other Ambulatory Visit: Payer: Self-pay | Admitting: Physician Assistant

## 2019-03-21 ENCOUNTER — Telehealth: Payer: Self-pay | Admitting: Physician Assistant

## 2019-03-21 MED ORDER — FLUOXETINE HCL 40 MG PO CAPS: 40.0000 mg | ORAL_CAPSULE | Freq: Every day | ORAL | 1 refills | Status: DC

## 2019-03-21 NOTE — Telephone Encounter (Signed)
Patient would like for Prozac to be increased to 80 mg, depression gotten a little worse, please send to [facility] on [address].

## 2019-03-21 NOTE — Telephone Encounter (Signed)
Rx sent 

## 2019-05-03 ENCOUNTER — Other Ambulatory Visit: Payer: Self-pay | Admitting: Adult Health

## 2019-05-07 ENCOUNTER — Other Ambulatory Visit: Payer: Self-pay

## 2019-05-07 ENCOUNTER — Other Ambulatory Visit: Payer: Self-pay | Admitting: Adult Health

## 2019-05-07 NOTE — Telephone Encounter (Signed)
Imitrex was just refilled on 05/03/19.  Can you call and verify with the pharmacy

## 2019-05-07 NOTE — Telephone Encounter (Signed)
Patient is requesting a refill on her SUMAtriptan (IMITREX) 100 MG tablet. Pharmacy has been confirmed on file.

## 2019-05-11 DIAGNOSIS — G2581 Restless legs syndrome: Secondary | ICD-10-CM | POA: Diagnosis not present

## 2019-05-11 DIAGNOSIS — F419 Anxiety disorder, unspecified: Secondary | ICD-10-CM | POA: Diagnosis not present

## 2019-05-11 DIAGNOSIS — G43009 Migraine without aura, not intractable, without status migrainosus: Secondary | ICD-10-CM | POA: Diagnosis not present

## 2019-05-11 DIAGNOSIS — R109 Unspecified abdominal pain: Secondary | ICD-10-CM | POA: Diagnosis not present

## 2019-06-13 ENCOUNTER — Other Ambulatory Visit: Payer: Self-pay | Admitting: Physician Assistant

## 2019-06-14 NOTE — Telephone Encounter (Signed)
Pt called checking status on refill for Clonazepam. Completely out.

## 2019-06-14 NOTE — Telephone Encounter (Signed)
Next apt 12/18

## 2019-07-02 ENCOUNTER — Ambulatory Visit: Payer: Medicare Other | Admitting: Adult Health

## 2019-07-02 ENCOUNTER — Encounter: Payer: Self-pay | Admitting: Adult Health

## 2019-07-02 ENCOUNTER — Other Ambulatory Visit: Payer: Self-pay

## 2019-07-02 VITALS — BP 98/69 | HR 87 | Temp 96.8°F | Ht 68.0 in | Wt 149.2 lb

## 2019-07-02 DIAGNOSIS — G43719 Chronic migraine without aura, intractable, without status migrainosus: Secondary | ICD-10-CM | POA: Diagnosis not present

## 2019-07-02 MED ORDER — ELETRIPTAN HYDROBROMIDE 20 MG PO TABS: ORAL_TABLET | ORAL | 0 refills | Status: DC

## 2019-07-02 NOTE — Progress Notes (Signed)
I have read the note, and I agree with the clinical assessment and plan.  Charles K Willis   

## 2019-07-02 NOTE — Patient Instructions (Signed)
Your Plan:  Continue Gabapentin and Topamax Start Relpax If your symptoms worsen or you develop new symptoms please let us know.    Thank you for coming to see [place] at [facility]. I hope we have been able to provide you high quality care today.  You may receive a patient satisfaction survey over the next few weeks. We would appreciate your feedback and comments so that we may continue to improve ourselves and the health of our patients.  Eletriptan tablets What is this medicine? ELETRIPTAN (el ih TRIP tan) is used to treat migraines with or without aura. An aura is a strange feeling or visual disturbance that warns you of an attack. It is not used to prevent migraines. This medicine may be used for other purposes; ask your health care provider or pharmacist if you have questions. COMMON BRAND NAME(S): Relpax What should I tell my health care provider before I take this medicine? They need to know if you have any of these conditions:  cigarette smoker  circulation problems in fingers and toes  diabetes  heart disease  high blood pressure  high cholesterol  history of irregular heartbeat  history of stroke  kidney disease  liver disease  stomach or intestine problems  an unusual or allergic reaction to eletriptan, other medicines, foods, dyes, or preservatives  pregnant or trying to get pregnant  breast-feeding How should I use this medicine? Take this medicine by mouth with a glass of water. Follow the directions on the prescription label. Do not take it more often than directed. Talk to your pediatrician regarding the use of this medicine in children. Special care may be needed. Overdosage: If you think you have taken too much of this medicine contact a poison control center or emergency room at once. NOTE: This medicine is only for you. Do not share this medicine with others. What if I miss a dose? This does not apply. This medicine is not for  regular use. What may interact with this medicine? Do not take this medicine with any of the following medications:  ceritinib  certain antibiotics like clarithromycin or telithromycin  certain antivirals for HIV or hepatitis  certain medicines for fungal infections like ketoconazole, itraconazole, or posaconazole  certain medicines for migraine headache like almotriptan, eletriptan, frovatriptan, naratriptan, rizatriptan, sumatriptan, zolmitriptan  chloramphenicol  conivaptan  ergot alkaloids like dihydroergotamine, ergonovine, ergotamine, methylergonovine  idelalisib  mifepristone  nefazodone  ribociclib This medicine may also interact with the following medications:  certain medicines for depression, anxiety, or psychotic disorders  MAOIs like Carbex, Eldepryl, Marplan, Nardil, and Parnate This list may not describe all possible interactions. Give your health care provider a list of all the medicines, herbs, non-prescription drugs, or dietary supplements you use. Also tell them if you smoke, drink alcohol, or use illegal drugs. Some items may interact with your medicine. What should I watch for while using this medicine? Visit your healthcare professional for regular checks on your progress. Tell your healthcare professional if your symptoms do not start to get better or if they get worse. You may get drowsy or dizzy. Do not drive, use machinery, or do anything that needs mental alertness until you know how this medicine affects you. Do not stand up or sit up quickly, especially if you are an older patient. This reduces the risk of dizzy or fainting spells. Alcohol may interfere with the effect of this medicine. Your mouth may get dry. Chewing sugarless gum or sucking hard candy and  drinking plenty of water may help. Contact your healthcare professional if the problem does not go away or is severe. If you take migraine medicines for 10 or more days a month, your migraines may  get worse. Keep a diary of headache days and medicine use. Contact your healthcare professional if your migraine attacks occur more frequently. What side effects may I notice from receiving this medicine? Side effects that you should report to your doctor or health care professional as soon as possible:  allergic reactions like skin rash, itching or hives, swelling of the face, lips, or tongue  chest pain or chest tightness  signs and symptoms of a dangerous change in heartbeat or heart rhythm like chest pain; dizziness; fast, irregular heartbeat; palpitations; feeling faint or lightheaded; falls; breathing problems  signs and symptoms of a stroke like changes in vision; confusion; trouble speaking or understanding; severe headaches; sudden numbness or weakness of the face, arm or leg; trouble walking; dizziness; loss of balance or coordination  signs and symptoms of serotonin syndrome like irritable; confusion; diarrhea; fast or irregular heartbeat; muscle twitching; stiff muscles; trouble walking; sweating; high fever; seizures; chills; vomiting Side effects that usually do not require medical attention (report to your doctor or health care professional if they continue or are bothersome):  diarrhea  dizziness  drowsiness  dry mouth  headache  nausea, vomiting  pain, tingling, numbness in the hands or feet  stomach pain This list may not describe all possible side effects. Call your doctor for medical advice about side effects. You may report side effects to FDA at 1-800-FDA-1088. Where should I keep my medicine? Keep out of the reach of children. Store at room temperature between 15 and 30 degrees C (59 and 86 degrees F). Throw away any unused medicine after the expiration date. NOTE: This sheet is a summary. It may not cover all possible information. If you have questions about this medicine, talk to your doctor, pharmacist, or health care provider.  2020 Elsevier/Gold  Standard (2018-01-24 14:44:50)

## 2019-07-02 NOTE — Progress Notes (Signed)
PATIENT: Amanda [name] DOB: 05-13-1961  REASON FOR VISIT: follow up HISTORY FROM: patient  HISTORY OF PRESENT ILLNESS: Today 07/02/19:  [name] is a 58 year old female with a history of intractable migraine headaches.  She returns today for follow-up.  She states that she tried to restart Aimovig but her insurance was switched to Medicare will not cover this.  She continues on gabapentin and Topamax.  She states that Imitrex was working well for her but has now caused her some stomach upset.  She would like to switch to Relpax.  She states that she has a headache 2-3 times a week.  The headaches can last 1 to 2 days.  Headaches can still be related to weather changes and certain foods.  She states that she has been reading about anxiety migraines.  She feels that this describes her.  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  She had a lot of anxiety that stems from going into peoples homes.  Now she does not like to go out or even have people over.  She states that she has set up a follow-up with her psychiatrist and is getting started with a counselor.  She returns today for an evaluation.  HISTORY [name] is a 58 year old female with a history of intractable migraine headaches.  She reports that her headaches are better.  She had 1-2 headaches a week.  Her headaches are normally triggered by the weather.  She states that she can take 2 Imitrexs and her headache will resolve within 24 hours.  She states that if she does not have Imitrex or if she only takes 1 Imitrex the headache will last 2 days.  Her headaches tend to occur all over the head.  She does have photophobia and nausea.  She uses Zofran with good benefit for the nausea.  She was on Aimovig but the cost increased.  However the pharmacist informed her that she could use a coupon so she plans to restart Aimovig.  Denies any new issues.   REVIEW OF SYSTEMS: Out of a complete 14 system review of symptoms,  the patient complains only of the following symptoms, and all other reviewed systems are negative.  See HPI  ALLERGIES: Allergies  Allergen Reactions  . Wellbutrin [Bupropion]     GERD  . Aspirin Other (See Comments)    Makes her ears ring    HOME MEDICATIONS: Outpatient Medications Prior to Visit  Medication Sig Dispense Refill  . aspirin-acetaminophen-caffeine (EXCEDRIN MIGRAINE) 250-250-65 MG tablet Take 2 tablets by mouth every 6 (six) hours as needed for headache or migraine.    . Calcium Carbonate-Vit D-Min (CALCIUM 1200 PO) Take 1,200 mg by mouth 2 (two) times daily.     . clonazePAM (KLONOPIN) 1 MG tablet TAKE ONE TABLET BY MOUTH TWICE A DAY MAY TAKE ADDITIONAL TABLET IF NEEDED 75 tablet 2  . FLUoxetine (PROZAC) 40 MG capsule Take 1 capsule (40 mg total) by mouth daily. 60 capsule 1  . gabapentin (NEURONTIN) 300 MG capsule TAKE THREE CAPSULES BY MOUTH EVERY NIGHT AT BEDTIME 90 capsule 11  . Magnesium 400 MG CAPS Take 400 mg by mouth 2 (two) times daily.     Marland Kitchen. omeprazole (PRILOSEC) 20 MG capsule Take 20 mg by mouth daily.    . ondansetron (ZOFRAN) 8 MG tablet TAKE ONE TABLET BY MOUTH EVERY 8 HOURS AS NEEDED FOR NAUSEA/ VOMITING 30 tablet 0  . topiramate (TOPAMAX) 50 MG tablet Take 3  tablets (150 mg total) by mouth at bedtime. 90 tablet 11  . Erenumab-aooe (AIMOVIG) 140 MG/ML SOAJ Inject 140 mg into the skin every 30 (thirty) days. (Patient not taking: Reported on 03/07/2019) 1 mL 11  . SUMAtriptan (IMITREX) 100 MG tablet TAKE ONE TABLET BY MOUTH AT ONSET OF HEADACHE; MAY REPEAT ONE TABLET IN 2 HOURS IF NEEDED. (Patient not taking: Reported on 07/02/2019) 9 tablet 10  . methylPREDNISolone (MEDROL DOSEPAK) 4 MG TBPK tablet 4 pills for 2 days, then 3 pills for 2 days, 2 pills for 2 days, then 1 pill each day till finished. 21 tablet 0  . metoCLOPramide (REGLAN) 10 MG tablet   4   No facility-administered medications prior to visit.     PAST MEDICAL HISTORY: Past Medical History:   Diagnosis Date  . Depression   . History of total hysterectomy   . Migraine with intractable migraine   . Restless leg syndrome     PAST SURGICAL HISTORY: Past Surgical History:  Procedure Laterality Date  . TOTAL ABDOMINAL HYSTERECTOMY      FAMILY HISTORY: Family History  Problem Relation Age of Onset  . Dementia Mother   . Lung cancer Father   . Migraines Sister   . Migraines Sister   . Migraines Brother   . Hypothyroidism Sister   . Hypothyroidism Sister   . Hypothyroidism Sister     SOCIAL HISTORY: Social History   Socioeconomic History  . Marital status: Married    Spouse name: Amanda [name]  . Number of children: 2  . Years of education: 69  . Highest education level: Not on file  Occupational History  . Occupation: N/A  Social Needs  . Financial resource strain: Not on file  . Food insecurity    Worry: Not on file    Inability: Not on file  . Transportation needs    Medical: Not on file    Non-medical: Not on file  Tobacco Use  . Smoking status: Never Smoker  . Smokeless tobacco: Never Used  Substance and Sexual Activity  . Alcohol use: Yes    Alcohol/week: 1.0 standard drinks    Types: 1 Glasses of wine per week    Comment: one a year  . Drug use: No  . Sexual activity: Not on file  Lifestyle  . Physical activity    Days per week: Not on file    Minutes per session: Not on file  . Stress: Not on file  Relationships  . Social Herbalist on phone: Not on file    Gets together: Not on file    Attends religious service: Not on file    Active member of club or organization: Not on file    Attends meetings of clubs or organizations: Not on file    Relationship status: Not on file  . Intimate partner violence    Fear of current or ex partner: Not on file    Emotionally abused: Not on file    Physically abused: Not on file    Forced sexual activity: Not on file  Other Topics Concern  . Not on file  Social History Narrative   Patient  lives at home with her husbandMitzi [name]) and she is a Marine scientist. The patient has 2 daughters.    Patient drinks very little caffeine, maybe one cup daily.P   Patient has a college education.   Patient is currently not working.   Right-handed      PHYSICAL EXAM  Vitals:   07/02/19 0854  BP: 98/69  Pulse: 87  Temp: (!) 96.8 F (36 C)  Weight: 149 lb 3.2 oz (67.7 kg)  Height: 5\' 8"  (1.727 m)   Body mass index is 22.69 kg/m.  Generalized: Well developed, in no acute distress   Neurological examination  Mentation: Alert oriented to time, place, history taking. Follows all commands speech and language fluent Cranial nerve II-XII: Pupils were equal round reactive to light. Extraocular movements were full, visual field were full on confrontational test. . Head turning and shoulder shrug  were normal and symmetric. Motor: The motor testing reveals 5 over 5 strength of all 4 extremities. Good symmetric motor tone is noted throughout.  Sensory: Sensory testing is intact to soft touch on all 4 extremities. No evidence of extinction is noted.  Coordination: Cerebellar testing reveals good finger-nose-finger and heel-to-shin bilaterally.  Gait and station: Gait is normal.    DIAGNOSTIC DATA (LABS, IMAGING, TESTING) - I reviewed patient records, labs, notes, testing and imaging myself where available.  Lab Results  Component Value Date   WBC 11.1 (H) 05/25/2016   HGB 13.4 05/25/2016   HCT 39.4 05/25/2016   MCV 94.0 05/25/2016   PLT 357 05/25/2016      Component Value Date/Time   NA 137 05/25/2016 1840   K 3.5 05/25/2016 1840   CL 107 05/25/2016 1840   CO2 19 (L) 05/25/2016 1840   GLUCOSE 97 05/25/2016 1840   BUN 9 05/25/2016 1840   CREATININE 0.93 05/25/2016 1840   CALCIUM 9.0 05/25/2016 1840   PROT 7.5 05/25/2016 1840   ALBUMIN 3.8 05/25/2016 1840   AST 13 (L) 05/25/2016 1840   ALT 8 (L) 05/25/2016 1840   ALKPHOS 84 05/25/2016 1840   BILITOT 0.7 05/25/2016 1840   GFRNONAA  >60 05/25/2016 1840   GFRAA >60 05/25/2016 1840   No results found for: CHOL, HDL, LDLCALC, LDLDIRECT, TRIG, CHOLHDL Lab Results  Component Value Date   HGBA1C 5.1 04/19/2016   No results found for: VITAMINB12 Lab Results  Component Value Date   TSH 0.322 (L) 04/19/2016      ASSESSMENT AND PLAN 58 y.o. year old female  has a past medical history of Depression, History of total hysterectomy, Migraine with intractable migraine, and Restless leg syndrome. here with:  1.  Migraine headaches   Continue Topamx and gabapentin  Imitrex discontinued  Start Relpax-information given to the patient regarding potential side effects.  Advised to take 1 tablet at the onset of migraine and can repeat in 2 hours if needed.  Not to exceed 2 tablets in 24 hours  Discussed Botox again patient preferred to wait till after she sees her psychiatrist and counselor before proceeding.  Advised that if her symptoms worsen or she develops new symptoms she should let 46 know.  She will follow-up in 6 months or sooner if needed.   I spent 15 minutes with the patient. 50% of this time was spent reviewing plan of care   [place], MSN, NP-C 07/02/2019, 9:08 AM [facility] [address] [place], [place] [phone]

## 2019-07-04 ENCOUNTER — Telehealth: Payer: Self-pay | Admitting: Adult Health

## 2019-07-04 MED ORDER — FROVATRIPTAN SUCCINATE 2.5 MG PO TABS: 2.5000 mg | ORAL_TABLET | ORAL | 5 refills | Status: DC | PRN

## 2019-07-04 NOTE — Telephone Encounter (Signed)
I spoke to pt and she received frova from Dr. Melton Alar.  She has issues with imitress, abd pain, makes her sick.  Relpax not covered by insurance.

## 2019-07-04 NOTE — Telephone Encounter (Signed)
Pt is asking for a call to discuss going back on Frova, pt states it is covered by insurance and it does not make her sick. Please call to discuss

## 2019-07-04 NOTE — Telephone Encounter (Signed)
Frova sent.

## 2019-07-04 NOTE — Telephone Encounter (Addendum)
Looking on her med profile, I do not see that this was prescribed.  Are you ok to do [place]?  LMVM for pt.

## 2019-07-04 NOTE — Addendum Note (Signed)
Addended by: Trudie Buckler on: 07/04/2019 02:33 PM   Modules accepted: Orders

## 2019-07-04 NOTE — Telephone Encounter (Signed)
Pt has called to inform that the medication that was called in was the Relpax and not the Frova.  Pt states she has been without migraine medication for a month, please call

## 2019-07-04 NOTE — Telephone Encounter (Signed)
I LMVM for pt that we did send in rx for frova to [facility].  I gave her the particulars (when what time etc).

## 2019-07-05 ENCOUNTER — Telehealth: Payer: Self-pay

## 2019-07-05 ENCOUNTER — Other Ambulatory Visit: Payer: Self-pay

## 2019-07-05 ENCOUNTER — Other Ambulatory Visit: Payer: Self-pay | Admitting: Neurology

## 2019-07-05 ENCOUNTER — Ambulatory Visit (INDEPENDENT_AMBULATORY_CARE_PROVIDER_SITE_OTHER): Payer: Medicare Other | Admitting: Psychiatry

## 2019-07-05 DIAGNOSIS — F411 Generalized anxiety disorder: Secondary | ICD-10-CM | POA: Diagnosis not present

## 2019-07-05 DIAGNOSIS — F331 Major depressive disorder, recurrent, moderate: Secondary | ICD-10-CM

## 2019-07-05 DIAGNOSIS — G43511 Persistent migraine aura without cerebral infarction, intractable, with status migrainosus: Secondary | ICD-10-CM | POA: Diagnosis not present

## 2019-07-05 NOTE — Telephone Encounter (Signed)
PA has been submitted to [employer] part -D  (Key: [id number])  Your information has been submitted to [place]. [employer] [place] will review the request and notify you of the determination decision directly, typically within 3 business days of your submission and once all necessary information is received.  You will also receive your request decision electronically. To check for an update later, open the request again from your dashboard.  If [employer] [place] has not responded within the specified timeframe or if you have any questions about your PA submission, contact [place] directly at [facility]) [phone] or ([place]) [phone].

## 2019-07-05 NOTE — Telephone Encounter (Signed)
[employer] called and PA has been denied due to incorrect FDA use

## 2019-07-05 NOTE — Telephone Encounter (Signed)
PA for zofran has been completed. (Key: [id number])  Your information has been submitted to [place]. [employer] [place] will review the request and notify you of the determination decision directly, typically within 3 business days of your submission and once all necessary information is received.  You will also receive your request decision electronically. To check for an update later, open the request again from your dashboard.  If [employer] [place] has not responded within the specified timeframe or if you have any questions about your PA submission, contact [employer] directly at [facility]) [phone] or ([place]) [phone].

## 2019-07-05 NOTE — Telephone Encounter (Signed)
PA has been approved. Effective from 07/05/2019 through 07/04/2020.
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
## 2019-07-06 NOTE — Telephone Encounter (Signed)
PA was denied. I look over good rx and pt could receive 20 tabs for 14.94$. I have advised pharmacy to use this to fill pt's medication.

## 2019-07-13 ENCOUNTER — Encounter: Payer: Self-pay | Admitting: Physician Assistant

## 2019-07-13 ENCOUNTER — Ambulatory Visit (INDEPENDENT_AMBULATORY_CARE_PROVIDER_SITE_OTHER): Payer: Medicare Other | Admitting: Physician Assistant

## 2019-07-13 ENCOUNTER — Other Ambulatory Visit: Payer: Self-pay

## 2019-07-13 DIAGNOSIS — F331 Major depressive disorder, recurrent, moderate: Secondary | ICD-10-CM | POA: Diagnosis not present

## 2019-07-13 DIAGNOSIS — F411 Generalized anxiety disorder: Secondary | ICD-10-CM | POA: Diagnosis not present

## 2019-07-13 DIAGNOSIS — G43511 Persistent migraine aura without cerebral infarction, intractable, with status migrainosus: Secondary | ICD-10-CM

## 2019-07-13 MED ORDER — FLUOXETINE HCL 20 MG PO CAPS: 60.0000 mg | ORAL_CAPSULE | Freq: Every day | ORAL | 1 refills | Status: DC

## 2019-07-13 MED ORDER — ELETRIPTAN HYDROBROMIDE 40 MG PO TABS: 40.0000 mg | ORAL_TABLET | ORAL | 0 refills | Status: DC | PRN

## 2019-07-13 NOTE — Progress Notes (Signed)
[facility]  Patient ID: Amanda [name],  MRN: [id number]  PCP: Carol Ada, MD  Date of Evaluation: 07/13/2019 Time spent:25 minutes  Chief Complaint:  Chief Complaint    Follow-up      HISTORY/CURRENT STATUS: HPI Not doing so well.  Has been more depressed lately.  At least for a few months. She and her youngest daughter aren't getting along, for the last 7 years.  Her dtr blames her for everything wrong in her life. Her dtr verbally attacks her about things, like saying she didn't go to be with her to be fitted for her wedding dress. But it's not true.  "She told me not to go. So it's not true."   Pt wants to stay in bed a lot. Doesn't enjoy things.  "I haven't even wanted to decorate for Christmas. I couldn't care less." Her husband and 37 yo granddaughter decorated the tree. Has no energy or motivation. Cries easily. She does enjoy her dog Bear. Sleeps good. No SI/HI.   Is in pain all the time with migraines. Has 4-5 HA days/wk. Sees [facility]. D/T insurance, she can only take Imitrex, which hurts her stomach. Relpax has worked the best.   Review of Systems  Constitutional: Positive for malaise/fatigue.  HENT: Negative.   Eyes: Positive for photophobia.  Respiratory: Negative.   Cardiovascular: Negative.   Gastrointestinal: Positive for nausea.  Genitourinary: Negative.   Musculoskeletal: Negative.   Skin: Negative.   Neurological: Positive for headaches.  Endo/Heme/Allergies: Negative.   Psychiatric/Behavioral: Positive for depression. Negative for hallucinations, memory loss, substance abuse and suicidal ideas. The patient is not nervous/anxious and does not have insomnia.   The photophobia and nausea comes with the migraines.  Individual Medical History/ Review of Systems: Changes? :No    Past medications for mental health diagnoses include: Effexor  Allergies: Wellbutrin [bupropion] and Aspirin  Current Medications:  Current Outpatient  Medications:  .  aspirin-acetaminophen-caffeine (EXCEDRIN MIGRAINE) 250-250-65 MG tablet, Take 2 tablets by mouth every 6 (six) hours as needed for headache or migraine., Disp: , Rfl:  .  Calcium Carbonate-Vit D-Min (CALCIUM 1200 PO), Take 1,200 mg by mouth 2 (two) times daily. , Disp: , Rfl:  .  clonazePAM (KLONOPIN) 1 MG tablet, TAKE ONE TABLET BY MOUTH TWICE A DAY MAY TAKE ADDITIONAL TABLET IF NEEDED, Disp: 75 tablet, Rfl: 2 .  gabapentin (NEURONTIN) 300 MG capsule, TAKE THREE CAPSULES BY MOUTH EVERY NIGHT AT BEDTIME, Disp: 90 capsule, Rfl: 11 .  Magnesium 400 MG CAPS, Take 400 mg by mouth 2 (two) times daily. , Disp: , Rfl:  .  omeprazole (PRILOSEC) 20 MG capsule, Take 20 mg by mouth daily., Disp: , Rfl:  .  ondansetron (ZOFRAN) 8 MG tablet, TAKE ONE TABLET BY MOUTH EVERY 8 HOURS AS NEEDED FOR NAUSEA/ VOMITING, Disp: 30 tablet, Rfl: 0 .  SUMAtriptan (IMITREX) 100 MG tablet, , Disp: , Rfl:  .  topiramate (TOPAMAX) 50 MG tablet, Take 3 tablets (150 mg total) by mouth at bedtime., Disp: 90 tablet, Rfl: 11 .  eletriptan (RELPAX) 40 MG tablet, Take 1 tablet (40 mg total) by mouth as needed for migraine or headache. May repeat in 2 hours if headache persists or recurs., Disp: 10 tablet, Rfl: 0 .  FLUoxetine (PROZAC) 20 MG capsule, Take 3 capsules (60 mg total) by mouth daily., Disp: 90 capsule, Rfl: 1 .  frovatriptan (FROVA) 2.5 MG tablet, Take 1 tablet (2.5 mg total) by mouth as needed for migraine. If recurs,  may repeat after 2 hours. Max of 2 tabs in 24 hours. (Patient not taking: Reported on 07/13/2019), Disp: 10 tablet, Rfl: 5 Medication Side Effects: none  Family Medical/ Social History: Changes? No  MENTAL HEALTH EXAM:  There were no vitals taken for this visit.There is no height or weight on file to calculate BMI.  General Appearance: Casual, Neat and Well Groomed  Eye Contact:  Good  Speech:  Clear and Coherent  Volume:  Normal  Mood:  sad  Affect:  Appropriate  Thought Process:   Goal Directed and Descriptions of Associations: Intact  Orientation:  Full (Time, Place, and Person)  Thought Content: Logical   Suicidal Thoughts:  No  Homicidal Thoughts:  No  Memory:  WNL  Judgement:  Good  Insight:  Good  Psychomotor Activity:  Normal  Concentration:  Concentration: Good  Recall:  Good  Fund of Knowledge: Good  Language: Good  Assets:  Desire for Improvement  ADL's:  Intact  Cognition: WNL  Prognosis:  Good    DIAGNOSES:    ICD-10-CM   1. Major depressive disorder, recurrent episode, moderate (HCC)  F33.1   2. Generalized anxiety disorder  F41.1   3. Migraine aura, persistent, intractable, with status migrainosus  G43.511     Receiving Psychotherapy: Yes Dr. [name] Mitchum   RECOMMENDATIONS:  PDMP was reviewed. I spent 25 minutes with her face-to-face.  We discussed the diagnosis and treatment options.  50% of the time was in counseling. Restart Relpax 40 mg daily as needed migraine.  Good Rx information was given. Continue Klonopin 1 mg twice daily as needed. Increase Prozac to 20 mg, 3 every morning. DC Imitrex. Continue Topamax 50 mg, 3 p.o. daily. Continue therapy with Dr. Jim Desanctis. Return in 4 to 6 weeks.  Melony Overly, PA-C

## 2019-08-10 ENCOUNTER — Other Ambulatory Visit: Payer: Self-pay

## 2019-08-10 ENCOUNTER — Ambulatory Visit (INDEPENDENT_AMBULATORY_CARE_PROVIDER_SITE_OTHER): Payer: Medicare Other | Admitting: Psychiatry

## 2019-08-10 DIAGNOSIS — G43511 Persistent migraine aura without cerebral infarction, intractable, with status migrainosus: Secondary | ICD-10-CM | POA: Diagnosis not present

## 2019-08-10 DIAGNOSIS — Z62820 Parent-biological child conflict: Secondary | ICD-10-CM | POA: Diagnosis not present

## 2019-08-10 DIAGNOSIS — F331 Major depressive disorder, recurrent, moderate: Secondary | ICD-10-CM

## 2019-08-10 NOTE — Progress Notes (Signed)
Psychotherapy Progress Note [facility] Marliss Czar, PhD LP  Patient ID: Amanda [name]     MRN: [id number]     Therapy format: Individual psychotherapy Date: 08/10/2019      Start: 10:19a     Stop: 11:04a     Time Spent: 45 min Location: In-person   Session narrative (presenting needs, interim history, self-report of stressors and symptoms, applications of prior therapy, status changes, and interventions made in session) Migraine is on today.  More depressed lately.  Turned 59 yesterday, D [name] didn't call just recycled last year's card.  H tender with her, got her 3 crumb cakes (her favorite).  Other D Sarah called, got her kids to sing to PT, wrote something very nice on Facebook.  Most days does enjoy getting up for devotions and spending an hour with God, not had this morning due to getting up earlier.  Getting hair cut but doesn't feel like it.  Not suicidal, but really no will to live.  Discussed intractable stressors of chronic pain (currently in migraine, in fact) and D's criticism and rejection.   Oriented to learned helplessness phenomenon and normalized as a brain response to try to limit pain and conserve energy, not a moral failing, but trouble is, when the feeling/conditioning rules, it denies opportunity, prevent possibility, and entrenches disability and distress.  Oriented further to solution-focused/positive psychology viewpoint and goal of finding exceptions to the problem and to the cognitive therapy goal of tempering exaggerated and blaming thought patterns.  Reflected back how if "depression" was all there was to her, she would not have even gotten out of bed, b/c full-scale depression refuses all possibilities, takes no risks, allows no credit, and bullies her into complete submission, whereas she is rising anyway, praying for others' welfare, trying things, and it is life and love rather than depression that do those things.  As a [place], led to  consider that God may see and know all those things about her, even if she has a beef with God for allowing her to be in chronic pain, open to hearing it, in fact.  Discussed possibilities for today, activities she would be up to.  Headache and nausea permitting, she wants to try again to eat out for her birthday, with husband, since he made the effort, she does love him, and it's worth another try.  Discussed at some length [name]'s grudges and resentful way of relating, and what PT can legitimately do to change the dynamic.  Identified vulnerabilities being drawn into debate about how she really did as a mother, whether she was caring.  Also [name]'s extremes of defensiveness and blame, e.g., when she was going through PPD, denied it strongly, later blamed PT for not helping more.  Discussed possibility [name] inherited Bipolar Disorder or another genetic vulnerability through her father's side, as some valid, ego-sparing explanation.  Discussed "tongue fu" tactics for handling lose-lose communication patterns and perspective that recognizes the blame and binding [name] does as not necessarily what she believes but maybe a means to convey how frustrated and disturbed she feels in her own skin.  Again, ego-sparing, very plausible.  So far, they have an agreement to disagree about history, but it leaves relatively little to talk about.  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  Therapeutic modalities: Cognitive Behavioral Therapy, Solution-Oriented/Positive Psychology and Faith-sensitive  Mental Status/Observations:  Appearance:   Casual and Neat     Behavior:  Appropriate  Motor:  Normal  Speech/Language:   Clear and Coherent  Affect:  Appropriate and pained  Mood:  depressed  Thought process:  normal  Thought content:    WNL  Sensory/Perceptual disturbances:    WNL  Orientation:  Fully oriented  Attention:  Good  Concentration:  Fair and affected by  pain  Memory:  WNL  Insight:    Good  Judgment:   Good  Impulse Control:  Good   Risk Assessment: Danger to Self: No Self-injurious Behavior: No Danger to Others: No Physical Aggression / Violence: No Duty to Warn: No Access to Firearms a concern: No  Assessment of progress:  progressing  Diagnosis:   ICD-10-CM   1. Major depressive disorder, recurrent episode, moderate (HCC)  F33.1   2. Migraine aura, persistent, intractable, with status migrainosus  G43.511   3. Relationship problem between parent and child  Z62.820    Plan:  . Reframe negative thoughts about relationship with Evelena Peat, consider seeing her negativity as more illustration than punishment . Self-affirm exceptions to depression and demoralization . Second-chance birthday outing with H . Other recommendations/advice as noted above . Continue to utilize previously learned skills ad lib . Maintain medication as prescribed and work faithfully with relevant prescriber(s) if any changes are desired or seem indicated . Call the clinic on-call service, present to ER, or call 911 if any life-threatening psychiatric crisis Return in about 2 weeks (around 08/24/2019) for time as available. Next scheduled in this office 08/24/2019  Blanchie Serve, PhD Luan Moore, PhD LP Clinical Psychologist, [place] [facility] [address], [place], [place] [address] [phone]

## 2019-08-19 ENCOUNTER — Other Ambulatory Visit: Payer: Self-pay | Admitting: Adult Health

## 2019-08-23 DIAGNOSIS — K648 Other hemorrhoids: Secondary | ICD-10-CM | POA: Diagnosis not present

## 2019-08-23 DIAGNOSIS — Z1211 Encounter for screening for malignant neoplasm of colon: Secondary | ICD-10-CM | POA: Diagnosis not present

## 2019-08-23 DIAGNOSIS — R1084 Generalized abdominal pain: Secondary | ICD-10-CM | POA: Diagnosis not present

## 2019-08-23 DIAGNOSIS — K219 Gastro-esophageal reflux disease without esophagitis: Secondary | ICD-10-CM | POA: Diagnosis not present

## 2019-08-24 ENCOUNTER — Ambulatory Visit (INDEPENDENT_AMBULATORY_CARE_PROVIDER_SITE_OTHER): Payer: Medicare Other | Admitting: Physician Assistant

## 2019-08-24 ENCOUNTER — Other Ambulatory Visit: Payer: Self-pay

## 2019-08-24 ENCOUNTER — Encounter: Payer: Self-pay | Admitting: Physician Assistant

## 2019-08-24 DIAGNOSIS — F331 Major depressive disorder, recurrent, moderate: Secondary | ICD-10-CM | POA: Diagnosis not present

## 2019-08-24 DIAGNOSIS — G43511 Persistent migraine aura without cerebral infarction, intractable, with status migrainosus: Secondary | ICD-10-CM | POA: Diagnosis not present

## 2019-08-24 DIAGNOSIS — R634 Abnormal weight loss: Secondary | ICD-10-CM

## 2019-08-24 DIAGNOSIS — F411 Generalized anxiety disorder: Secondary | ICD-10-CM | POA: Diagnosis not present

## 2019-08-24 MED ORDER — CLONAZEPAM 1 MG PO TABS: ORAL_TABLET | ORAL | 5 refills | Status: DC

## 2019-08-24 MED ORDER — ELETRIPTAN HYDROBROMIDE 40 MG PO TABS: 40.0000 mg | ORAL_TABLET | ORAL | 5 refills | Status: DC | PRN

## 2019-08-24 MED ORDER — VENLAFAXINE HCL ER 150 MG PO CP24: 150.0000 mg | ORAL_CAPSULE | Freq: Every day | ORAL | 1 refills | Status: DC

## 2019-08-24 NOTE — Progress Notes (Signed)
[facility]  Patient ID: Amanda [name],  MRN: [id number]  PCP: Merri Brunette, MD  Date of Evaluation: 08/24/2019 Time spent:30 minutes  Chief Complaint:  Chief Complaint    Anxiety; Depression      HISTORY/CURRENT STATUS: HPI Not doing well.  See new ROS.   It's hard to tell if she's emotionally better or not.  She increased the Prozac up to 60 mg as we had discussed at her last visit, but her GI symptoms worsened.  She decreased back down to 40 mg.  She has been on Prozac for almost 4 years now.  I saw her initially in February 2017 at which time the Effexor had quit working and we changed to Prozac.  She would like to go back to Effexor if appropriate.  She is able to enjoy some things when she feels like it physically.  She enjoys being around her family as well as her dogs.  She is still having migraines at least a couple of days a week.  The Relpax does help.  She does not cry easily.  Energy and motivation are dependent upon how she feels physically.  No suicidal or homicidal thoughts.  Anxiety is mostly controlled with the Xanax.  She sleeps good most of the time.  Denies dizziness, syncope, seizures, numbness, tingling, tremor, tics, unsteady gait, slurred speech, confusion. Denies muscle or joint pain, stiffness, or dystonia.  Individual Medical History/ Review of Systems: Changes? :Yes   Has severe abdominal pain, saw a GI yesterday at [facility].  Has GERD, and was dx w/ gastroparesis 15 years ago. Pt states that looking back, she started losing weight, when we switched to Prozac.  She never felt like the side effects were worth mentioning as she was not sure what was going on.  Also has increased GERD, nausea and constipation.  She has an endoscopy and colonoscopy scheduled 2 weeks from now.   Past medications for mental health diagnoses include: Effexor, Wellbutrin, Prozac caused weight loss and GI  Allergies: Wellbutrin [bupropion] and  Aspirin  Current Medications:  Current Outpatient Medications:  .  aspirin-acetaminophen-caffeine (EXCEDRIN MIGRAINE) 250-250-65 MG tablet, Take 2 tablets by mouth every 6 (six) hours as needed for headache or migraine., Disp: , Rfl:  .  Calcium Carbonate-Vit D-Min (CALCIUM 1200 PO), Take 1,200 mg by mouth 2 (two) times daily. , Disp: , Rfl:  .  clonazePAM (KLONOPIN) 1 MG tablet, TAKE ONE TABLET BY MOUTH TWICE A DAY MAY TAKE ADDITIONAL TABLET IF NEEDED, Disp: 75 tablet, Rfl: 5 .  eletriptan (RELPAX) 40 MG tablet, Take 1 tablet (40 mg total) by mouth as needed for migraine or headache. May repeat in 2 hours if headache persists or recurs., Disp: 10 tablet, Rfl: 5 .  esomeprazole (NEXIUM) 20 MG capsule, Take 20 mg by mouth daily at 12 noon., Disp: , Rfl:  .  gabapentin (NEURONTIN) 300 MG capsule, TAKE THREE CAPSULES BY MOUTH EVERY NIGHT AT BEDTIME, Disp: 90 capsule, Rfl: 11 .  Magnesium 400 MG CAPS, Take 400 mg by mouth 2 (two) times daily. , Disp: , Rfl:  .  ondansetron (ZOFRAN) 8 MG tablet, TAKE ONE TABLET BY MOUTH EVERY 8 HOURS AS NEEDED FOR NAUSEA/ VOMITING, Disp: 30 tablet, Rfl: 0 .  topiramate (TOPAMAX) 50 MG tablet, Take 3 tablets (150 mg total) by mouth at bedtime., Disp: 90 tablet, Rfl: 11 .  omeprazole (PRILOSEC) 20 MG capsule, Take 20 mg by mouth daily., Disp: , Rfl:  .  [  START ON 09/10/2019] venlafaxine XR (EFFEXOR XR) 150 MG 24 hr capsule, Take 1 capsule (150 mg total) by mouth daily with breakfast., Disp: 30 capsule, Rfl: 1 Medication Side Effects: nausea and weight loss  Family Medical/ Social History: Changes? No  MENTAL HEALTH EXAM:  There were no vitals taken for this visit.There is no height or weight on file to calculate BMI.  General Appearance: Casual, Neat and Well Groomed  Eye Contact:  Good  Speech:  Clear and Coherent  Volume:  Normal  Mood:  Euthymic  Affect:  Appropriate  Thought Process:  Goal Directed and Descriptions of Associations: Intact  Orientation:  Full  (Time, Place, and Person)  Thought Content: Logical   Suicidal Thoughts:  No  Homicidal Thoughts:  No  Memory:  WNL  Judgement:  Good  Insight:  Good  Psychomotor Activity:  Normal  Concentration:  Concentration: Good  Recall:  Good  Fund of Knowledge: Good  Language: Good  Assets:  Desire for Improvement  ADL's:  Intact  Cognition: WNL  Prognosis:  Good    DIAGNOSES:    ICD-10-CM   1. Major depressive disorder, recurrent episode, moderate (HCC)  F33.1   2. Generalized anxiety disorder  F41.1   3. Migraine aura, persistent, intractable, with status migrainosus  G43.511   4. Loss of weight  R63.4     Receiving Psychotherapy: Yes  Dr. Jonni Sanger [place]   RECOMMENDATIONS:  I spent 30 minutes with her face-to-face.   PDMP was reviewed. We discussed the fact that a side effect of Prozac can be weight loss and any of the SSRIs can sometimes cause different GI symptoms like she describes.  I recommend we stop the Prozac and get back on Effexor since she took that for years with improvement.  She has been off of it almost 4 years now and sometimes just having a break from the medication like that is enough to bring about a good response when it is restarted. Stop Prozac. Start Effexor XR 150 mg, 1 p.o. daily, beginning 09/10/2019.  She understands that the Prozac weans itself but she does need to be off of it for a couple of weeks before starting the Effexor.  If for some reason her headaches start worsening due to a change in the serotonin level, she should call and we can go ahead and start the Effexor in 1 week or so.  She verbalizes understanding. Continue Klonopin 1 mg twice daily as needed with an occasional extra tablet during the day as needed. Continue Relpax 40 mg daily as needed migraine and may repeat in 2 hours if needed, not to exceed 2 pills in 24 hours. Continue gabapentin 300 mg, 3 p.o. nightly. Continue therapy as needed. Return in 6 weeks.  Donnal Moat, PA-C

## 2019-09-04 ENCOUNTER — Ambulatory Visit: Payer: Medicare Other | Admitting: Psychiatry

## 2019-09-05 ENCOUNTER — Other Ambulatory Visit: Payer: Self-pay | Admitting: Adult Health

## 2019-09-07 DIAGNOSIS — Z01818 Encounter for other preprocedural examination: Secondary | ICD-10-CM | POA: Diagnosis not present

## 2019-09-07 DIAGNOSIS — R1084 Generalized abdominal pain: Secondary | ICD-10-CM | POA: Diagnosis not present

## 2019-09-07 DIAGNOSIS — Z1211 Encounter for screening for malignant neoplasm of colon: Secondary | ICD-10-CM | POA: Diagnosis not present

## 2019-09-07 DIAGNOSIS — K648 Other hemorrhoids: Secondary | ICD-10-CM | POA: Diagnosis not present

## 2019-09-11 ENCOUNTER — Other Ambulatory Visit: Payer: Self-pay | Admitting: Physician Assistant

## 2019-09-14 DIAGNOSIS — K900 Celiac disease: Secondary | ICD-10-CM | POA: Diagnosis not present

## 2019-09-14 DIAGNOSIS — K2970 Gastritis, unspecified, without bleeding: Secondary | ICD-10-CM | POA: Diagnosis not present

## 2019-09-14 DIAGNOSIS — K2270 Barrett's esophagus without dysplasia: Secondary | ICD-10-CM | POA: Diagnosis not present

## 2019-09-14 DIAGNOSIS — A048 Other specified bacterial intestinal infections: Secondary | ICD-10-CM | POA: Diagnosis not present

## 2019-09-18 ENCOUNTER — Ambulatory Visit: Payer: Medicare Other | Admitting: Psychiatry

## 2019-10-03 ENCOUNTER — Ambulatory Visit (INDEPENDENT_AMBULATORY_CARE_PROVIDER_SITE_OTHER): Payer: Medicare Other | Admitting: Psychiatry

## 2019-10-03 ENCOUNTER — Other Ambulatory Visit: Payer: Self-pay

## 2019-10-03 DIAGNOSIS — Z62820 Parent-biological child conflict: Secondary | ICD-10-CM

## 2019-10-03 DIAGNOSIS — G43909 Migraine, unspecified, not intractable, without status migrainosus: Secondary | ICD-10-CM | POA: Diagnosis not present

## 2019-10-03 DIAGNOSIS — F331 Major depressive disorder, recurrent, moderate: Secondary | ICD-10-CM

## 2019-10-03 NOTE — Progress Notes (Signed)
Psychotherapy Progress Note [facility] Marliss Czar, PhD LP  Patient ID: Amanda [name]     MRN: [id number] Therapy format: Individual psychotherapy Date: 10/03/2019      Start: 5:10p     Stop: 6:00p     Time Spent: 50 min Location: In-person   Session narrative (presenting needs, interim history, self-report of stressors and symptoms, applications of prior therapy, status changes, and interventions made in session) Has had to cancel for stomach issues, finds that SSRI was exacerbating gastroparesis, now on Effexor faring some better.  Had combination endoscopy, colonoscopy, awaiting results.  Finds she has new insight into spiritual principle of trusting God in weakness after a length of time grudging not getting well, having frequent down time with migraine and gastroparesis.  Spends much time in prayer.    Main issue with D [name], "who dislikes me".  Has tried to probe what D's grudge is, gets distorted answers that  don't match her memory, e.g., of never being there for her programs/performances, or playing favorites with her sister (who used to claim same, the other way).  Some of what she notes comes down to having been down for intractable migraines, exacerbated by [name] reworking it into something that must ave been semi-intentional and to some degree an abandonment by choice -- encouraged that PT may ask, supportively, if [name] angry at her mother or her mother's headaches.  PT youngest of 70 and her oldest B, a conservative pastor, says [name] is somewhat against PT more or less because she is a [place] woman and they differ over things like the inerrancy of the Bible.  Discussed perceived differences at some length, concluding that daughter has jumped to some conclusions about PT's attitude toward her for these differences, though there may well have been some history of unknowing or unintentional -- seeming, anyway -- coercion, and D has a long history of  stubborn character and oppositionality back to childhood.  Asserts she is accepting of D's philosophical differences, would like her to recognize that she is not trying to coerce anything.  Discussed best-odds ways of recruiting D's trust and de-escalating understood defensiveness and modeled nondefensive responses to provocative comments she has heard and may expect.  Very consistent with PT's faith to approach her calmly/openly, ask, take a turn-the-other-cheek approach, give away her emotional "turn" being heard and validated to give same within a "customer service" approach to relationship and use assertive inquiry to either learn things she did not realize or lead D to recognize her own distortions in the process.  Some indication she can do that, when led noncombatively.  Therapeutic modalities: Cognitive Behavioral Therapy, Solution-Oriented/Positive Psychology and Assertiveness/Communication  Mental Status/Observations:  Appearance:   Casual and Neat     Behavior:  Appropriate  Motor:  Normal  Speech/Language:   Clear and Coherent  Affect:  Appropriate  Mood:  dysthymic and concerned  Thought process:  normal  Thought content:    WNL  Sensory/Perceptual disturbances:    WNL  Orientation:  Fully oriented  Attention:  Good  Concentration:  Good  Memory:  WNL  Insight:    Good  Judgment:   Good  Impulse Control:  Good   Risk Assessment: Danger to Self: No Self-injurious Behavior: No Danger to Others: No Physical Aggression / Violence: No Duty to Warn: No Access to Firearms a concern: No  Assessment of progress:  progressing  Diagnosis:   ICD-10-CM   1. Migraine syndrome  G43.909   2.  Major depressive disorder, recurrent episode, moderate (HCC)  F33.1   3. Relationship problem between parent and child  Z62.820    Plan:  Marland Kitchen Apply tips for noncombative, "customer-service" confrontations with Evelena Peat ad lib . If concerned about getting undivided attention, ask explicitly for a  time/place commitment to talk, frame it nonthreateningly as a time for PT to learn, connect dots, etc. . Other recommendations/advice as may be noted above . Continue to utilize previously learned skills ad lib . Maintain medication as prescribed and work faithfully with relevant prescriber(s) if any changes are desired or seem indicated . Call the clinic on-call service, present to ER, or call 911 if any life-threatening psychiatric crisis . Return in about 3 weeks (around 10/24/2019).Marland Kitchen  Next scheduled visit in this office 10/05/2019.  Blanchie Serve, PhD Luan Moore, PhD LP Clinical Psychologist, [facility] [address] [place], [place] [address] [phone]

## 2019-10-05 ENCOUNTER — Encounter: Payer: Self-pay | Admitting: Physician Assistant

## 2019-10-05 ENCOUNTER — Other Ambulatory Visit: Payer: Self-pay

## 2019-10-05 ENCOUNTER — Ambulatory Visit (INDEPENDENT_AMBULATORY_CARE_PROVIDER_SITE_OTHER): Payer: Medicare Other | Admitting: Physician Assistant

## 2019-10-05 DIAGNOSIS — G43909 Migraine, unspecified, not intractable, without status migrainosus: Secondary | ICD-10-CM | POA: Diagnosis not present

## 2019-10-05 DIAGNOSIS — F3341 Major depressive disorder, recurrent, in partial remission: Secondary | ICD-10-CM

## 2019-10-05 DIAGNOSIS — F411 Generalized anxiety disorder: Secondary | ICD-10-CM | POA: Diagnosis not present

## 2019-10-05 MED ORDER — VENLAFAXINE HCL ER 150 MG PO CP24: 150.0000 mg | ORAL_CAPSULE | Freq: Every day | ORAL | 5 refills | Status: DC

## 2019-10-05 NOTE — Progress Notes (Signed)
[facility]  Patient ID: Amanda [name],  MRN: [id number]  PCP: Merri Brunette, MD  Date of Evaluation: 10/05/2019 Time spent:20 minutes  Chief Complaint:  Chief Complaint    Depression; Anxiety; Follow-up      HISTORY/CURRENT STATUS: HPI For f//u after stopping the Prozac.  Is doing great off the Prozac! No nausea or vomiting, no diarrhea.  Is feeling a lot better since being off it.  "I feel really good.  I am happy that we went off of it and I am doing great on the Effexor."  Patient denies loss of interest in usual activities and is able to enjoy things.  Denies decreased energy or motivation.  Appetite has not changed.  No extreme sadness, tearfulness, or feelings of hopelessness.  Denies any changes in concentration, making decisions or remembering things.  Denies suicidal or homicidal thoughts.  Anxiety is well controlled with Klonopin.  She sleeps well most of the time and feels rested when she gets up.  Migraines are about the same.  She is not having anymore, or less, since we stopped the Prozac.  The Relpax does continue to help.  It does not completely get rid of them but it helps.  There is no change in severity of the migraines.  No visual disturbances, weakness or paresthesias in any extremities.  She does occasionally have facial droop when she has a severe migraine but it is no different than she is always had.  Denies dizziness, syncope, seizures, numbness, tingling, tremor, tics, unsteady gait, slurred speech, confusion. Denies muscle or joint pain, stiffness, or dystonia.  Individual Medical History/ Review of Systems: Changes? :Yes  Had endoscopy/colonoscopy since LOV.  Is seeing her GI today to get results.  Was told she has a hiatal hernia but that is all.  Allergies: Wellbutrin [bupropion] and Aspirin  Current Medications:  Current Outpatient Medications:  .  aspirin-acetaminophen-caffeine (EXCEDRIN MIGRAINE) 250-250-65 MG tablet, Take 2  tablets by mouth every 6 (six) hours as needed for headache or migraine., Disp: , Rfl:  .  Calcium Carbonate-Vit D-Min (CALCIUM 1200 PO), Take 1,200 mg by mouth 2 (two) times daily. , Disp: , Rfl:  .  clonazePAM (KLONOPIN) 1 MG tablet, TAKE ONE TABLET BY MOUTH TWICE A DAY MAY TAKE ADDITIONAL TABLET IF NEEDED, Disp: 75 tablet, Rfl: 5 .  eletriptan (RELPAX) 40 MG tablet, Take 1 tablet (40 mg total) by mouth as needed for migraine or headache. May repeat in 2 hours if headache persists or recurs., Disp: 10 tablet, Rfl: 5 .  esomeprazole (NEXIUM) 20 MG capsule, Take 20 mg by mouth daily at 12 noon., Disp: , Rfl:  .  gabapentin (NEURONTIN) 300 MG capsule, TAKE THREE CAPSULES BY MOUTH EVERY NIGHT AT BEDTIME, Disp: 90 capsule, Rfl: 11 .  Magnesium 400 MG CAPS, Take 400 mg by mouth 2 (two) times daily. , Disp: , Rfl:  .  ondansetron (ZOFRAN) 8 MG tablet, TAKE ONE TABLET BY MOUTH EVERY 8 HOURS FOR NAUSEA/ VOMITING, Disp: 30 tablet, Rfl: 0 .  promethazine (PHENERGAN) 12.5 MG tablet, , Disp: , Rfl:  .  topiramate (TOPAMAX) 50 MG tablet, Take 3 tablets (150 mg total) by mouth at bedtime., Disp: 90 tablet, Rfl: 11 .  venlafaxine XR (EFFEXOR XR) 150 MG 24 hr capsule, Take 1 capsule (150 mg total) by mouth daily with breakfast., Disp: 30 capsule, Rfl: 5 Medication Side Effects: none  Family Medical/ Social History: Changes? No  MENTAL HEALTH EXAM:  There were no vitals  taken for this visit.There is no height or weight on file to calculate BMI.  General Appearance: Casual, Neat and Well Groomed  Eye Contact:  Good  Speech:  Clear and Coherent and Normal Rate  Volume:  Normal  Mood:  Euthymic  Affect:  Appropriate  Thought Process:  Goal Directed and Descriptions of Associations: Intact  Orientation:  Full (Time, Place, and Person)  Thought Content: Logical   Suicidal Thoughts:  No  Homicidal Thoughts:  No  Memory:  WNL  Judgement:  Good  Insight:  Good  Psychomotor Activity:  Normal  Concentration:   Concentration: Good  Recall:  Good  Fund of Knowledge: Good  Language: Good  Assets:  Desire for Improvement  ADL's:  Intact  Cognition: WNL  Prognosis:  Good    DIAGNOSES:    ICD-10-CM   1. Recurrent major depressive disorder, in partial remission ([place])  F33.41   2. Generalized anxiety disorder  F41.1   3. Migraine syndrome  G43.909     Receiving Psychotherapy: Yes Dr Luan Moore prn   RECOMMENDATIONS:  PDMP was reviewed. I spent 20 minutes with her. I am glad to see her doing so much better! Continue Klonopin 1 mg, 1 p.o. twice daily as needed with an occasional afternoon dose as needed. Continue Relpax 40 mg, 1 p.o. daily as needed.  May repeat in 2 hours if headache persists but no more than 2 pills in 24 hours. Continue gabapentin 300 mg, 3 p.o. nightly.  Given by another provider. Continue Effexor XR 150 mg, 1 p.o. every morning. Continue therapy as needed. Return in 4 months.  Donnal Moat, PA-C

## 2019-10-08 ENCOUNTER — Other Ambulatory Visit: Payer: Self-pay | Admitting: Physician Assistant

## 2019-10-14 ENCOUNTER — Other Ambulatory Visit: Payer: Self-pay | Admitting: Physician Assistant

## 2019-10-17 ENCOUNTER — Ambulatory Visit: Payer: Medicare Other | Admitting: Psychiatry

## 2019-10-31 ENCOUNTER — Other Ambulatory Visit: Payer: Self-pay

## 2019-10-31 ENCOUNTER — Ambulatory Visit (INDEPENDENT_AMBULATORY_CARE_PROVIDER_SITE_OTHER): Payer: Medicare Other | Admitting: Psychiatry

## 2019-10-31 DIAGNOSIS — G43909 Migraine, unspecified, not intractable, without status migrainosus: Secondary | ICD-10-CM

## 2019-10-31 DIAGNOSIS — Z62820 Parent-biological child conflict: Secondary | ICD-10-CM

## 2019-10-31 DIAGNOSIS — F411 Generalized anxiety disorder: Secondary | ICD-10-CM | POA: Diagnosis not present

## 2019-10-31 DIAGNOSIS — F3341 Major depressive disorder, recurrent, in partial remission: Secondary | ICD-10-CM | POA: Diagnosis not present

## 2019-10-31 DIAGNOSIS — F5104 Psychophysiologic insomnia: Secondary | ICD-10-CM

## 2019-10-31 NOTE — Progress Notes (Signed)
Psychotherapy Progress Note [facility] Amanda Czar, PhD LP  Patient ID: Amanda Amanda [name]     MRN: [id number] Therapy format: Individual psychotherapy Date: 10/31/2019      Start: 9:15a     Stop: 10:03a     Time Spent: 48 min Location: In-person   Session narrative (presenting needs, interim history, self-report of stressors and symptoms, applications of prior therapy, status changes, and interventions made in session) Migraine today, but functional enough to attend.  Denies light bothers her, it's more about sound sensitivity.  Stopped Effexor due to dry mouth, and feeling pretty good, except she has had uncharacteristic insomnia, just this week.  Wonders if it's a withdrawal effect, but no electric-shock sensations, very unlikely it's withdrawal.  More attributes wakefulness to the dog snuggling up in bed and disturbing her, but it's still only this week, and the dog has been sleeping in for a year.  No points of worry or overconcern -- in fact things are going swimmingly with Amanda Amanda [name] now, since dropping in on her last week and having such a pleasant time that Amanda Amanda [name] texted it was one of her own best days ever.  When she can't sleep, Amanda Amanda [name] on her tablet and may write or read.    Discussed lifestyle factors, and sleep readiness.  Generally watches 3 hrs TV 8-11pm, 2 hrs [employer] (not disturbing, just trying to be caught up) and then [employer] on [employer].  Identified blue light contamination as most likely factor spoiling sound sleep, perhaps exacerbated the past week, or possibly riding something of a high since breaking through with Amanda Amanda [name].  Educated in light cues, circadian rhythm, and ways to control the timing of blue light to optimize body chemistry.  Very interested in trying amber lenses as a nonpharm option.  Says OTC melatonin gives her nightmares.  Hopeful for husband, as well,  who sleeps separately due to loud snoring and also Amanda [name] a great deal of  electronic light in the evening, will share.  Re. depression, knows she won't be able to stay off antidepressant for good.  15 years now of needing something, part time.  Anxiety much-relieved as things are going swimmingly with Amanda Amanda [name] now and they seem to have had a very reconciling talk.  Reviewed learnings for future reference.  Lesson taken how powerful it is for her to initiate contact, that Amanda Amanda [name] actually loves being dropped in on, while very averse to making plans.  Has learned some sore subjects (rivalry with sister Amanda Amanda [name]) and unwanted behavior (giving unsolicited advice -- noted by various family members) and avoids them.  Discussed whether it still needs reconciling about Amanda [name]'s complaint that PT never had time for things because she was so ill growing up.  Clear that it is not true that daughter does not like her, and presumably PT no longer believes that her daughter has an issue with her because she is a [place] woman, whatever philosophical differences they may actually have.    Discussed outlook for therapy, PT wants to make it monthly rather than biweekly due to expenses.  Therapeutic modalities: Cognitive Behavioral Therapy, Solution-Oriented/Positive Psychology and Assertiveness/Communication  Mental Status/Observations:  Appearance:   Casual and Neat     Behavior:  Appropriate  Motor:  Normal  Speech/Language:   Clear and Coherent  Affect:  Appropriate  Mood:  euthymic  Thought process:  normal  Thought content:    WNL  Sensory/Perceptual disturbances:    WNL, exc. headache pain  Orientation:  Fully oriented  Attention:  Good    Concentration:  Good  Memory:  WNL  Insight:    Good  Judgment:   Good  Impulse Control:  Good   Risk Assessment: Danger to Self: No Self-injurious Behavior: No Danger to Others: No Physical Aggression / Violence: No Duty to Warn: No Access to Firearms a concern: No  Assessment of progress:  progressing well  Diagnosis:    ICD-10-CM   1. Recurrent major depressive disorder, in partial remission (Amanda Amanda [name])  F33.41   2. Generalized anxiety disorder  F41.1   3. Migraine syndrome  G43.909   4. Relationship problem between parent and child  Z62.820   5. Psychophysiological insomnia  F51.04    Plan:  . Keep ready recommendations from last time, namely, to turn the other cheek, be ready to give away her emotional turn, and ask understanding questions if confronted or provoked again.  Be ready to ask, if moved, whether her daughter still sees her as unavailable or holding out in some way.   . Continue to practice of spontaneously getting in touch with Amanda Amanda [name] . Other recommendations/advice as may be noted above . Continue to utilize previously learned skills ad lib . Maintain medication as prescribed and work faithfully with relevant prescriber(s) if any changes are desired or seem indicated . Call the clinic on-call service, present to ER, or call 911 if any life-threatening psychiatric crisis Return in about 1 month (around 11/30/2019).   . Already scheduled visit in this office 11/14/2019.  Amanda Serve, PhD Amanda Moore, PhD LP Clinical Psychologist, [facility] [address], [place], [place] [address] [phone]

## 2019-11-07 DIAGNOSIS — Z01818 Encounter for other preprocedural examination: Secondary | ICD-10-CM | POA: Diagnosis not present

## 2019-11-07 DIAGNOSIS — K648 Other hemorrhoids: Secondary | ICD-10-CM | POA: Diagnosis not present

## 2019-11-11 ENCOUNTER — Other Ambulatory Visit: Payer: Self-pay | Admitting: Physician Assistant

## 2019-11-14 ENCOUNTER — Other Ambulatory Visit: Payer: Self-pay | Admitting: Physician Assistant

## 2019-11-14 ENCOUNTER — Ambulatory Visit: Payer: Medicare Other | Admitting: Psychiatry

## 2019-11-21 DIAGNOSIS — K625 Hemorrhage of anus and rectum: Secondary | ICD-10-CM | POA: Diagnosis not present

## 2019-11-21 DIAGNOSIS — Z01818 Encounter for other preprocedural examination: Secondary | ICD-10-CM | POA: Diagnosis not present

## 2019-11-21 DIAGNOSIS — Z1211 Encounter for screening for malignant neoplasm of colon: Secondary | ICD-10-CM | POA: Diagnosis not present

## 2019-11-21 DIAGNOSIS — K648 Other hemorrhoids: Secondary | ICD-10-CM | POA: Diagnosis not present

## 2019-11-30 DIAGNOSIS — K635 Polyp of colon: Secondary | ICD-10-CM | POA: Diagnosis not present

## 2019-12-04 ENCOUNTER — Other Ambulatory Visit: Payer: Self-pay

## 2019-12-04 ENCOUNTER — Ambulatory Visit (INDEPENDENT_AMBULATORY_CARE_PROVIDER_SITE_OTHER): Payer: Medicare Other | Admitting: Psychiatry

## 2019-12-04 DIAGNOSIS — F411 Generalized anxiety disorder: Secondary | ICD-10-CM

## 2019-12-04 DIAGNOSIS — F3341 Major depressive disorder, recurrent, in partial remission: Secondary | ICD-10-CM

## 2019-12-04 DIAGNOSIS — Z62820 Parent-biological child conflict: Secondary | ICD-10-CM | POA: Diagnosis not present

## 2019-12-04 NOTE — Progress Notes (Signed)
Psychotherapy Progress Note [facility] Amanda Czar, PhD LP  Patient ID: Amanda [name]     MRN: [id number] Therapy format: Individual psychotherapy Date: 12/04/2019      Start: 9:12a     Stop: 10:00a     Time Spent: 48 min Location: In-person   Session narrative (presenting needs, interim history, self-report of stressors and symptoms, applications of prior therapy, status changes, and interventions made in session) Feeling very well.  D Amanda [name] has been very open with her, now affirming of positives in her upbringing, calling during her drive time, much better for giving undivided attention.  Figures her new counseling is helpful for her attitude, really appreciated.  Affirmed PT has most likely helped her come to it by managing her own reactions.  D Amanda [name] has been out of touch since overspending on a car and missing Mother's Day.  Pt had advertised that she didn't want to celebrate M's Day due to her own M's death too recently, and wanting the girls to have their own celebrations as the mothers honored in their own families, but she figured both girls would get in touch sometime, and Amanda [name] did not get in touch at all.  Amanda [name] suffers from anxiety and depression, too, and has always been prone to rationalize impulsive choices.  Hx that she married a Arts development officer, Neurosurgeon, who is ACOA (mother the addict) and the both Amanda [name] and Amanda [name] tend to yell at their kids.  Concerned for the 6.5yo Amanda [name], who gets made responsible for picking up toys for both kids, and impression is that the kids are subjected to negativity pretty routinely.  Younger child Amanda [name] tends to throw tantrums easily.  Support/empathy provided.   Discussed concerns for upcoming family trip together.  Although the above behaviors and family dynamics are of some clear concern, PT says she would most want to probe Amanda [name]'s faith, know who he believes Amanda [name] is.  Knows the  opportunity may be limited, with child-care timing,  television preferences, etc., so has thought she would arrange an opportunity to question him by getting a group activity going using miniature conversation-starter cards she keeps on her key ring and insert the question, asking Amanda [name] to answer first (on the idea that it would keep him from trying to copy her husband Amanda [name]'s answer).  Apparently PT did not realize how manipulative and prosecutorial this plan would come across.  Supportively challenged and discussed alternatives, recommending that it be a private, 1:1 conversation, and that she introduce it as something she's been curious, wants to know him better, etc.  Advised that the overtone would be the kind of respect she means to show and receive, and that approaching it softly and openly would be much more of a witness if that's what she is after.  Agrees, appreciates the insight.  Therapeutic modalities: Cognitive Behavioral Therapy, Solution-Oriented/Positive Psychology, Faith-sensitive and Assertiveness/Communication  Mental Status/Observations:  Appearance:   Casual and Neat     Behavior:  Appropriate  Motor:  Normal  Speech/Language:   Clear and Coherent  Affect:  Appropriate  Mood:  normal  Thought process:  normal  Thought content:    WNL  Sensory/Perceptual disturbances:    WNL  Orientation:  Fully oriented  Attention:  Good    Concentration:  Good  Memory:  WNL  Insight:    Fair  Judgment:   Fair  Impulse Control:  Good   Risk Assessment: Danger to Self: No Self-injurious Behavior: No Danger to Others: No Physical  Aggression / Violence: No Duty to Warn: No Access to Firearms a concern: No  Assessment of progress:  progressing  Diagnosis:   ICD-10-CM   1. Recurrent major depressive disorder, in partial remission (Amanda [name])  F33.41   2. Generalized anxiety disorder  F41.1   3. Relationship problem between parent and child  Z62.820    Plan:  . Tips for sensitive communication with son in law . Continue warmer approach  to daughters . Other recommendations/advice as may be noted above . Continue to utilize previously learned skills ad lib . Maintain medication as prescribed and work faithfully with relevant prescriber(s) if any changes are desired or seem indicated . Call the clinic on-call service, present to ER, or call 911 if any life-threatening psychiatric crisis Return for time at discretion. . Already scheduled visit in this office 01/08/2020.  Amanda Serve, PhD Amanda Moore, PhD LP Clinical Psychologist, [facility] [address] [place], [place] [address] [phone]

## 2019-12-11 ENCOUNTER — Other Ambulatory Visit: Payer: Self-pay | Admitting: Adult Health

## 2019-12-17 ENCOUNTER — Telehealth: Payer: Self-pay | Admitting: Adult Health

## 2019-12-17 MED ORDER — NURTEC 75 MG PO TBDP: ORAL_TABLET | ORAL | 11 refills | Status: DC

## 2019-12-17 NOTE — Telephone Encounter (Signed)
I have ordered Nurtec for the patient.  Please explained to the patient that she can take 1 tablet at the onset of a migraine.  Only 1 tablet in 24 hours.

## 2019-12-17 NOTE — Telephone Encounter (Signed)
Called pt and discussed the Nurtec. Pt was disappointed that the savings card will not work with medicare plans but I let the pt know we will try a PA and see if insurance will cover. She understands she can take one at the onset of a migraine but the maximum is 1 tablet in 24 hours. She verbalized appreciation for the call.

## 2019-12-17 NOTE — Telephone Encounter (Signed)
Pt called to advise she would like to try the new medication nurtec ODT but was advised by [facility] [address] that prescription would need to be ordered and pt is traveling 2 days after being seen by NP and would like to know if it could be called in so there is sufficient amount of time to receive medication.

## 2019-12-18 NOTE — Telephone Encounter (Signed)
Nurtec PA completed on Cover My Meds. Key: [id number]. Awaiting BCBS Medicare determination.

## 2019-12-18 NOTE — Telephone Encounter (Signed)
Per Cover My Meds, Nurtec approved Effective from 12/18/2019 through 12/17/2020. I called the pt and LVM (ok per DPR) advising of approval for one year. Advised pt to call pharmacy to have this filled. Left office number for call back if needed.

## 2019-12-27 ENCOUNTER — Encounter: Payer: Self-pay | Admitting: Adult Health

## 2019-12-27 ENCOUNTER — Other Ambulatory Visit: Payer: Self-pay

## 2019-12-27 ENCOUNTER — Ambulatory Visit: Payer: Medicare Other | Admitting: Adult Health

## 2019-12-27 VITALS — BP 109/72 | HR 93 | Ht 68.0 in | Wt 159.0 lb

## 2019-12-27 DIAGNOSIS — G43709 Chronic migraine without aura, not intractable, without status migrainosus: Secondary | ICD-10-CM | POA: Diagnosis not present

## 2019-12-27 MED ORDER — GABAPENTIN 300 MG PO CAPS: 900.0000 mg | ORAL_CAPSULE | Freq: Every day | ORAL | 3 refills | Status: DC

## 2019-12-27 MED ORDER — ONDANSETRON HCL 8 MG PO TABS: ORAL_TABLET | ORAL | 1 refills | Status: DC

## 2019-12-27 NOTE — Progress Notes (Signed)
I have read the note, and I agree with the clinical assessment and plan.  Manna Gose K Rachelle Edwards   

## 2019-12-27 NOTE — Patient Instructions (Signed)
Your Plan:  Continue Gabapentin and Topamax If your symptoms worsen or you develop new symptoms please let us know.   Thank you for coming to see [place] at [facility]. I hope we have been able to provide you high quality care today.  You may receive a patient satisfaction survey over the next few weeks. We would appreciate your feedback and comments so that we may continue to improve ourselves and the health of our patients.

## 2019-12-27 NOTE — Progress Notes (Signed)
PATIENT: Amanda [name] DOB: [birth year]  REASON FOR VISIT: follow up HISTORY FROM: patient  HISTORY OF PRESENT ILLNESS: Today 12/27/19:  Amanda [name] is a 59 year old female with a history of intractable migraine headaches.  She returns today for follow-up.  She states that her headaches have been under better control.  She has approximately 2 migraines a week.  Sometimes they may last for 2 days.  Reports that she continues on gabapentin and Topamax.  She continues to use Relpax as an abortive therapy.  She feels that her headaches has improved and she is currently satisfied with the frequency.  HISTORY 07/02/19:  Amanda [name] is a 59 year old female with a history of intractable migraine headaches.  She returns today for follow-up.  She states that she tried to restart Aimovig but her insurance was switched to Medicare will not cover this.  She continues on gabapentin and Topamax.  She states that Imitrex was working well for her but has now caused her some stomach upset.  She would like to switch to Relpax.  She states that she has a headache 2-3 times a week.  The headaches can last 1 to 2 days.  Headaches can still be related to weather changes and certain foods.  She states that she has been reading about anxiety migraines.  She feels that this describes her.  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  She had a lot of anxiety that stems from going into peoples homes.  Now she does not like to go out or even have people over.  She states that she has set up a follow-up with her psychiatrist and is getting started with a counselor.  She returns today for an evaluation.   REVIEW OF SYSTEMS: Out of a complete 14 system review of symptoms, the patient complains only of the following symptoms, and all other reviewed systems are negative.  See HPI  ALLERGIES: Allergies  Allergen Reactions   Wellbutrin [Bupropion]     GERD   Aspirin Other (See Comments)    Makes  her ears ring    HOME MEDICATIONS: Outpatient Medications Prior to Visit  Medication Sig Dispense Refill   aspirin-acetaminophen-caffeine (EXCEDRIN MIGRAINE) 250-250-65 MG tablet Take 2 tablets by mouth every 6 (six) hours as needed for headache or migraine.     Calcium Carbonate-Vit D-Min (CALCIUM 1200 PO) Take 1,200 mg by mouth 2 (two) times daily.      clonazePAM (KLONOPIN) 1 MG tablet TAKE ONE TABLET BY MOUTH TWICE A DAY MAY TAKE ADDITIONAL TABLET IF NEEDED 75 tablet 5   eletriptan (RELPAX) 40 MG tablet Take 1 tablet (40 mg total) by mouth as needed for migraine or headache. May repeat in 2 hours if headache persists or recurs. 10 tablet 5   esomeprazole (NEXIUM) 20 MG capsule Take 20 mg by mouth daily at 12 noon.     gabapentin (NEURONTIN) 300 MG capsule TAKE THREE CAPSULES BY MOUTH EVERY NIGHT AT BEDTIME 90 capsule 11   Magnesium 400 MG CAPS Take 400 mg by mouth 2 (two) times daily.      ondansetron (ZOFRAN) 8 MG tablet TAKE ONE TABLET BY MOUTH EVERY 8 HOURS FOR NAUSEA/ VOMITING 30 tablet 0   promethazine (PHENERGAN) 12.5 MG tablet      Rimegepant Sulfate (NURTEC) 75 MG TBDP Take 1 tablet at the onset of a migraine.  Only 1 tablet in 24 hours 10 tablet 11   topiramate (TOPAMAX) 50 MG tablet TAKE THREE  TABLETS BY MOUTH EVERY NIGHT AT BEDTIME 90 tablet 10   venlafaxine XR (EFFEXOR XR) 150 MG 24 hr capsule Take 1 capsule (150 mg total) by mouth daily with breakfast. 30 capsule 5   No facility-administered medications prior to visit.    PAST MEDICAL HISTORY: Past Medical History:  Diagnosis Date   Depression    History of total hysterectomy    Migraine with intractable migraine    Restless leg syndrome     PAST SURGICAL HISTORY: Past Surgical History:  Procedure Laterality Date   TOTAL ABDOMINAL HYSTERECTOMY      FAMILY HISTORY: Family History  Problem Relation Age of Onset   Dementia Mother    Lung cancer Father    Migraines Sister    Migraines  Sister    Migraines Brother    Hypothyroidism Sister    Hypothyroidism Sister    Hypothyroidism Sister     SOCIAL HISTORY: Social History   Socioeconomic History   Marital status: Married    Spouse name: [name]   Number of children: 2   Years of education: 14   Highest education level: Not on file  Occupational History   Occupation: N/A  Tobacco Use   Smoking status: Never Smoker   Smokeless tobacco: Never Used  Substance and Sexual Activity   Alcohol use: Yes    Alcohol/week: 1.0 standard drinks    Types: 1 Glasses of wine per week    Comment: one a year   Drug use: No   Sexual activity: Not on file  Other Topics Concern   Not on file  Social History Narrative   Patient lives at home with her husbandGreig Castilla) and she is a Engineer, civil (consulting). The patient has 2 daughters.    Patient drinks very little caffeine, maybe one cup daily.P   Patient has a college education.   Patient is currently not working.   Right-handed   Social Determinants of Corporate investment banker Strain:    Difficulty of Paying Living Expenses:   Food Insecurity:    Worried About Programme researcher, broadcasting/film/video in the Last Year:    Barista in the Last Year:   Transportation Needs:    Freight forwarder (Medical):    Lack of Transportation (Non-Medical):   Physical Activity:    Days of Exercise per Week:    Minutes of Exercise per Session:   Stress:    Feeling of Stress :   Social Connections:    Frequency of Communication with Friends and Family:    Frequency of Social Gatherings with Friends and Family:    Attends Religious Services:    Active Member of Clubs or Organizations:    Attends Banker Meetings:    Marital Status:   Intimate Partner Violence:    Fear of Current or Ex-Partner:    Emotionally Abused:    Physically Abused:    Sexually Abused:       PHYSICAL EXAM  Vitals:   12/27/19 0903  BP: 109/72  Pulse: 93  Weight: 159 lb (72.1  kg)  Height: 5\' 8"  (1.727 m)   Body mass index is 24.18 kg/m.  Generalized: Well developed, in no acute distress   Neurological examination  Mentation: Alert oriented to time, place, history taking. Follows all commands speech and language fluent Cranial nerve II-XII: Pupils were equal round reactive to light. Extraocular movements were full, visual field were full on confrontational test. Facial sensation and strength were normal. Uvula tongue  midline. Head turning and shoulder shrug  were normal and symmetric. Motor: The motor testing reveals 5 over 5 strength of all 4 extremities. Good symmetric motor tone is noted throughout.  Sensory: Sensory testing is intact to soft touch on all 4 extremities. No evidence of extinction is noted.  Coordination: Cerebellar testing reveals good finger-nose-finger and heel-to-shin bilaterally.  Gait and station: Gait is normal.  Reflexes: Deep tendon reflexes are symmetric and normal bilaterally.   DIAGNOSTIC DATA (LABS, IMAGING, TESTING) - I reviewed patient records, labs, notes, testing and imaging myself where available.  Lab Results  Component Value Date   WBC 11.1 (H) 05/25/2016   HGB 13.4 05/25/2016   HCT 39.4 05/25/2016   MCV 94.0 05/25/2016   PLT 357 05/25/2016      Component Value Date/Time   NA 137 05/25/2016 1840   K 3.5 05/25/2016 1840   CL 107 05/25/2016 1840   CO2 19 (L) 05/25/2016 1840   GLUCOSE 97 05/25/2016 1840   BUN 9 05/25/2016 1840   CREATININE 0.93 05/25/2016 1840   CALCIUM 9.0 05/25/2016 1840   PROT 7.5 05/25/2016 1840   ALBUMIN 3.8 05/25/2016 1840   AST 13 (L) 05/25/2016 1840   ALT 8 (L) 05/25/2016 1840   ALKPHOS 84 05/25/2016 1840   BILITOT 0.7 05/25/2016 1840   GFRNONAA >60 05/25/2016 1840   GFRAA >60 05/25/2016 1840   No results found for: CHOL, HDL, LDLCALC, LDLDIRECT, TRIG, CHOLHDL Lab Results  Component Value Date   HGBA1C 5.1 04/19/2016   No results found for: VITAMINB12 Lab Results  Component  Value Date   TSH 0.322 (L) 04/19/2016      ASSESSMENT AND PLAN 59 y.o. year old female  has a past medical history of Depression, History of total hysterectomy, Migraine with intractable migraine, and Restless leg syndrome. here with:  Migraine headaches   Continue Topamax and gabapentin  Continue Relpax currently prescribed by her PCP  Advised if symptoms worsen or she develops new symptoms she should let us know  Follow-up in 6 months or sooner if needed   I spent 20 minutes of face-to-face and non-face-to-face time with patient.  This included previsit chart review, lab review, study review, order entry, electronic health record documentation, patient education.  Ward Givens, MSN, NP-C 12/27/2019, 9:12 AM [facility] [address], [place], [place] [address] [phone]

## 2019-12-31 ENCOUNTER — Ambulatory Visit: Payer: Self-pay | Admitting: Adult Health

## 2020-01-08 ENCOUNTER — Ambulatory Visit: Payer: Medicare Other | Admitting: Psychiatry

## 2020-01-10 ENCOUNTER — Ambulatory Visit: Payer: Medicare Other | Admitting: Psychiatry

## 2020-01-30 ENCOUNTER — Ambulatory Visit: Payer: Medicare Other | Admitting: Psychiatry

## 2020-02-04 ENCOUNTER — Encounter: Payer: Self-pay | Admitting: Physician Assistant

## 2020-02-04 ENCOUNTER — Other Ambulatory Visit: Payer: Self-pay

## 2020-02-04 ENCOUNTER — Ambulatory Visit (INDEPENDENT_AMBULATORY_CARE_PROVIDER_SITE_OTHER): Payer: Medicare Other | Admitting: Physician Assistant

## 2020-02-04 DIAGNOSIS — G43909 Migraine, unspecified, not intractable, without status migrainosus: Secondary | ICD-10-CM

## 2020-02-04 DIAGNOSIS — F411 Generalized anxiety disorder: Secondary | ICD-10-CM

## 2020-02-04 DIAGNOSIS — F3341 Major depressive disorder, recurrent, in partial remission: Secondary | ICD-10-CM

## 2020-02-04 MED ORDER — CLONAZEPAM 1 MG PO TABS: ORAL_TABLET | ORAL | 5 refills | Status: DC

## 2020-02-04 MED ORDER — ELETRIPTAN HYDROBROMIDE 40 MG PO TABS: 40.0000 mg | ORAL_TABLET | ORAL | 5 refills | Status: DC | PRN

## 2020-02-04 NOTE — Progress Notes (Signed)
[facility]  Patient ID: Amanda [name],  MRN: [id number]  PCP: Merri Brunette, MD  Date of Evaluation: 02/04/2020 Time spent:20 minutes  Chief Complaint:  Chief Complaint    Follow-up      HISTORY/CURRENT STATUS: HPI  For routine med check.  Has a new rescue dog and she is very happy about that.  She will be taking at [facility] this morning and is in a little bit of a hurry during this appointment.  States the medications are working well and she does not feel like any changes need to be made.  Patient denies loss of interest in usual activities and is able to enjoy things.  Denies decreased energy or motivation.  Appetite has not changed.  No extreme sadness, tearfulness, or feelings of hopelessness.  Denies any changes in concentration, making decisions or remembering things.  Denies suicidal or homicidal thoughts.  Anxiety is well controlled with Klonopin.  She sleeps well most of the time and feels rested when she gets up.  Migraines are still an issue but the prophylactic and rescue medications still work.  Denies dizziness, syncope, seizures, numbness, tingling, tremor, tics, unsteady gait, slurred speech, confusion. Denies muscle or joint pain, stiffness, or dystonia.  Individual Medical History/ Review of Systems: Changes? :No    Allergies: Wellbutrin [bupropion] and Aspirin  Current Medications:  Current Outpatient Medications:  .  aspirin-acetaminophen-caffeine (EXCEDRIN MIGRAINE) 250-250-65 MG tablet, Take 2 tablets by mouth every 6 (six) hours as needed for headache or migraine., Disp: , Rfl:  .  Calcium Carbonate-Vit D-Min (CALCIUM 1200 PO), Take 1,200 mg by mouth 2 (two) times daily. , Disp: , Rfl:  .  cholecalciferol (VITAMIN D3) 25 MCG (1000 UNIT) tablet, Take 1,000 Units by mouth daily., Disp: , Rfl:  .  clonazePAM (KLONOPIN) 1 MG tablet, TAKE ONE TABLET BY MOUTH TWICE A DAY MAY TAKE ADDITIONAL TABLET IF NEEDED, Disp: 75 tablet, Rfl: 5 .   eletriptan (RELPAX) 40 MG tablet, Take 1 tablet (40 mg total) by mouth as needed for migraine or headache. May repeat in 2 hours if headache persists or recurs., Disp: 10 tablet, Rfl: 5 .  esomeprazole (NEXIUM) 20 MG capsule, Take 20 mg by mouth daily at 12 noon., Disp: , Rfl:  .  gabapentin (NEURONTIN) 300 MG capsule, Take 3 capsules (900 mg total) by mouth at bedtime., Disp: 270 capsule, Rfl: 3 .  Magnesium 400 MG CAPS, Take 400 mg by mouth 2 (two) times daily. , Disp: , Rfl:  .  ondansetron (ZOFRAN) 8 MG tablet, TAKE ONE TABLET BY MOUTH EVERY 8 HOURS FOR NAUSEA/ VOMITING, Disp: 30 tablet, Rfl: 1 .  promethazine (PHENERGAN) 12.5 MG tablet, , Disp: , Rfl:  .  topiramate (TOPAMAX) 50 MG tablet, TAKE THREE TABLETS BY MOUTH EVERY NIGHT AT BEDTIME, Disp: 90 tablet, Rfl: 10 .  venlafaxine XR (EFFEXOR XR) 150 MG 24 hr capsule, Take 1 capsule (150 mg total) by mouth daily with breakfast., Disp: 30 capsule, Rfl: 5 Medication Side Effects: none  Family Medical/ Social History: Changes? No  MENTAL HEALTH EXAM:  There were no vitals taken for this visit.There is no height or weight on file to calculate BMI.  General Appearance: Casual, Neat and Well Groomed  Eye Contact:  Good  Speech:  Clear and Coherent and Normal Rate  Volume:  Normal  Mood:  Euthymic  Affect:  Appropriate  Thought Process:  Goal Directed and Descriptions of Associations: Intact  Orientation:  Full (Time, Place, and Person)  Thought Content: Logical   Suicidal Thoughts:  No  Homicidal Thoughts:  No  Memory:  WNL  Judgement:  Good  Insight:  Good  Psychomotor Activity:  Normal  Concentration:  Concentration: Good and Attention Span: Good  Recall:  Good  Fund of Knowledge: Good  Language: Good  Assets:  Desire for Improvement  ADL's:  Intact  Cognition: WNL  Prognosis:  Good    DIAGNOSES:    ICD-10-CM   1. Recurrent major depressive disorder, in partial remission (HCC)  F33.41   2. Generalized anxiety disorder   F41.1   3. Migraine syndrome  G43.909     Receiving Psychotherapy: Yes Dr Marliss Czar prn   RECOMMENDATIONS:  PDMP was reviewed. I provided 20 minutes of face-to-face time during this encounter. I am glad to see her doing so much better! Continue Klonopin 1 mg, 1 p.o. twice daily as needed with an occasional afternoon dose as needed. Continue Relpax 40 mg, 1 p.o. daily as needed.  May repeat in 2 hours if headache persists but no more than 2 pills in 24 hours. Continue gabapentin 300 mg, 3 p.o. nightly.  Given by another provider. Continue Effexor XR 150 mg, 1 p.o. every morning. Continue Prozac 50 mg, 3 p.o. nightly.  Given by another provider. Continue therapy as needed. Return in 6 months.  Melony Overly, PA-C

## 2020-02-26 ENCOUNTER — Other Ambulatory Visit: Payer: Self-pay | Admitting: Adult Health

## 2020-03-19 DIAGNOSIS — R110 Nausea: Secondary | ICD-10-CM | POA: Diagnosis not present

## 2020-03-19 DIAGNOSIS — K5909 Other constipation: Secondary | ICD-10-CM | POA: Diagnosis not present

## 2020-03-19 DIAGNOSIS — R350 Frequency of micturition: Secondary | ICD-10-CM | POA: Diagnosis not present

## 2020-04-08 ENCOUNTER — Ambulatory Visit (INDEPENDENT_AMBULATORY_CARE_PROVIDER_SITE_OTHER): Payer: Medicare Other | Admitting: Psychiatry

## 2020-04-08 ENCOUNTER — Other Ambulatory Visit: Payer: Self-pay

## 2020-04-08 DIAGNOSIS — F411 Generalized anxiety disorder: Secondary | ICD-10-CM | POA: Diagnosis not present

## 2020-04-08 DIAGNOSIS — Z62820 Parent-biological child conflict: Secondary | ICD-10-CM | POA: Diagnosis not present

## 2020-04-08 DIAGNOSIS — Z630 Problems in relationship with spouse or partner: Secondary | ICD-10-CM

## 2020-04-08 DIAGNOSIS — G43909 Migraine, unspecified, not intractable, without status migrainosus: Secondary | ICD-10-CM

## 2020-04-08 DIAGNOSIS — F3341 Major depressive disorder, recurrent, in partial remission: Secondary | ICD-10-CM | POA: Diagnosis not present
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
## 2020-04-09 ENCOUNTER — Other Ambulatory Visit: Payer: Self-pay | Admitting: Physician Assistant

## 2020-04-09 NOTE — Telephone Encounter (Signed)
Review.

## 2020-04-29 DIAGNOSIS — N390 Urinary tract infection, site not specified: Secondary | ICD-10-CM | POA: Diagnosis not present

## 2020-04-29 DIAGNOSIS — R159 Full incontinence of feces: Secondary | ICD-10-CM | POA: Diagnosis not present

## 2020-05-02 ENCOUNTER — Ambulatory Visit: Payer: Medicare Other | Admitting: Psychiatry

## 2020-05-15 DIAGNOSIS — R399 Unspecified symptoms and signs involving the genitourinary system: Secondary | ICD-10-CM | POA: Diagnosis not present

## 2020-05-15 DIAGNOSIS — R350 Frequency of micturition: Secondary | ICD-10-CM | POA: Diagnosis not present

## 2020-05-23 ENCOUNTER — Other Ambulatory Visit: Payer: Self-pay

## 2020-05-23 ENCOUNTER — Ambulatory Visit (INDEPENDENT_AMBULATORY_CARE_PROVIDER_SITE_OTHER): Payer: Medicare Other | Admitting: Psychiatry

## 2020-05-23 DIAGNOSIS — F411 Generalized anxiety disorder: Secondary | ICD-10-CM

## 2020-05-23 DIAGNOSIS — F3341 Major depressive disorder, recurrent, in partial remission: Secondary | ICD-10-CM

## 2020-05-23 DIAGNOSIS — Z630 Problems in relationship with spouse or partner: Secondary | ICD-10-CM

## 2020-05-23 DIAGNOSIS — Z62820 Parent-biological child conflict: Secondary | ICD-10-CM

## 2020-05-23 DIAGNOSIS — G43909 Migraine, unspecified, not intractable, without status migrainosus: Secondary | ICD-10-CM

## 2020-05-23 NOTE — Progress Notes (Signed)
Psychotherapy Progress Note [facility] Marliss Czar, PhD LP  Patient ID: Amanda [name]     MRN: [id number] Therapy format: Individual psychotherapy Date: 05/23/2020      Start: 11:06a     Stop: 11:51a     Time Spent: 45 min Location: In-person   Session narrative (presenting needs, interim history, self-report of stressors and symptoms, applications of prior therapy, status changes, and interventions made in session) Tried the line, "I know you hurt; please don't take it out on me" but did not help.  Feels she just got targeted the more.  Continued c/o H being irritable, yelling, accusing her of not listening when either he didn't say something in the first place or her memory failed in the second, and jumping to conclusions when she tries to explain herself, cutting her off, or telling her to go away while claiming he loves her dearly.  Also being cutting about finances, saying if she's worried about money, go get a job (when she is on disability, validly, for frequent migraines).  Also not backing her up with Amanda [name], who has relapsed in being very hurtful lately.  Multiple stresses, including more migraines, dealing with a cracked crown now, plus Amanda [name] putting up what she calls "boundaries", i.e., declaring she doesn't have the bandwidth to hear about her mother's headache or dental problems.  By Pt's account, she is not proclaiming pain or problems in any way, just tried to call her, didn't expect her to pick up, and when Amanda [name] did, she included these things in her news.  Amanda [name] rang up her father to declare she couldn't/wouldn't do that kind of interaction any more, and Amanda [name] passed along Amanda [name] message, contributing to what is now a longterm cycle of Amanda [name] feeling demonized and ganged up on.  Support/empathy provided.   Discussed the dynamics, Amanda [name]'s expectations, and what else may work to solicit curiosity from family members.  Largely, encouraged be slow  to explain or justify and more ready to ask what makes it look that way if accused of complaining, badgering, etc.    Therapeutic modalities: Cognitive Behavioral Therapy and Solution-Oriented/Positive Psychology  Mental Status/Observations:  Appearance:   Casual and Neat     Behavior:  Appropriate  Motor:  Normal  Speech/Language:   Clear and Coherent  Affect:  Appropriate  Mood:  irritable and sad  Thought process:  normal  Thought content:    WNL  Sensory/Perceptual disturbances:    WNL  Orientation:  Fully oriented  Attention:  Good    Concentration:  Good  Memory:  WNL  Insight:    Good  Judgment:   Good  Impulse Control:  Good   Risk Assessment: Danger to Self: No Self-injurious Behavior: No Danger to Others: No Physical Aggression / Violence: No Duty to Warn: No Access to Firearms a concern: No  Assessment of progress:  situational setback(s)  Diagnosis:   ICD-10-CM   1. Recurrent major depressive disorder, in partial remission (HCC)  F33.41   2. Generalized anxiety disorder  F41.1   3. Migraine syndrome  G43.909   4. Relationship problem between parent and child  Z62.820   5. Relationship problem between partners  Z63.0    Plan:  . Emphasize asking about hostile perceptions from family rather than defend/explain . Option ask Amanda [name] to consider how things come across, or proactively ask him what he needs from her . Other recommendations/advice as may be noted above . Continue to utilize previously learned skills  ad lib . Maintain medication as prescribed and work faithfully with relevant prescriber(s) if any changes are desired or seem indicated . Call the clinic on-call service, present to ER, or call 911 if any life-threatening psychiatric crisis Return in about 2 weeks (around 06/06/2020) for time as available. . Already scheduled visit in this office 08/06/2020.  Robley Fries, PhD Marliss Czar, PhD LP Clinical Psychologist, [facility]  [facility] [address] [place], [place] [address] [phone]

## 2020-06-11 ENCOUNTER — Other Ambulatory Visit: Payer: Self-pay

## 2020-06-11 ENCOUNTER — Ambulatory Visit (INDEPENDENT_AMBULATORY_CARE_PROVIDER_SITE_OTHER): Payer: Medicare Other | Admitting: Psychiatry

## 2020-06-11 DIAGNOSIS — F3341 Major depressive disorder, recurrent, in partial remission: Secondary | ICD-10-CM | POA: Diagnosis not present

## 2020-06-11 DIAGNOSIS — F411 Generalized anxiety disorder: Secondary | ICD-10-CM | POA: Diagnosis not present

## 2020-06-11 DIAGNOSIS — Z62820 Parent-biological child conflict: Secondary | ICD-10-CM | POA: Diagnosis not present

## 2020-06-11 DIAGNOSIS — Z630 Problems in relationship with spouse or partner: Secondary | ICD-10-CM

## 2020-06-11 NOTE — Progress Notes (Signed)
Psychotherapy Progress Note [facility] Marliss Czar, PhD LP  Patient ID: Amanda [name]     MRN: [id number] Therapy format: Individual psychotherapy Date: 06/11/2020      Start: 9:28a     Stop: 10:19a     Time Spent: 51 min Location: In-person   Session narrative (presenting needs, interim history, self-report of stressors and symptoms, applications of prior therapy, status changes, and interventions made in session) Amanda [name] being nicer, figures his pain is improved  Focal interest in dealing with Amanda [name] today.  Habit of bringing takeout for dinner when seeing her and her family, which Amanda [name] indulges, and hx of Amanda [name] objecting to things Amanda [name] herself makes, contributing to sense of rejection.  Wants Amanda [name] to broach the subject of economizing on the food they bring, e.g., by restoring homemade, but also at issue how he is out of touch with responsibility for managing money, which she does as part of an agreed-upon homemaker role.  Meanwhile, getting tired of insular, text-based, one-sided relationship with Amanda [name], and wrote as much to her recently.  Prone to criticize a lot of things about each.  Advised about two communication/conflict tools -- explicit consent to have a challenging conversation with a loved one and checking perceptions before reacting/explaining.  Amanda [name] expects her to preach and complain, presumably based on past experience, so both of those will certainly get her attention quickly but negatively.  Changing Amanda [name]'s expectations may hinge on showing her the unexpected, which frequently comes down to questions rather than reactions.  Illustrated/modeled.  Discussed TG plans and expectations.  Therapeutic modalities: Cognitive Behavioral Therapy and Solution-Oriented/Positive Psychology  Mental Status/Observations:  Appearance:   Casual and Neat     Behavior:  Appropriate  Motor:  Normal  Speech/Language:   Clear and Coherent  Affect:  Appropriate   Mood:  dysthymic  Thought process:  normal  Thought content:    WNL  Sensory/Perceptual disturbances:    WNL  Orientation:  Fully oriented  Attention:  Good    Concentration:  Good  Memory:  WNL  Insight:    Fair  Judgment:   Good  Impulse Control:  Good   Risk Assessment: Danger to Self: No Self-injurious Behavior: No Danger to Others: No Physical Aggression / Violence: No Duty to Warn: No Access to Firearms a concern: No  Assessment of progress:  stabilized  Diagnosis:   ICD-10-CM   1. Recurrent major depressive disorder, in partial remission (HCC)  F33.41   2. Relationship problem between parent and child  Z62.820   3. Relationship problem between partners  Z63.0   4. Generalized anxiety disorder  F41.1    Plan:  . Try questions first -- consent to challenge, check perceptions . Permission/encouragement to reduce expectations for holiday . Other recommendations/advice as may be noted above . Continue to utilize previously learned skills ad lib . Maintain medication as prescribed and work faithfully with relevant prescriber(s) if any changes are desired or seem indicated . Call the clinic on-call service, present to ER, or call 911 if any life-threatening psychiatric crisis Return in about 3 weeks (around 07/02/2020). . Already scheduled visit in this office 07/02/2020.  Robley Fries, PhD Marliss Czar, PhD LP Clinical Psychologist, [facility] [address] [place], [place] [address] [phone]

## 2020-06-24 DIAGNOSIS — K5909 Other constipation: Secondary | ICD-10-CM | POA: Diagnosis not present

## 2020-06-24 DIAGNOSIS — R110 Nausea: Secondary | ICD-10-CM | POA: Diagnosis not present

## 2020-07-02 ENCOUNTER — Other Ambulatory Visit: Payer: Self-pay

## 2020-07-02 ENCOUNTER — Ambulatory Visit (INDEPENDENT_AMBULATORY_CARE_PROVIDER_SITE_OTHER): Payer: Medicare Other | Admitting: Psychiatry

## 2020-07-02 ENCOUNTER — Other Ambulatory Visit: Payer: Self-pay | Admitting: Adult Health

## 2020-07-02 DIAGNOSIS — F3341 Major depressive disorder, recurrent, in partial remission: Secondary | ICD-10-CM

## 2020-07-02 DIAGNOSIS — Z630 Problems in relationship with spouse or partner: Secondary | ICD-10-CM | POA: Diagnosis not present

## 2020-07-02 DIAGNOSIS — Z62820 Parent-biological child conflict: Secondary | ICD-10-CM

## 2020-07-02 DIAGNOSIS — F411 Generalized anxiety disorder: Secondary | ICD-10-CM | POA: Diagnosis not present

## 2020-07-02 NOTE — Progress Notes (Signed)
Psychotherapy Progress Note [facility] Marliss Czar, PhD LP  Patient ID: Amanda [name]     MRN: [id number] Therapy format: Individual psychotherapy Date: 07/02/2020      Start: 9:20a     Stop: 10:10a     Time Spent: 50 min Location: In-person   Session narrative (presenting needs, interim history, self-report of stressors and symptoms, applications of prior therapy, status changes, and interventions made in session) Relationship with Amanda [name] much better all of a sudden.  Messaging by text a lot, helping and sharing more.  Some credit, apparently, to Amanda [name]'s stress (kids and dog sick, her husband out of town on recreation) driving her to seek support and to [place], both for helping tangibly (cleaning up diarrhea, using her own stethoscope to listen to GD Thea's lungs) and to validating Amanda [name] when she needed to express stress.  Meanwhile, Amanda [name] has listened to medical advice now to get his hand treated by a specialist, and his pain specialist validated the need.  In part vindicating of Amanda [name]'s judgment, but mostly just glad to have more cooperative spirit all of a sudden.  Affirmed and encouraged in proactive care of family members where possible and making sure to reward D and H with pleasant, natural interactions when they risk being vulnerable with her.  Current stress with dog, Amanda [name].  They have a long history of loving and caring for rescue dogs and a re well-versed in the emotional risks, having gone to the point of euthanizing 10 times.  Three dogs currently in the home, and Amanda [name] joined the family in June, has massive jaws and oddities like 6 toes.  History of being a bait dog for a pit bull operation and has tendency to clamp on another dog's neck in ways that get scary and need owner intervention to break up.  He will also damage things like a curtain and a photo album without warning, but the primary risk is that he may be aggressive with small children and damage  things that are irreplaceable.  Discussed training tactics, emphasizing attention-to-master training, sit/stay, and drop-it.  Likely need for crating or other physical barriers when not supervised, accepts.  Thanksgiving went acceptably.  Therapeutic modalities: Cognitive Behavioral Therapy, Solution-Oriented/Positive Psychology and Psycho-education/Bibliotherapy  Mental Status/Observations:  Appearance:   Casual     Behavior:  Appropriate  Motor:  Normal  Speech/Language:   Clear and Coherent  Affect:  Appropriate and brighter  Mood:  normal and concern appropriate to situation  Thought process:  normal  Thought content:    WNL  Sensory/Perceptual disturbances:    WNL  Orientation:  Fully oriented  Attention:  Good    Concentration:  Good  Memory:  WNL  Insight:    Good  Judgment:   Good  Impulse Control:  Good   Risk Assessment: Danger to Self: No Self-injurious Behavior: No Danger to Others: No Physical Aggression / Violence: No Duty to Warn: No Access to Firearms a concern: No  Assessment of progress:  progressing  Diagnosis:   ICD-10-CM   1. Recurrent major depressive disorder, in partial remission (HCC)  F33.41   2. Relationship problem between parent and child  Z62.820   3. Relationship problem between partners  Z63.0   4. Generalized anxiety disorder  F41.1    Plan:  . "Behavior modification" attention to loved ones -- reward them naturalistically when they are more vulnerable and fair . Consult Librarian, academic or apply known dog training skills and barrier methods to  manage aggression and property destruction.  . Other recommendations/advice as may be noted above . Continue to utilize previously learned skills ad lib . Maintain medication as prescribed and work faithfully with relevant prescriber(s) if any changes are desired or seem indicated . Call the clinic on-call service, present to ER, or call 911 if any life-threatening psychiatric crisis No  follow-ups on file. . Already scheduled visit in this office 07/14/2020.  Robley Fries, PhD Marliss Czar, PhD LP Clinical Psychologist, [facility] [address], [address] [place], [place] [address] [phone]

## 2020-07-03 ENCOUNTER — Telehealth: Payer: Self-pay | Admitting: Adult Health

## 2020-07-03 NOTE — Telephone Encounter (Signed)
Received a PA request for ondansetron 8mg . PA was started on . Key is [id number]. Per CMM, a determination will be made within the next 3 business days. Will check back early week for a determination.

## 2020-07-07 NOTE — Telephone Encounter (Signed)
Received response from [email or web address] that PA was approved 07/03/20 to 07/03/21. Will fax a copy of the determination to patient's pharmacy.

## 2020-07-14 ENCOUNTER — Ambulatory Visit (INDEPENDENT_AMBULATORY_CARE_PROVIDER_SITE_OTHER): Payer: Medicare Other | Admitting: Psychiatry

## 2020-07-14 ENCOUNTER — Other Ambulatory Visit: Payer: Self-pay

## 2020-07-14 DIAGNOSIS — F3341 Major depressive disorder, recurrent, in partial remission: Secondary | ICD-10-CM

## 2020-07-14 DIAGNOSIS — F411 Generalized anxiety disorder: Secondary | ICD-10-CM | POA: Diagnosis not present

## 2020-07-14 DIAGNOSIS — Z62820 Parent-biological child conflict: Secondary | ICD-10-CM | POA: Diagnosis not present

## 2020-07-14 DIAGNOSIS — Z630 Problems in relationship with spouse or partner: Secondary | ICD-10-CM | POA: Diagnosis not present

## 2020-07-14 DIAGNOSIS — G43909 Migraine, unspecified, not intractable, without status migrainosus: Secondary | ICD-10-CM
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
## 2020-07-25 ENCOUNTER — Other Ambulatory Visit: Payer: Self-pay | Admitting: Physician Assistant

## 2020-08-05 ENCOUNTER — Ambulatory Visit: Payer: Medicare Other | Admitting: Psychiatry

## 2020-08-06 ENCOUNTER — Ambulatory Visit: Payer: Medicare Other | Admitting: Physician Assistant

## 2020-08-08 ENCOUNTER — Other Ambulatory Visit: Payer: Self-pay | Admitting: Physician Assistant

## 2020-08-12 ENCOUNTER — Telehealth: Payer: Self-pay

## 2020-08-12 ENCOUNTER — Ambulatory Visit: Payer: Medicare Other | Admitting: Physician Assistant

## 2020-08-12 NOTE — Telephone Encounter (Signed)
Prior Authorization submitted and approved for ELETRIPTAN HYDROBROMIDE 40 MG effective 08/12/2020-08/12/2021 with [employer] Medicare ID# [id number]

## 2020-08-26 ENCOUNTER — Other Ambulatory Visit: Payer: Self-pay

## 2020-08-26 ENCOUNTER — Ambulatory Visit (INDEPENDENT_AMBULATORY_CARE_PROVIDER_SITE_OTHER): Payer: Medicare Other | Admitting: Psychiatry

## 2020-08-26 DIAGNOSIS — G43909 Migraine, unspecified, not intractable, without status migrainosus: Secondary | ICD-10-CM

## 2020-08-26 DIAGNOSIS — F3341 Major depressive disorder, recurrent, in partial remission: Secondary | ICD-10-CM | POA: Diagnosis not present

## 2020-08-26 DIAGNOSIS — F411 Generalized anxiety disorder: Secondary | ICD-10-CM | POA: Diagnosis not present

## 2020-08-26 DIAGNOSIS — Z630 Problems in relationship with spouse or partner: Secondary | ICD-10-CM | POA: Diagnosis not present

## 2020-08-26 DIAGNOSIS — Z62820 Parent-biological child conflict: Secondary | ICD-10-CM | POA: Diagnosis not present
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
## 2020-09-09 DIAGNOSIS — K5909 Other constipation: Secondary | ICD-10-CM | POA: Diagnosis not present

## 2020-09-09 DIAGNOSIS — R110 Nausea: Secondary | ICD-10-CM | POA: Diagnosis not present

## 2020-09-17 ENCOUNTER — Ambulatory Visit: Payer: Medicare Other | Admitting: Psychiatry

## 2020-09-25 ENCOUNTER — Encounter: Payer: Self-pay | Admitting: Physician Assistant

## 2020-09-25 ENCOUNTER — Other Ambulatory Visit: Payer: Self-pay

## 2020-09-25 ENCOUNTER — Ambulatory Visit (INDEPENDENT_AMBULATORY_CARE_PROVIDER_SITE_OTHER): Payer: Medicare Other | Admitting: Physician Assistant

## 2020-09-25 DIAGNOSIS — G43909 Migraine, unspecified, not intractable, without status migrainosus: Secondary | ICD-10-CM | POA: Diagnosis not present

## 2020-09-25 DIAGNOSIS — F411 Generalized anxiety disorder: Secondary | ICD-10-CM | POA: Diagnosis not present

## 2020-09-25 DIAGNOSIS — F3341 Major depressive disorder, recurrent, in partial remission: Secondary | ICD-10-CM

## 2020-09-25 MED ORDER — VENLAFAXINE HCL ER 150 MG PO CP24: ORAL_CAPSULE | ORAL | 11 refills | Status: DC

## 2020-09-25 NOTE — Progress Notes (Signed)
[facility]  Patient ID: ILISA HAYWORTH,  MRN: [id number]  PCP: Merri Brunette, MD  Date of Evaluation: 09/25/2020 Time spent:20 minutes  Chief Complaint:  Chief Complaint    Anxiety; Depression      HISTORY/CURRENT STATUS: HPI  For routine med check.  Doing well.  Psychiatric medications are working fine.  She is able to enjoy things.  Energy and motivation are good.  Not isolating.  She continues to not work due to chronic migraines and her mental health.  Migraines are still a problem but are a little better controlled.  The Relpax does help some but not completely getting rid of the headache.  She does not have any neurologic deficits associated with the migraine.  Focus and concentration as well as memory are all normal at her baseline.  No suicidal or homicidal thoughts.  She does continue to have anxiety almost daily, more of a generalized sense of unease rather than panic attacks.  If she does not take the Klonopin it can turn into that though.  She sleeps pretty well most of the time.  Denies dizziness, syncope, seizures, numbness, tingling, tremor, tics, unsteady gait, slurred speech, confusion. Denies muscle or joint pain, stiffness, or dystonia.  Individual Medical History/ Review of Systems: Changes? :Yes   GI probs and is seeing her gastroenterologist.  Was put on Trulance which is helping the chronic nausea and constipation.  Past medications for mental health diagnoses include: Effexor, Wellbutrin, Prozac caused weight loss and GI  Allergies: Wellbutrin [bupropion] and Aspirin  Current Medications:  Current Outpatient Medications:  .  aspirin-acetaminophen-caffeine (EXCEDRIN MIGRAINE) 250-250-65 MG tablet, Take 2 tablets by mouth every 6 (six) hours as needed for headache or migraine., Disp: , Rfl:  .  Calcium Carbonate-Vit D-Min (CALCIUM 1200 PO), Take 1,200 mg by mouth 2 (two) times daily. , Disp: , Rfl:  .  cholecalciferol (VITAMIN D3) 25 MCG  (1000 UNIT) tablet, Take 1,000 Units by mouth daily., Disp: , Rfl:  .  clonazePAM (KLONOPIN) 1 MG tablet, TAKE ONE TABLET BY MOUTH TWICE A DAY MAY TAKE ADDITIONAL TABLET IF NEEDED, Disp: 75 tablet, Rfl: 5 .  eletriptan (RELPAX) 40 MG tablet, TAKE 1 TABLET BY MOUTH AS NEEDED FOR MIGRAINE OR HEADACHE. MAY REPEAT IN 2 HOURS IF HEADACHE PERSISTS/RECURS, Disp: 10 tablet, Rfl: 11 .  esomeprazole (NEXIUM) 20 MG capsule, Take 20 mg by mouth daily at 12 noon., Disp: , Rfl:  .  gabapentin (NEURONTIN) 300 MG capsule, Take 3 capsules (900 mg total) by mouth at bedtime., Disp: 270 capsule, Rfl: 3 .  Magnesium 400 MG CAPS, Take 400 mg by mouth 2 (two) times daily. , Disp: , Rfl:  .  ondansetron (ZOFRAN) 8 MG tablet, TAKE ONE TABLET BY MOUTH EVERY 8 HOURS AS NEEDED FOR NAUSEA/ VOMITING, Disp: 30 tablet, Rfl: 2 .  Plecanatide (TRULANCE PO), Take 3 mg by mouth., Disp: , Rfl:  .  promethazine (PHENERGAN) 12.5 MG tablet, , Disp: , Rfl:  .  topiramate (TOPAMAX) 50 MG tablet, TAKE THREE TABLETS BY MOUTH EVERY NIGHT AT BEDTIME, Disp: 90 tablet, Rfl: 10 .  venlafaxine XR (EFFEXOR-XR) 150 MG 24 hr capsule, TAKE ONE CAPSULE BY MOUTH DAILY WITH BREAKFAST, Disp: 30 capsule, Rfl: 11 Medication Side Effects: none  Family Medical/ Social History: Changes? No  MENTAL HEALTH EXAM:  There were no vitals taken for this visit.There is no height or weight on file to calculate BMI.  General Appearance: Casual, Neat and Well Groomed  Eye  Contact:  Good  Speech:  Clear and Coherent and Normal Rate  Volume:  Normal  Mood:  Euthymic  Affect:  Appropriate  Thought Process:  Goal Directed and Descriptions of Associations: Intact  Orientation:  Full (Time, Place, and Person)  Thought Content: Logical   Suicidal Thoughts:  No  Homicidal Thoughts:  No  Memory:  WNL  Judgement:  Good  Insight:  Good  Psychomotor Activity:  Normal  Concentration:  Concentration: Good and Attention Span: Good  Recall:  Good  Fund of Knowledge:  Good  Language: Good  Assets:  Desire for Improvement  ADL's:  Intact  Cognition: WNL  Prognosis:  Good    DIAGNOSES:    ICD-10-CM   1. Recurrent major depressive disorder, in partial remission (HCC)  F33.41   2. Generalized anxiety disorder  F41.1   3. Migraine syndrome  G43.909     Receiving Psychotherapy: No Dr Marliss Czar in the past.   RECOMMENDATIONS:  PDMP was reviewed. I provided 20 minutes of face-to-face time during this encounter, in which we discussed the efficacy of the medication.  She is doing well so no changes are necessary. Continue Relpax 40 mg, 1 p.o. daily as needed.  May repeat in 2 hours if headache persists but no more than 2 pills in 24 hours. Continue gabapentin 300 mg, 3 p.o. nightly.  Given by another provider. Continue Effexor XR 150 mg, 1 p.o. every morning. Return in 6 months.  Melony Overly, PA-C

## 2020-10-01 ENCOUNTER — Other Ambulatory Visit: Payer: Self-pay

## 2020-10-01 ENCOUNTER — Ambulatory Visit (INDEPENDENT_AMBULATORY_CARE_PROVIDER_SITE_OTHER): Payer: Medicare Other | Admitting: Psychiatry

## 2020-10-01 DIAGNOSIS — F3341 Major depressive disorder, recurrent, in partial remission: Secondary | ICD-10-CM | POA: Diagnosis not present

## 2020-10-01 DIAGNOSIS — G43909 Migraine, unspecified, not intractable, without status migrainosus: Secondary | ICD-10-CM

## 2020-10-01 DIAGNOSIS — F411 Generalized anxiety disorder: Secondary | ICD-10-CM

## 2020-10-01 DIAGNOSIS — Z62820 Parent-biological child conflict: Secondary | ICD-10-CM
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
## 2020-10-22 ENCOUNTER — Ambulatory Visit (INDEPENDENT_AMBULATORY_CARE_PROVIDER_SITE_OTHER): Payer: Medicare Other | Admitting: Psychiatry

## 2020-10-22 ENCOUNTER — Other Ambulatory Visit: Payer: Self-pay

## 2020-10-22 DIAGNOSIS — Z630 Problems in relationship with spouse or partner: Secondary | ICD-10-CM

## 2020-10-22 DIAGNOSIS — Z62820 Parent-biological child conflict: Secondary | ICD-10-CM

## 2020-10-22 DIAGNOSIS — F411 Generalized anxiety disorder: Secondary | ICD-10-CM

## 2020-10-22 DIAGNOSIS — F3341 Major depressive disorder, recurrent, in partial remission: Secondary | ICD-10-CM

## 2020-10-22 DIAGNOSIS — G43909 Migraine, unspecified, not intractable, without status migrainosus: Secondary | ICD-10-CM
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
## 2020-11-03 ENCOUNTER — Other Ambulatory Visit: Payer: Self-pay | Admitting: Adult Health

## 2020-12-03 ENCOUNTER — Other Ambulatory Visit: Payer: Self-pay | Admitting: Adult Health

## 2021-01-05 ENCOUNTER — Ambulatory Visit: Payer: Medicare Other | Admitting: Adult Health

## 2021-01-21 ENCOUNTER — Other Ambulatory Visit: Payer: Self-pay | Admitting: Physician Assistant

## 2021-01-21 NOTE — Telephone Encounter (Signed)
Last filled 6/1

## 2021-02-04 DIAGNOSIS — K5909 Other constipation: Secondary | ICD-10-CM | POA: Diagnosis not present

## 2021-02-04 DIAGNOSIS — R110 Nausea: Secondary | ICD-10-CM | POA: Diagnosis not present

## 2021-03-02 ENCOUNTER — Other Ambulatory Visit: Payer: Self-pay | Admitting: Adult Health

## 2021-03-26 ENCOUNTER — Other Ambulatory Visit: Payer: Self-pay

## 2021-03-26 ENCOUNTER — Ambulatory Visit (INDEPENDENT_AMBULATORY_CARE_PROVIDER_SITE_OTHER): Payer: Medicare Other | Admitting: Physician Assistant

## 2021-03-26 ENCOUNTER — Encounter: Payer: Self-pay | Admitting: Physician Assistant

## 2021-03-26 DIAGNOSIS — F411 Generalized anxiety disorder: Secondary | ICD-10-CM

## 2021-03-26 DIAGNOSIS — G43909 Migraine, unspecified, not intractable, without status migrainosus: Secondary | ICD-10-CM | POA: Diagnosis not present

## 2021-03-26 DIAGNOSIS — F3341 Major depressive disorder, recurrent, in partial remission: Secondary | ICD-10-CM | POA: Diagnosis not present

## 2021-03-26 MED ORDER — CLONAZEPAM 1 MG PO TABS: 1.0000 mg | ORAL_TABLET | Freq: Three times a day (TID) | ORAL | 5 refills | Status: DC | PRN

## 2021-03-26 NOTE — Progress Notes (Signed)
[facility]  Patient ID: BARNEY GERTSCH,  MRN: [id number]  PCP: Merri Brunette, MD  Date of Evaluation: 03/26/2021 Time spent:30 minutes  Chief Complaint:  Chief Complaint   Anxiety; Depression; Follow-up     HISTORY/CURRENT STATUS: HPI  For routine med check.  Doing well with mental health. Able to enjoy things. Patient denies loss of interest in usual activities and is able to enjoy things.  Denies decreased energy or motivation.  Appetite has not changed.  No extreme sadness, tearfulness, or feelings of hopelessness. Anxiety is well-controlled with meds and Klonopin works well. Sleeps good. Denies suicidal or homicidal thoughts.  Patient denies increased energy with decreased need for sleep, no increased talkativeness, no racing thoughts, no impulsivity or risky behaviors, no increased spending, no increased libido, no grandiosity, no increased irritability or anger, and no hallucinations.  Review of Systems  Constitutional: Negative.   HENT: Negative.    Eyes: Negative.   Respiratory: Negative.    Cardiovascular: Negative.   Gastrointestinal: Negative.   Genitourinary: Negative.   Musculoskeletal: Negative.   Skin: Negative.   Neurological:  Positive for headaches.       Chronic migraines. No neurologic deficits.  Endo/Heme/Allergies: Negative.   Psychiatric/Behavioral:         See HPI    Individual Medical History/ Review of Systems: Changes? :Yes   had more migraines this summer partly d/t  weather.  Relpax and Excedrin Migraine helps some but do not totally get rid of the migraines.  She has had no dizziness, visual changes, numbness or tingling in any extremities, slurred speech or any other neurologic deficits.  Past medications for mental health diagnoses include: Effexor, Wellbutrin, Prozac caused weight loss and GI, Relpax, Klonopin  Allergies: Wellbutrin [bupropion] and Aspirin  Current Medications:  Current Outpatient Medications:     aspirin-acetaminophen-caffeine (EXCEDRIN MIGRAINE) 250-250-65 MG tablet, Take 2 tablets by mouth every 6 (six) hours as needed for headache or migraine., Disp: , Rfl:    Calcium Carbonate-Vit D-Min (CALCIUM 1200 PO), Take 1,200 mg by mouth 2 (two) times daily. , Disp: , Rfl:    cholecalciferol (VITAMIN D3) 25 MCG (1000 UNIT) tablet, Take 1,000 Units by mouth daily., Disp: , Rfl:    eletriptan (RELPAX) 40 MG tablet, TAKE 1 TABLET BY MOUTH AS NEEDED FOR MIGRAINE OR HEADACHE. MAY REPEAT IN 2 HOURS IF HEADACHE PERSISTS/RECURS, Disp: 10 tablet, Rfl: 11   esomeprazole (NEXIUM) 20 MG capsule, Take 20 mg by mouth daily at 12 noon., Disp: , Rfl:    gabapentin (NEURONTIN) 300 MG capsule, TAKE THREE CAPSULES BY MOUTH EVERY NIGHT AT BEDTIME, Disp: 270 capsule, Rfl: 3   Magnesium 400 MG CAPS, Take 400 mg by mouth 2 (two) times daily. , Disp: , Rfl:    ondansetron (ZOFRAN) 8 MG tablet, TAKE ONE TABLET BY MOUTH EVERY 8 HOURS AS NEEDED FOR NAUSEA/ VOMITING, Disp: 30 tablet, Rfl: 2   Plecanatide (TRULANCE PO), Take 3 mg by mouth., Disp: , Rfl:    promethazine (PHENERGAN) 12.5 MG tablet, , Disp: , Rfl:    topiramate (TOPAMAX) 50 MG tablet, TAKE THREE TABLETS BY MOUTH EVERY NIGHT AT BEDTIME, Disp: 90 tablet, Rfl: 10   venlafaxine XR (EFFEXOR-XR) 150 MG 24 hr capsule, TAKE ONE CAPSULE BY MOUTH DAILY WITH BREAKFAST, Disp: 30 capsule, Rfl: 11   clonazePAM (KLONOPIN) 1 MG tablet, Take 1 tablet (1 mg total) by mouth 3 (three) times daily as needed for anxiety. Limit to 2.5 mg/ day, Disp: 75 tablet, Rfl:  5 Medication Side Effects: none  Family Medical/ Social History: Changes? No  MENTAL HEALTH EXAM:  There were no vitals taken for this visit.There is no height or weight on file to calculate BMI.  General Appearance: Casual, Neat and Well Groomed  Eye Contact:  Good  Speech:  Clear and Coherent and Normal Rate  Volume:  Normal  Mood:  Euthymic  Affect:  Appropriate  Thought Process:  Goal Directed and Descriptions  of Associations: Circumstantial  Orientation:  Full (Time, Place, and Person)  Thought Content: Logical   Suicidal Thoughts:  No  Homicidal Thoughts:  No  Memory:  WNL  Judgement:  Good  Insight:  Good  Psychomotor Activity:  Normal  Concentration:  Concentration: Good and Attention Span: Good  Recall:  Good  Fund of Knowledge: Good  Language: Good  Assets:  Desire for Improvement  ADL's:  Intact  Cognition: WNL  Prognosis:  Good    DIAGNOSES:    ICD-10-CM   1. Recurrent major depressive disorder, in partial remission (HCC)  F33.41     2. Generalized anxiety disorder  F41.1     3. Migraine syndrome  G43.909        Receiving Psychotherapy: No     RECOMMENDATIONS:  PDMP was reviewed.  Last Klonopin 03/23/2021. I provided 30 minutes of face to face time during this encounter, including time spent before and after the visit in records review, medical decision making, and charting.  Doing well so no changes in meds. Because she is on Medicare she is unable to get Ubrelvy or Nurtec for the migraines so she will continue on a triptan. Continue Klonopin 1 mg, 1 p.o. 3 times daily as needed but limit to 2.5 mg a day. Continue Relpax 40 mg, 1 p.o. daily as needed.  May repeat in 2 hours if headache persists but no more than 2 pills in 24 hours. Continue gabapentin 300 mg, 3 p.o. nightly.  Given by another provider. Continue Effexor XR 150 mg, 1 p.o. every morning. Return in 6 months.  Melony Overly, PA-C

## 2021-04-13 ENCOUNTER — Ambulatory Visit: Payer: Medicare Other | Admitting: Psychiatry

## 2021-04-13 ENCOUNTER — Other Ambulatory Visit: Payer: Self-pay

## 2021-04-13 DIAGNOSIS — F411 Generalized anxiety disorder: Secondary | ICD-10-CM

## 2021-04-13 DIAGNOSIS — Z630 Problems in relationship with spouse or partner: Secondary | ICD-10-CM

## 2021-04-13 DIAGNOSIS — F331 Major depressive disorder, recurrent, moderate: Secondary | ICD-10-CM

## 2021-04-13 DIAGNOSIS — Z62820 Parent-biological child conflict: Secondary | ICD-10-CM | POA: Diagnosis not present

## 2021-04-13 NOTE — Progress Notes (Signed)
Psychotherapy Progress Note [facility] Marliss Czar, PhD LP  Patient ID: Amanda [name]     MRN: [id number] Therapy format: Individual psychotherapy Date: 04/13/2021      Start: 9:05a     Stop: 9:55a     Time Spent: 50 min Location: In-person   Session narrative (presenting needs, interim history, self-report of stressors and symptoms, applications of prior therapy, status changes, and interventions made in session) 6 mos since last seen.  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  Since then, she's been writing repeatedly on social media about what a bad mother Cortny is.  Eventually asked her why she feels that way and got a nonsense story about being given a doll she hated at age 39, followed with other stories that don't make sense, and sound for all the world -- at least as repeated here -- like deeply revisionist history.  Bennett Scrape, and Sarah all recall [name] as a very cheerful, compliant child.  Timing of these negative opinions began with [name]'s 1st childbirth 3 years ago, intensified with her 2nd a year ago, suggesting some form of postpartum depression.  She has offered more cogent criticisms about Jazlen "always playing the victim", which Ether initially defended then came around and apologized, profusely and in writing.  No response, but one more critical piece of writing online in the time since.  Consequently, Chrisoula has stopped looking at [employer].    [name] remains nonconfrontational, keeps pressing Amandy to "let it go", even criticized her for lack of faith, which is offensive to her.  [name]'s H turned quiet about the same time as [name] about 3 years ago.  Not clear whether he is following [name]'s lead or the other way round, but either way, not an advocate or ambassador.    Consulted at some length about staying her hand trying to defend herself, still important not to  play down to the expected role of being argumentative herself, perhaps to let [name] see the natural consequences of alienating her.  Meanwhile, knows [name] is offended for her by some gross mischaracterizations, and he is ostensibly concerned that [place] harden up an attitude toward her mother and does see some things prejudicially.  Discussions of late about him going over to their house (with Hayleen abstaining).  Encouraged she be clear with him whether she wants to be left out or informed, and represented by [name] or just let it lie until [place] comes around.  Therapeutic modalities: Cognitive Behavioral Therapy, Solution-Oriented/Positive Psychology, and Assertiveness/Communication  Mental Status/Observations:  Appearance:   Casual and Neat     Behavior:  Appropriate  Motor:  Normal  Speech/Language:   Clear and Coherent  Affect:  Appropriate  Mood:  dysthymic and irritated approp to subject  Thought process:  normal  Thought content:    WNL  Sensory/Perceptual disturbances:    WNL  Orientation:  Fully oriented  Attention:  Good    Concentration:  Good  Memory:  WNL  Insight:    Good  Judgment:   Good  Impulse Control:  Fair   Risk Assessment: Danger to Self: No Self-injurious Behavior: No Danger to Others: No Physical Aggression / Violence: No Duty to Warn: No Access to Firearms a concern: No  Assessment of progress:  stabilized  Diagnosis:   ICD-10-CM   1. Major depressive disorder, recurrent episode, moderate (HCC)  F33.1  2. Generalized anxiety disorder  F41.1     3. Relationship problem between parent and child  Z62.820     4. Relationship problem between partners  Z63.0      Plan:  Still stay off social media in case it's inflammatory Confer with husband about agreed upon messages and strategy When ready and able, OK to ask [name] what it is she feels she needs (without agreeing or disagreeing with suspicious memories) Other recommendations/advice as may be  noted above Continue to utilize previously learned skills ad lib Maintain medication as prescribed and work faithfully with relevant prescriber(s) if any changes are desired or seem indicated Call the clinic on-call service, 988/hotline, present to ER, or call 911 if any life-threatening psychiatric crisis Return for time at discretion. Already scheduled visit in this office 09/23/2021.  Robley Fries, PhD Marliss Czar, PhD LP Clinical Psychologist, [facility] [address] [place], [place] [address] [phone]

## 2021-04-22 ENCOUNTER — Other Ambulatory Visit: Payer: Self-pay | Admitting: Physician Assistant

## 2021-04-24 NOTE — Telephone Encounter (Signed)
Last filled 03/23/21

## 2021-04-25 ENCOUNTER — Other Ambulatory Visit: Payer: Self-pay | Admitting: Adult Health

## 2021-04-30 ENCOUNTER — Telehealth: Payer: Self-pay | Admitting: Adult Health

## 2021-04-30 NOTE — Telephone Encounter (Addendum)
Looks like 3 refills were sent to pharmacy back in August 2022 which should last 3 months.  I called [facility] and discussed.  Patient filled the ondansetron on 03/05/2021, 03/23/2021, 04/09/2021.  She is a savings card for at least one of the refills.  I advised the refills are supposed to be 30-day supplies.  I would have to get permission from NP to allow another early refill.  The pharmacy will make a note that prescription should last 30 days.

## 2021-04-30 NOTE — Telephone Encounter (Signed)
I called pt and she relayed that she has had an increase of her migraines to 3 a week, lasting 1-3 days triggers rain/ barometic pressure, stress (family issues).  She get nauseous from her migraines, and takes 1 every 8 hours prn (this could be 1-3 days) depending on how long her migraines last.  She has appt 05-2021 (which was moved last time.  She would really appreciate if she could get these prior to weekend.  Relpax she takes 2 daily when has migraine for them to work,  ? If this may cause some of the nausea.  Needs sooner appt then 05/2021 if cancellation.  Wants in OV.  Does not want botox although a candidate, as she is MCR now.

## 2021-04-30 NOTE — Telephone Encounter (Signed)
Pt states she has been out of the ondansetron (ZOFRAN) 8 MG tablet since Sunday, she would like to know why she is unable to get filled, she states it is not too soon.

## 2021-04-30 NOTE — Telephone Encounter (Signed)
How often is she taking it? Not meant to be taken daily

## 2021-05-01 ENCOUNTER — Encounter: Payer: Self-pay | Admitting: Adult Health

## 2021-05-01 MED ORDER — ONDANSETRON HCL 8 MG PO TABS: ORAL_TABLET | ORAL | 2 refills | Status: DC

## 2021-05-01 NOTE — Addendum Note (Signed)
Addended by: Enedina Finner on: 05/01/2021 10:49 AM   Modules accepted: Orders

## 2021-05-08 ENCOUNTER — Ambulatory Visit: Payer: Medicare Other | Admitting: Physician Assistant

## 2021-05-08 ENCOUNTER — Encounter: Payer: Self-pay | Admitting: Physician Assistant

## 2021-05-08 ENCOUNTER — Other Ambulatory Visit: Payer: Self-pay

## 2021-05-08 DIAGNOSIS — G43909 Migraine, unspecified, not intractable, without status migrainosus: Secondary | ICD-10-CM

## 2021-05-08 DIAGNOSIS — F439 Reaction to severe stress, unspecified: Secondary | ICD-10-CM | POA: Diagnosis not present

## 2021-05-08 DIAGNOSIS — F411 Generalized anxiety disorder: Secondary | ICD-10-CM | POA: Diagnosis not present

## 2021-05-08 DIAGNOSIS — F331 Major depressive disorder, recurrent, moderate: Secondary | ICD-10-CM

## 2021-05-08 MED ORDER — VENLAFAXINE HCL ER 75 MG PO CP24: 75.0000 mg | ORAL_CAPSULE | Freq: Every day | ORAL | 2 refills | Status: DC

## 2021-05-08 MED ORDER — ONDANSETRON HCL 8 MG PO TABS: 8.0000 mg | ORAL_TABLET | Freq: Three times a day (TID) | ORAL | 2 refills | Status: DC | PRN

## 2021-05-08 NOTE — Progress Notes (Signed)
[facility]  Patient ID: Amanda [name],  MRN: [id number]  PCP: Merri Brunette, MD  Date of Evaluation: 05/08/2021 Time spent:40 minutes  Chief Complaint:  Chief Complaint   Anxiety; Depression; Follow-up      HISTORY/CURRENT STATUS: HPI  For routine med check.  Husband has been dx w/ severe RA. He's in a lot of pain. Still able to work though. He's in a really bad mood. He's take everything out on her. She's very stressed b/c of all that. When he yells at her, she goes in her room and closes the door. He doesn't want to even talk to her. She's discussed all this in therapy with Dr. Farrel Demark. Has followed his advice, but it doesn't help.   All this stress is making her migraines worse, and they're bad enough already. Has needed the Zofran more often b/c of the nausea. Has been on that, Topamax, Phenergan, Relpax, and Phenergan, and Relpax for a very long time and these meds usually work well. In the past 5 years, has been seeing the PA at [facility], who pt states she only sees anually.  She asked if I can prescribe her medications for the migraines as long as they are stable.  Does feel more depressed, not really wanting to do much of anything.  Energy and motivation are fair to good depending on what is going on.  She is not isolating.  Not crying easily.  ADLs and personal hygiene are normal.  No suicidal or homicidal thoughts.  Individual Medical History/ Review of Systems: Changes? :Yes    increased migraines.  Past medications for mental health diagnoses include: Effexor, Wellbutrin, Prozac caused weight loss and GI, Relpax, Klonopin  Allergies: Wellbutrin [bupropion] and Aspirin  Current Medications:  Current Outpatient Medications:    aspirin-acetaminophen-caffeine (EXCEDRIN MIGRAINE) 250-250-65 MG tablet, Take 2 tablets by mouth every 6 (six) hours as needed for headache or migraine., Disp: , Rfl:    Calcium Carbonate-Vit D-Min (CALCIUM 1200 PO),  Take 1,200 mg by mouth 2 (two) times daily. , Disp: , Rfl:    cholecalciferol (VITAMIN D3) 25 MCG (1000 UNIT) tablet, Take 1,000 Units by mouth daily., Disp: , Rfl:    clonazePAM (KLONOPIN) 1 MG tablet, TAKE ONE TABLET BY MOUTH TWICE A DAY MAY TAKE ADDITIONAL TABLET IF NEEDED, Disp: 75 tablet, Rfl: 5   eletriptan (RELPAX) 40 MG tablet, TAKE 1 TABLET BY MOUTH AS NEEDED FOR MIGRAINE OR HEADACHE. MAY REPEAT IN 2 HOURS IF HEADACHE PERSISTS/RECURS, Disp: 10 tablet, Rfl: 11   esomeprazole (NEXIUM) 20 MG capsule, Take 20 mg by mouth daily at 12 noon., Disp: , Rfl:    gabapentin (NEURONTIN) 300 MG capsule, TAKE THREE CAPSULES BY MOUTH EVERY NIGHT AT BEDTIME, Disp: 270 capsule, Rfl: 3   Magnesium 400 MG CAPS, Take 400 mg by mouth 2 (two) times daily. , Disp: , Rfl:    Plecanatide (TRULANCE PO), Take 3 mg by mouth., Disp: , Rfl:    promethazine (PHENERGAN) 12.5 MG tablet, , Disp: , Rfl:    topiramate (TOPAMAX) 50 MG tablet, TAKE THREE TABLETS BY MOUTH EVERY NIGHT AT BEDTIME, Disp: 90 tablet, Rfl: 10   venlafaxine XR (EFFEXOR-XR) 150 MG 24 hr capsule, TAKE ONE CAPSULE BY MOUTH DAILY WITH BREAKFAST, Disp: 30 capsule, Rfl: 11   venlafaxine XR (EFFEXOR-XR) 75 MG 24 hr capsule, Take 1 capsule (75 mg total) by mouth daily with breakfast. Take w/ 150 mg pill=225 mg., Disp: 30 capsule, Rfl: 2   ondansetron (  ZOFRAN) 8 MG tablet, Take 1 tablet (8 mg total) by mouth every 8 (eight) hours as needed for nausea or vomiting., Disp: 30 tablet, Rfl: 2 Medication Side Effects: none  Family Medical/ Social History: Changes? Yes, see HPI.   MENTAL HEALTH EXAM:  There were no vitals taken for this visit.There is no height or weight on file to calculate BMI.  General Appearance: Casual, Neat and Well Groomed  Eye Contact:  Good  Speech:  Clear and Coherent and Normal Rate  Volume:  Normal  Mood:  Depressed  Affect:  Congruent  Thought Process:  Goal Directed and Descriptions of Associations: Circumstantial  Orientation:   Full (Time, Place, and Person)  Thought Content: Logical   Suicidal Thoughts:  No  Homicidal Thoughts:  No  Memory:  WNL  Judgement:  Good  Insight:  Good  Psychomotor Activity:  Normal  Concentration:  Concentration: Good and Attention Span: Good  Recall:  Good  Fund of Knowledge: Good  Language: Good  Assets:  Desire for Improvement  ADL's:  Intact  Cognition: WNL  Prognosis:  Good    DIAGNOSES:    ICD-10-CM   1. Major depressive disorder, recurrent episode, moderate (HCC)  F33.1     2. Generalized anxiety disorder  F41.1     3. Migraine syndrome  G43.909     4. Stress at home  F43.9       Receiving Psychotherapy: Yes    Dr. [name] [place]  RECOMMENDATIONS:  PDMP was reviewed.  04/23/2021 Klonopin. I provided 40  minutes of face to face time during this encounter, including time spent before and after the visit in records review, medical decision making, counseling pertinent to today's visit, and charting.  Recommend increasing the Effexor.  She would like to try it. Ok to fill all meds, including those Rx by neuro, give 30 day supply w/ RF.  Increase Effexor XR to total 225mg  day.  Continue Klonopin 1 mg, 1 p.o. 3 times daily as needed but limit to 2.5 mg a day. Continue Relpax 40 mg, 1 p.o. daily as needed.  May repeat in 2 hours if headache persists but no more than 2 pills in 24 hours. Continue Topamax 50 mg, 3 po qhs.  Continue gabapentin 300 mg, 3 p.o. nightly.   Cont Zofran 8 mg tid prn. Cont Phenergan (per GI) as needed. Continue therapy with Dr. . Return in 6 weeks.   Farrel Demark, PA-C

## 2021-05-11 NOTE — Telephone Encounter (Signed)
Pt called, would like to cancel appt due to only half of prescription was refilled.

## 2021-05-11 NOTE — Telephone Encounter (Signed)
Noted.  Received #30 from her pcp 05-08-21.

## 2021-05-22 ENCOUNTER — Ambulatory Visit: Payer: Medicare Other | Admitting: Psychiatry

## 2021-05-25 ENCOUNTER — Encounter: Payer: Self-pay | Admitting: Neurology

## 2021-05-27 ENCOUNTER — Ambulatory Visit: Payer: Medicare Other | Admitting: Adult Health

## 2021-06-12 ENCOUNTER — Ambulatory Visit: Payer: Medicare Other | Admitting: Psychiatry

## 2021-06-12 ENCOUNTER — Other Ambulatory Visit: Payer: Self-pay

## 2021-06-12 DIAGNOSIS — Z62820 Parent-biological child conflict: Secondary | ICD-10-CM | POA: Diagnosis not present

## 2021-06-12 DIAGNOSIS — Z630 Problems in relationship with spouse or partner: Secondary | ICD-10-CM | POA: Diagnosis not present

## 2021-06-12 DIAGNOSIS — F3341 Major depressive disorder, recurrent, in partial remission: Secondary | ICD-10-CM

## 2021-06-12 NOTE — Progress Notes (Signed)
Psychotherapy Progress Note [facility] Marliss Czar, PhD LP  Patient ID: EARLEAN FIDALGO [facility])    MRN: [id number] Therapy format: Individual psychotherapy Date: 06/12/2021      Start: 8:11a     Stop: 8:59a     Time Spent: 48 min Location: In-person   Session narrative (presenting needs, interim history, self-report of stressors and symptoms, applications of prior therapy, status changes, and interventions made in session) Scheduled short notice.  Stress with [name] is way down, thankfully.  Learning not to complain like she used to, paying off well, not martyring about it, just enjoying the warmth.  Stress with [name] and daughter Maralyn Sago are up, prompting the visit.  [name] is in chronic pain with RA, just increased methotrexate, which just starting to help after a grueling 9 wks.  "I finally told him off" about ways he has been a jerk to her, in defiance of their marital commitment, and he has restrained himself better.    Maralyn Sago, 3 yrs the elder child, always very compliant and easy child until [name] came home and then 30 years now in resentment of her little sister.  Enough in her behavior younger "to make me hate being a mother".  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  After graduation, told them she had to work, couldn't go on vacation with them, used parents being out of town to secretly move in with boyfriend.  Got married, divorced, lived in [place], remarried, moved back with new husband Administrator, sports, police) and a new infant, stayed with Buyer, retail and [place].  Showed ignorance how to care for the baby (to the point of near-fatal dehydration).  Maralyn Sago has always grudged about [place] not coming to [place] for the birth (at a time when both [place] and [name] had lost jobs and [name]  was getting married).  Has watched emotional neglect of the kids ever since, e.g., just sticking Lily in a containment area they routinely called "jail" while parents played on phones or were out of the room.  Moved out on their own after 18 depressing months, had 2nd child when [place] 3yo.  Kaelei missed that birth for a migraine + amnesia and a suspected stroke, then an ER episode not long after for pneumonia caught in the hospital.  Throughout her life, Caffie and [name] have offered and taken time with Tonna Corner, for love and for hope of offsetting the treatment she still gets in her own house all the time, and report is clear that Tonna Corner loves time with them, wants to come over, gladly stays over most Saturday nights, and took to church with them.  By now, habit of weekend overnight visits, Tonna Corner flourishes intellectually and emotionally in their company, and Joelyn has had the opportunity to lead her to faith, as desired, and she has a true and deep love of Bible stories.  [name], for his part, has taught her many interesting and practical things.  Observed when together that Maralyn Sago and Duane both seem to favor Meghan and crab at [place], much the way Maralyn Sago feels she got the short end as the elder child.    Recent fall festival where [place] caught flu, and on delivering her home, Maralyn Sago sarcastically said "What do you mean bringing a sick child into this house?" -- a remark  which drove Lily to tears, at which Maralyn Sago retreated to her office and [name] took care of [place].  Simisola did take opportunity, well framed, later, after cooling, to ask Maralyn Sago to remember that her kids don't understand sarcasm and her words hurt, but just got silent treatment back.  Halloween incident also where costume shoes predictably gave Lily blisters and her parents just told her while out to quit crying and suck it up, and again [name] intervened to comfort her.  More calling her down for crying when they got back to the house, and nothing but blame for Tonna Corner (and  Meghan, who cried sympathetically for her sister) being emotional.  Other incidents, enough to foresee problems when the girls get older, risk acting up, running off with bad boys, etc.  [place] says she and [name] have a pact to make the offer to take either of the girls in if they ever run away.  This week, got a text diatribe about disrespecting Maralyn Sago in her own house.    Agreed Lorian has held her tongue, watched her tone, not fed negativity.  Discussed communication tactics, centering on approaching Sarah with the same "customer service" orientation we've spoken of with [name] -- ask consent to talk about what's uncomfortable and hear observations, focus not on how bad her behavior is but what might be bothering her enough to seem to take it out on her daughter, hopefully "trap" her into a moment of emotional vulnerability, and just be the example of adult, caring listening she wants Maralyn Sago to be.  If up to it, other issues can be addressed.  Therapeutic modalities: Cognitive Behavioral Therapy, Solution-Oriented/Positive Psychology, Ego-Supportive, Faith-sensitive, and Assertiveness/Communication  Mental Status/Observations:  Appearance:   Casual and Neat     Behavior:  Appropriate  Motor:  Normal  Speech/Language:   Clear and Coherent  Affect:  Appropriate  Mood:  normal  Thought process:  normal  Thought content:    WNL  Sensory/Perceptual disturbances:    WNL  Orientation:  Fully oriented  Attention:  Good    Concentration:  Good  Memory:  WNL  Insight:    Good  Judgment:   Good  Impulse Control:  Good   Risk Assessment: Danger to Self: No Self-injurious Behavior: No Danger to Others: No Physical Aggression / Violence: No Duty to Warn: No Access to Firearms a concern: No  Assessment of progress:  progressing  Diagnosis:   ICD-10-CM   1. Recurrent major depressive disorder, in partial remission (HCC)  F33.41     2. Relationship problem between parent and child  Z62.820     3.  Relationship problem between partners  Z63.0      Plan:  "Customer service" communication approach to Maralyn Sago.  If needful, ask her if willing to listen to feedback, same as with [name]. Continue providing contrasting love and support to [facility] Other recommendations/advice as may be noted above Continue to utilize previously learned skills ad lib Maintain medication as prescribed and work faithfully with relevant prescriber(s) if any changes are desired or seem indicated Call the clinic on-call service, 988/hotline, 911, or present to [facility] or ER if any life-threatening psychiatric crisis Return for time at discretion. Already scheduled visit in this office 06/17/2021.  Robley Fries, PhD Marliss Czar, PhD LP Clinical Psychologist, [facility] [address] [place], [place] [address] [phone]

## 2021-06-17 ENCOUNTER — Ambulatory Visit: Payer: Medicare Other | Admitting: Physician Assistant

## 2021-06-30 DIAGNOSIS — M5430 Sciatica, unspecified side: Secondary | ICD-10-CM | POA: Diagnosis not present

## 2021-07-16 ENCOUNTER — Other Ambulatory Visit: Payer: Self-pay | Admitting: Family Medicine

## 2021-07-16 ENCOUNTER — Other Ambulatory Visit (HOSPITAL_COMMUNITY): Payer: Self-pay | Admitting: Family Medicine

## 2021-07-16 DIAGNOSIS — M545 Low back pain, unspecified: Secondary | ICD-10-CM | POA: Diagnosis not present

## 2021-07-16 DIAGNOSIS — G44009 Cluster headache syndrome, unspecified, not intractable: Secondary | ICD-10-CM | POA: Diagnosis not present

## 2021-07-17 ENCOUNTER — Ambulatory Visit (HOSPITAL_COMMUNITY)
Admission: RE | Admit: 2021-07-17 | Discharge: 2021-07-17 | Disposition: A | Discharge status: Home / Self Care | Payer: Medicare Other | Source: Ambulatory Visit | Attending: Family Medicine | Admitting: Family Medicine

## 2021-07-17 DIAGNOSIS — M545 Low back pain, unspecified: Secondary | ICD-10-CM

## 2021-07-17 DIAGNOSIS — M5127 Other intervertebral disc displacement, lumbosacral region: Secondary | ICD-10-CM | POA: Diagnosis not present

## 2021-07-17 MED ORDER — GADOBUTROL 1 MMOL/ML IV SOLN
7.0000 mL | Freq: Once | INTRAVENOUS | Status: AC | PRN
  Administered 2021-07-17: 21:00:00 7 mL via INTRAVENOUS

## 2021-07-21 ENCOUNTER — Other Ambulatory Visit: Payer: Self-pay | Admitting: Family Medicine

## 2021-07-21 DIAGNOSIS — M545 Low back pain, unspecified: Secondary | ICD-10-CM

## 2021-07-23 DIAGNOSIS — M5136 Other intervertebral disc degeneration, lumbar region: Secondary | ICD-10-CM | POA: Diagnosis not present

## 2021-07-28 DIAGNOSIS — M5416 Radiculopathy, lumbar region: Secondary | ICD-10-CM | POA: Diagnosis not present

## 2021-08-03 ENCOUNTER — Ambulatory Visit: Payer: Medicare Other | Admitting: Psychiatry

## 2021-08-03 ENCOUNTER — Other Ambulatory Visit: Payer: Self-pay | Admitting: Physician Assistant

## 2021-08-11 ENCOUNTER — Other Ambulatory Visit: Payer: Self-pay | Admitting: Adult Health

## 2021-08-17 ENCOUNTER — Telehealth: Payer: Self-pay | Admitting: Physician Assistant

## 2021-08-17 ENCOUNTER — Other Ambulatory Visit: Payer: Self-pay

## 2021-08-17 ENCOUNTER — Other Ambulatory Visit: Payer: Self-pay | Admitting: Adult Health

## 2021-08-17 MED ORDER — ONDANSETRON HCL 8 MG PO TABS: 8.0000 mg | ORAL_TABLET | Freq: Three times a day (TID) | ORAL | 2 refills | Status: DC | PRN

## 2021-08-17 NOTE — Telephone Encounter (Signed)
Rx sent 

## 2021-08-17 NOTE — Telephone Encounter (Signed)
Can we send in the generic  Zofran for her to [facility]. [id number] [place], [place] - [address]

## 2021-08-19 DIAGNOSIS — M5416 Radiculopathy, lumbar region: Secondary | ICD-10-CM | POA: Diagnosis not present

## 2021-08-23 ENCOUNTER — Other Ambulatory Visit: Payer: Self-pay | Admitting: Physician Assistant

## 2021-08-24 ENCOUNTER — Ambulatory Visit: Payer: Medicare Other | Admitting: Psychiatry

## 2021-08-24 ENCOUNTER — Ambulatory Visit: Payer: Medicare Other | Admitting: Physician Assistant

## 2021-08-25 NOTE — Telephone Encounter (Signed)
Ok to send

## 2021-08-27 ENCOUNTER — Ambulatory Visit (INDEPENDENT_AMBULATORY_CARE_PROVIDER_SITE_OTHER): Payer: Medicare Other | Admitting: Physician Assistant

## 2021-08-27 ENCOUNTER — Encounter: Payer: Self-pay | Admitting: Physician Assistant

## 2021-08-27 DIAGNOSIS — F3341 Major depressive disorder, recurrent, in partial remission: Secondary | ICD-10-CM | POA: Diagnosis not present

## 2021-08-27 DIAGNOSIS — G43909 Migraine, unspecified, not intractable, without status migrainosus: Secondary | ICD-10-CM | POA: Diagnosis not present

## 2021-08-27 DIAGNOSIS — Z630 Problems in relationship with spouse or partner: Secondary | ICD-10-CM

## 2021-08-27 DIAGNOSIS — F411 Generalized anxiety disorder: Secondary | ICD-10-CM | POA: Diagnosis not present

## 2021-08-27 MED ORDER — VENLAFAXINE HCL ER 75 MG PO CP24: ORAL_CAPSULE | ORAL | 1 refills | Status: DC

## 2021-08-27 NOTE — Progress Notes (Signed)
[facility]  Patient ID: Amanda [name],  MRN: [id number]  PCP: Carol Ada, MD  Date of Evaluation: 08/27/2021 Time spent:30 minutes  Chief Complaint:  Chief Complaint   Anxiety; Depression; Follow-up    Virtual Visit via Telehealth  I connected with patient by telephone, with their informed consent, and verified patient privacy and that I am speaking with the correct person using two identifiers.  I am private, in my office and the patient is at home.  I discussed the limitations, risks, security and privacy concerns of performing an evaluation and management service by telephone and the availability of in person appointments. I also discussed with the patient that there may be a patient responsible charge related to this service. The patient expressed understanding and agreed to proceed.   I discussed the assessment and treatment plan with the patient. The patient was provided an opportunity to ask questions and all were answered. The patient agreed with the plan and demonstrated an understanding of the instructions.   The patient was advised to call back or seek an in-person evaluation if the symptoms worsen or if the condition fails to improve as anticipated.  I provided 30 minutes of non-face-to-face time during this encounter.   HISTORY/CURRENT STATUS: HPI  For routine med check.  Doesn't feel like she needs the Effexor 75 mg in addition to the 150 mg. Things at home are about the same, between her and her husband, but states she has learned to accept it better.  She is able to enjoy some things, reading and Bible study.  Unfortunately she has COVID now and is feeling low energy and not wanting to do much of anything.  She also has been found to have 3 bulging disc so is in a lot of pain.  She has started PT and hopes that will take care of the problem and she will not need surgery.  Continues to have chronic migraines.  Her rescue medicines do help some.  She  is isolating now for COVID but also due to the back pain.  Personal hygiene is normal.  ADLs are somewhat limited because of the pain.  Sleeps fairly well, depends on how much pain she is in.  She is on oxycodone which is helpful.  No suicidal or homicidal thoughts.  Anxiety is still a factor but Klonopin does help with.  She is not having panic attacks so much as just a generalized sense of unease, like something bad is going to happen at any time if she does not take the Klonopin.  Patient denies increased energy with decreased need for sleep, no increased talkativeness, no racing thoughts, no impulsivity or risky behaviors, no increased spending, no increased libido, no grandiosity, no increased irritability or anger, and no hallucinations.  Denies dizziness, syncope, seizures, numbness, tingling, tremor, tics, unsteady gait, slurred speech, confusion. Denies dystonia.  Individual Medical History/ Review of Systems: Changes? :Yes     3 bulging discs in Lspine is in physical therapy now Has covid right now.   Past medications for mental health diagnoses include: Effexor, Wellbutrin, Prozac caused weight loss and GI, Relpax, Klonopin  Allergies: Wellbutrin [bupropion] and Aspirin  Current Medications:  Current Outpatient Medications:    aspirin-acetaminophen-caffeine (EXCEDRIN MIGRAINE) 250-250-65 MG tablet, Take 2 tablets by mouth every 6 (six) hours as needed for headache or migraine., Disp: , Rfl:    Calcium Carbonate-Vit D-Min (CALCIUM 1200 PO), Take 1,200 mg by mouth 2 (two) times daily. , Disp: , Rfl:  cholecalciferol (VITAMIN D3) 25 MCG (1000 UNIT) tablet, Take 1,000 Units by mouth daily., Disp: , Rfl:    clonazePAM (KLONOPIN) 1 MG tablet, TAKE ONE TABLET BY MOUTH TWICE A DAY MAY TAKE ADDITIONAL TABLET IF NEEDED, Disp: 75 tablet, Rfl: 5   eletriptan (RELPAX) 40 MG tablet, TAKE 1 TABLET BY MOUTH AS NEEDED FOR MIGRAINE OR HEADACHE.  MAY REPEAT IN 2 HOURS IF HEADACHE PERSISTS OR  RECURS., Disp: 10 tablet, Rfl: 11   esomeprazole (NEXIUM) 20 MG capsule, Take 20 mg by mouth daily at 12 noon., Disp: , Rfl:    gabapentin (NEURONTIN) 300 MG capsule, TAKE THREE CAPSULES BY MOUTH EVERY NIGHT AT BEDTIME, Disp: 270 capsule, Rfl: 3   Magnesium 400 MG CAPS, Take 400 mg by mouth 2 (two) times daily. , Disp: , Rfl:    ondansetron (ZOFRAN) 8 MG tablet, Take 1 tablet (8 mg total) by mouth every 8 (eight) hours as needed for nausea or vomiting., Disp: 30 tablet, Rfl: 2   oxyCODONE-acetaminophen (PERCOCET) 10-325 MG tablet, Take 1 tablet by mouth 3 (three) times daily as needed., Disp: , Rfl:    Plecanatide (TRULANCE PO), Take 3 mg by mouth., Disp: , Rfl:    promethazine (PHENERGAN) 12.5 MG tablet, , Disp: , Rfl:    topiramate (TOPAMAX) 50 MG tablet, TAKE THREE TABLETS BY MOUTH EVERY NIGHT AT BEDTIME (Patient taking differently: Take 100 mg by mouth daily.), Disp: 90 tablet, Rfl: 10   venlafaxine XR (EFFEXOR-XR) 150 MG 24 hr capsule, TAKE ONE CAPSULE BY MOUTH DAILY WITH BREAKFAST, Disp: 30 capsule, Rfl: 11   venlafaxine XR (EFFEXOR-XR) 75 MG 24 hr capsule, TAKE ONE CAPSULE BY MOUTH EVERY MORNING WITH BREAKFAST AND THE 150MG  CAPSULE, Disp: 30 capsule, Rfl: 1 Medication Side Effects: none  Family Medical/ Social History: Changes? Yes, see HPI.   MENTAL HEALTH EXAM:  There were no vitals taken for this visit.There is no height or weight on file to calculate BMI.  General Appearance:  Unable to assess  Eye Contact:   Unable to assess  Speech:  Clear and Coherent and Normal Rate  Volume:  Normal  Mood:  Euthymic  Affect:   Unable to assess  Thought Process:  Goal Directed and Descriptions of Associations: Circumstantial  Orientation:  Full (Time, Place, and Person)  Thought Content: Logical   Suicidal Thoughts:  No  Homicidal Thoughts:  No  Memory:  WNL  Judgement:  Good  Insight:  Good  Psychomotor Activity:   Unable to assess  Concentration:  Concentration: Good and Attention  Span: Good  Recall:  Good  Fund of Knowledge: Good  Language: Good  Assets:  Desire for Improvement  ADL's:  Intact  Cognition: WNL  Prognosis:  Good    DIAGNOSES:    ICD-10-CM   1. Recurrent major depressive disorder, in partial remission ([place])  F33.41     2. Generalized anxiety disorder  F41.1     3. Migraine syndrome  G43.909     4. Relationship problem between partners  Z63.0        Receiving Psychotherapy: Yes    Dr. Jonni Sanger Mitchum  RECOMMENDATIONS:  PDMP was reviewed.  Klonopin filled 08/10/2021, also on oxycodone.  I provided 30 minutes of non-face-to-face time during this encounter, including time spent before and after the visit in records review, medical decision making, counseling pertinent to today's visit, and charting.  I am sorry to hear about her new health problems of bulging disks.  I hope physical therapy will  help and she will not need further treatment. The SNRI class of antidepressants can be beneficial for pain in some patients.  Cymbalta particularly so but sometimes Effexor can be.  At this time I recommend keeping the dose at 225 mg, until we see how things go with the physical therapy and whether surgery or another treatment will be needed.  She understands and agrees.  We can revisit this at a future appointment. Continue Effexor XR total 225 mg daily.  Continue Klonopin 1 mg, 1 p.o. 3 times daily as needed but limit to 2.5 mg a day. Continue Relpax 40 mg, 1 p.o. daily as needed.  May repeat in 2 hours if headache persists but no more than 2 pills in 24 hours. Continue Topamax 50 mg, 2 po qhs.  Continue gabapentin 300 mg, 3 p.o. nightly.   Cont Zofran 8 mg tid prn. Cont Phenergan (per GI) as needed. Continue therapy with Dr. Rica Mote. Return in 4 weeks.   Donnal Moat, PA-C

## 2021-08-28 DIAGNOSIS — M5416 Radiculopathy, lumbar region: Secondary | ICD-10-CM | POA: Diagnosis not present

## 2021-08-29 ENCOUNTER — Other Ambulatory Visit: Payer: Self-pay | Admitting: Adult Health

## 2021-09-23 ENCOUNTER — Ambulatory Visit: Payer: Medicare Other | Admitting: Physician Assistant

## 2021-09-23 ENCOUNTER — Other Ambulatory Visit: Payer: Self-pay

## 2021-09-23 ENCOUNTER — Encounter: Payer: Self-pay | Admitting: Physician Assistant

## 2021-09-23 DIAGNOSIS — F3341 Major depressive disorder, recurrent, in partial remission: Secondary | ICD-10-CM | POA: Diagnosis not present

## 2021-09-23 DIAGNOSIS — F411 Generalized anxiety disorder: Secondary | ICD-10-CM | POA: Diagnosis not present

## 2021-09-23 DIAGNOSIS — G43909 Migraine, unspecified, not intractable, without status migrainosus: Secondary | ICD-10-CM

## 2021-09-23 MED ORDER — CLONAZEPAM 1 MG PO TABS: ORAL_TABLET | ORAL | 5 refills | Status: DC

## 2021-09-23 MED ORDER — VENLAFAXINE HCL ER 150 MG PO CP24: ORAL_CAPSULE | ORAL | 11 refills | Status: DC

## 2021-09-23 MED ORDER — VENLAFAXINE HCL ER 75 MG PO CP24: ORAL_CAPSULE | ORAL | 11 refills | Status: DC

## 2021-09-23 NOTE — Progress Notes (Signed)
[facility] ? ?Patient ID: Amanda [name],  ?MRN: [id number] ? ?PCP: Merri Brunette, MD ? ?Date of Evaluation: 09/23/2021 ?Time spent:20 minutes ? ?Chief Complaint:  ?Chief Complaint   ?Anxiety; Depression; Follow-up ?  ? ? ? ?HISTORY/CURRENT STATUS: ?HPI  For routine med check. ? ?She went off the Topamax.  States she was tired of "being stupid."  Has not noticed any real difference as far as migraines go.  She does still have to take the Relpax frequently, at least several times a week and always has to take 2 in order to abort the migraine.  Also takes Zofran for the nausea.  No neurologic deficits are reported when she has the migraine. ? ?As far as her psych medicines go she is doing well.  When she does not have a migraine, she is able to enjoy things.  Energy and motivation are fair to good depending on the situation.  ADLs and personal hygiene are normal.  Not isolating.  Appetite is normal and weight is stable.  No suicidal or homicidal thoughts. ? ?She does have anxiety still but Klonopin helps.  If she does not take it daily she has an overwhelming sense of unease, like something bad is going to happen.  No panic attacks though. ? ?Patient denies increased energy with decreased need for sleep, no increased talkativeness, no racing thoughts, no impulsivity or risky behaviors, no increased spending, no increased libido, no grandiosity, no increased irritability or anger, and no hallucinations. ? ?Denies dizziness, syncope, seizures, numbness, tingling, tremor, tics, unsteady gait, slurred speech, confusion. Denies dystonia. ? ?Individual Medical History/ Review of Systems: Changes? :No     ? ?Past medications for mental health diagnoses include: ?Effexor, Wellbutrin, Prozac caused weight loss and GI, Relpax, Klonopin ? ?Allergies: Wellbutrin [bupropion] and Aspirin ? ?Current Medications:  ?Current Outpatient Medications:  ?  aspirin-acetaminophen-caffeine (EXCEDRIN MIGRAINE) 250-250-65 MG  tablet, Take 2 tablets by mouth every 6 (six) hours as needed for headache or migraine., Disp: , Rfl:  ?  Calcium Carbonate-Vit D-Min (CALCIUM 1200 PO), Take 1,200 mg by mouth 2 (two) times daily. , Disp: , Rfl:  ?  cholecalciferol (VITAMIN D3) 25 MCG (1000 UNIT) tablet, Take 1,000 Units by mouth daily., Disp: , Rfl:  ?  eletriptan (RELPAX) 40 MG tablet, TAKE 1 TABLET BY MOUTH AS NEEDED FOR MIGRAINE OR HEADACHE.  MAY REPEAT IN 2 HOURS IF HEADACHE PERSISTS OR RECURS., Disp: 10 tablet, Rfl: 11 ?  esomeprazole (NEXIUM) 20 MG capsule, Take 20 mg by mouth daily at 12 noon., Disp: , Rfl:  ?  gabapentin (NEURONTIN) 300 MG capsule, TAKE THREE CAPSULES BY MOUTH EVERY NIGHT AT BEDTIME, Disp: 270 capsule, Rfl: 3 ?  Magnesium 400 MG CAPS, Take 400 mg by mouth 2 (two) times daily. , Disp: , Rfl:  ?  ondansetron (ZOFRAN) 8 MG tablet, Take 1 tablet (8 mg total) by mouth every 8 (eight) hours as needed for nausea or vomiting., Disp: 30 tablet, Rfl: 2 ?  oxyCODONE-acetaminophen (PERCOCET) 10-325 MG tablet, Take 1 tablet by mouth 3 (three) times daily as needed., Disp: , Rfl:  ?  Plecanatide (TRULANCE PO), Take 3 mg by mouth., Disp: , Rfl:  ?  promethazine (PHENERGAN) 12.5 MG tablet, , Disp: , Rfl:  ?  clonazePAM (KLONOPIN) 1 MG tablet, TAKE ONE TABLET BY MOUTH TWICE A DAY MAY TAKE ADDITIONAL TABLET IF NEEDED, Disp: 75 tablet, Rfl: 5 ?  venlafaxine XR (EFFEXOR-XR) 150 MG 24 hr capsule, TAKE ONE CAPSULE BY  MOUTH DAILY WITH BREAKFAST, Take with the 75 mg = 225 mg daily., Disp: 30 capsule, Rfl: 11 ?  venlafaxine XR (EFFEXOR-XR) 75 MG 24 hr capsule, TAKE ONE CAPSULE BY MOUTH EVERY MORNING WITH BREAKFAST AND THE 150MG  CAPSULE, Disp: 30 capsule, Rfl: 11 ?Medication Side Effects: none ? ?Family Medical/ Social History: Changes? Yes, see HPI.  ? ?MENTAL HEALTH EXAM: ? ?There were no vitals taken for this visit.There is no height or weight on file to calculate BMI.  ?General Appearance: Casual and Well Groomed  ?Eye Contact:  Good  ?Speech:   Clear and Coherent and Normal Rate  ?Volume:  Normal  ?Mood:  Euthymic  ?Affect:  Congruent  ?Thought Process:  Goal Directed and Descriptions of Associations: Circumstantial  ?Orientation:  Full (Time, Place, and Person)  ?Thought Content: Logical   ?Suicidal Thoughts:  No  ?Homicidal Thoughts:  No  ?Memory:  WNL  ?Judgement:  Good  ?Insight:  Good  ?Psychomotor Activity:  Normal  ?Concentration:  Concentration: Good and Attention Span: Good  ?Recall:  Good  ?Fund of Knowledge: Good  ?Language: Good  ?Assets:  Desire for Improvement  ?ADL's:  Intact  ?Cognition: WNL  ?Prognosis:  Good  ? ? ?DIAGNOSES:  ?  ICD-10-CM   ?1. Recurrent major depressive disorder, in partial remission (HCC)  F33.41   ?  ?2. Generalized anxiety disorder  F41.1   ?  ?3. Migraine syndrome  G43.909   ?  ? ? ? ? ?Receiving Psychotherapy: Yes    Dr. Mitchum ? ?RECOMMENDATIONS:  ?PDMP was reviewed.  Klonopin filled 09/09/2021.  Oxycodone known to me. ?I provided 20 minutes of face to face time during this encounter, including time spent before and after the visit in records review, medical decision making, counseling pertinent to today's visit, and charting.  ?Doing well so no changes are made.  ?We discussed the Zofran and Relpax, I don't mind prescribing it but would like for her discuss it with her PCP, hopefully they can take over.  If not I will continue to prescribe, I am not as comfortable prescribing the Zofran, not sure how much is too much.  Patient understands. ? ?Continue Effexor XR total 225 mg daily.  ?Continue Klonopin 1 mg, 1 p.o. 3 times daily as needed but limit to 2.5 mg a day. ?Continue Relpax 40 mg, 1 p.o. daily as needed.  May repeat in 2 hours if headache persists but no more than 2 pills in 24 hours. ?Continue gabapentin 300 mg, 3 p.o. nightly.   ?Cont Zofran 8 mg tid prn. ?Cont Phenergan (per GI) as needed. ?Continue therapy with Dr. 09/11/2021. ?Return in 6 months. ? ?Farrel Demark, PA-C  ?

## 2021-09-29 ENCOUNTER — Telehealth: Payer: Self-pay

## 2021-09-29 NOTE — Telephone Encounter (Signed)
Thanks Traci.

## 2021-09-29 NOTE — Telephone Encounter (Signed)
Prior Authorization submitted and approved for ELETRIPTAN HYDROBOMIDE 40 MG #10 effective 09/28/2021-09/28/2022. ? ? ?Lynnda Shields... looking back on history I was able to add previous medications tried that were not listed in her office note in case needed for reference.  ? ?Topamax ?Imitrex ?Maxalt ?Nurtec ?Gabapentin ?Frova ?Amovig  ?

## 2021-10-02 DIAGNOSIS — K5900 Constipation, unspecified: Secondary | ICD-10-CM | POA: Diagnosis not present

## 2021-10-02 DIAGNOSIS — R110 Nausea: Secondary | ICD-10-CM | POA: Diagnosis not present

## 2021-10-02 DIAGNOSIS — Z136 Encounter for screening for cardiovascular disorders: Secondary | ICD-10-CM | POA: Diagnosis not present

## 2021-10-02 DIAGNOSIS — Z0000 Encounter for general adult medical examination without abnormal findings: Secondary | ICD-10-CM | POA: Diagnosis not present

## 2021-10-02 DIAGNOSIS — F3341 Major depressive disorder, recurrent, in partial remission: Secondary | ICD-10-CM | POA: Diagnosis not present

## 2021-10-27 ENCOUNTER — Other Ambulatory Visit: Payer: Self-pay | Admitting: Physician Assistant

## 2021-11-19 DIAGNOSIS — J029 Acute pharyngitis, unspecified: Secondary | ICD-10-CM | POA: Diagnosis not present

## 2021-11-19 DIAGNOSIS — G43919 Migraine, unspecified, intractable, without status migrainosus: Secondary | ICD-10-CM | POA: Diagnosis not present

## 2021-11-19 DIAGNOSIS — J020 Streptococcal pharyngitis: Secondary | ICD-10-CM | POA: Diagnosis not present

## 2021-11-21 ENCOUNTER — Other Ambulatory Visit: Payer: Self-pay | Admitting: Adult Health

## 2021-11-23 ENCOUNTER — Other Ambulatory Visit: Payer: Self-pay | Admitting: Physician Assistant

## 2021-11-23 MED ORDER — GABAPENTIN 300 MG PO CAPS: ORAL_CAPSULE | ORAL | 1 refills | Status: DC

## 2021-11-23 NOTE — Telephone Encounter (Signed)
Pt called again at 1:45p.  She says she gets terrible hallucinations if she misses one dose of meds.  She is out of meds completely.  She knows you haven't filled it before, but she says her PCP says it has to be filled by the neurologist.  She said can't get new pt neurologist appt until August. ?

## 2021-11-23 NOTE — Telephone Encounter (Signed)
Pt lvm yesterday that she needs a refill on her gabapentin 300 mg she takes 3 aat bedtime. She used to get this script from [facility] and she is no longer going there. She would like teresa to send in script until she can see her new neuro dr. In august.Her pharmacy is the [facility] on [address]. Please call her back if teresa is not able to fill it. Her number is [phone] ? ?

## 2021-12-22 DIAGNOSIS — J069 Acute upper respiratory infection, unspecified: Secondary | ICD-10-CM | POA: Diagnosis not present

## 2021-12-22 DIAGNOSIS — G43919 Migraine, unspecified, intractable, without status migrainosus: Secondary | ICD-10-CM | POA: Diagnosis not present

## 2021-12-22 DIAGNOSIS — J029 Acute pharyngitis, unspecified: Secondary | ICD-10-CM | POA: Diagnosis not present

## 2021-12-24 DIAGNOSIS — J22 Unspecified acute lower respiratory infection: Secondary | ICD-10-CM | POA: Diagnosis not present

## 2021-12-24 DIAGNOSIS — H6693 Otitis media, unspecified, bilateral: Secondary | ICD-10-CM | POA: Diagnosis not present

## 2022-01-06 DIAGNOSIS — H43812 Vitreous degeneration, left eye: Secondary | ICD-10-CM | POA: Diagnosis not present

## 2022-01-14 ENCOUNTER — Other Ambulatory Visit: Payer: Self-pay | Admitting: Physician Assistant

## 2022-01-27 DIAGNOSIS — L03011 Cellulitis of right finger: Secondary | ICD-10-CM | POA: Diagnosis not present

## 2022-03-03 DIAGNOSIS — H43812 Vitreous degeneration, left eye: Secondary | ICD-10-CM | POA: Diagnosis not present

## 2022-03-10 DIAGNOSIS — K5909 Other constipation: Secondary | ICD-10-CM | POA: Diagnosis not present

## 2022-03-10 DIAGNOSIS — G43711 Chronic migraine without aura, intractable, with status migrainosus: Secondary | ICD-10-CM | POA: Diagnosis not present

## 2022-03-26 ENCOUNTER — Ambulatory Visit: Payer: Medicare Other | Admitting: Physician Assistant

## 2022-03-30 ENCOUNTER — Other Ambulatory Visit: Payer: Self-pay | Admitting: Physician Assistant

## 2022-04-12 ENCOUNTER — Other Ambulatory Visit: Payer: Self-pay | Admitting: Physician Assistant

## 2022-04-19 ENCOUNTER — Ambulatory Visit: Payer: Medicare Other | Admitting: Physician Assistant

## 2022-04-19 ENCOUNTER — Encounter: Payer: Self-pay | Admitting: Physician Assistant

## 2022-04-19 DIAGNOSIS — G2581 Restless legs syndrome: Secondary | ICD-10-CM

## 2022-04-19 DIAGNOSIS — F411 Generalized anxiety disorder: Secondary | ICD-10-CM

## 2022-04-19 DIAGNOSIS — F3341 Major depressive disorder, recurrent, in partial remission: Secondary | ICD-10-CM | POA: Diagnosis not present

## 2022-04-19 DIAGNOSIS — G43909 Migraine, unspecified, not intractable, without status migrainosus: Secondary | ICD-10-CM

## 2022-04-19 DIAGNOSIS — F413 Other mixed anxiety disorders: Secondary | ICD-10-CM | POA: Diagnosis not present

## 2022-04-19 DIAGNOSIS — K5909 Other constipation: Secondary | ICD-10-CM

## 2022-04-19 DIAGNOSIS — R413 Other amnesia: Secondary | ICD-10-CM | POA: Diagnosis not present

## 2022-04-19 DIAGNOSIS — R110 Nausea: Secondary | ICD-10-CM

## 2022-04-19 DIAGNOSIS — F439 Reaction to severe stress, unspecified: Secondary | ICD-10-CM

## 2022-04-19 NOTE — Progress Notes (Signed)
[facility] Check  Patient ID: Amanda [name],  MRN: [id number]  PCP: Merri Brunette, MD  Date of Evaluation: 04/19/2022 Time spent:30 minutes  Chief Complaint:  Chief Complaint   Anxiety; Depression; Follow-up    HISTORY/CURRENT STATUS: HPI  For routine med check.  Hasn't been feeling well. GI sx. Chronic constipation, GERD, nausea daily. Takes zofran and phenergan prn, which helps, but Zofran gives her a headache. Using prune juice for constipation but that makes her nauseated.  Neurologist recommended that she see GI but she does not feel that it will be helpful so she has chosen not to go.  Migraines are much worse. Saw a new neuro in August. No med changes were made. Staying in bed a lot b/c migraines. Feels that memory is worse, we d/c Topamax months ago, hoping that would help.  It has not.   She does not do much of anything for fun.  She is active in her church, and does get to see her grandkids sometimes.  There is strife in the family though.  Not new but gets worse sometimes than others.  Energy and motivation are fair to good depending on the day.  Appetite depends on the day but usually it is decreased.  Has not lost any weight.  ADLs and personal hygiene are normal.  Not isolating.  Not crying easily.  States that even with everything going on she is not discouraged.  Still unable to work due to physical and mental health.  No suicidal or homicidal thoughts.  Sleeps a lot, no trouble falling asleep or staying asleep.  Anxiety is controlled with the Klonopin.  She has to take 2 pills every night or else the RLS is really bad.  She also takes gabapentin for that.  When she takes the Phenergan or Zofran to help with nausea from a migraine the RLS is worse.  Patient denies increased energy with decreased need for sleep, increased talkativeness, racing thoughts, impulsivity or risky behaviors, increased spending, increased libido, grandiosity, increased irritability or  anger, paranoia, or hallucinations.  Denies dizziness, syncope, seizures, numbness, tingling, tremor, tics, unsteady gait, slurred speech, confusion.  Handwriting is worse, but difficult to say when it started.  She hardly ever writes anymore anyway.  Denies dystonia.  Individual Medical History/ Review of Systems: Changes? :Yes    see HPI  Past medications for mental health diagnoses include: Effexor, Wellbutrin, Prozac caused weight loss and GI, Relpax, Klonopin  Allergies: Wellbutrin [bupropion] and Aspirin  Current Medications:  Current Outpatient Medications:    aspirin-acetaminophen-caffeine (EXCEDRIN MIGRAINE) 250-250-65 MG tablet, Take 2 tablets by mouth every 6 (six) hours as needed for headache or migraine., Disp: , Rfl:    Calcium Carbonate-Vit D-Min (CALCIUM 1200 PO), Take 1,200 mg by mouth 2 (two) times daily. , Disp: , Rfl:    cholecalciferol (VITAMIN D3) 25 MCG (1000 UNIT) tablet, Take 1,000 Units by mouth daily., Disp: , Rfl:    clonazePAM (KLONOPIN) 1 MG tablet, TAKE ONE TABLET BY MOUTH TWICE A DAY MAY TAKE ADDITIONAL TABLET IF NEEDED, Disp: 75 tablet, Rfl: 5   eletriptan (RELPAX) 40 MG tablet, TAKE 1 TABLET BY MOUTH AS NEEDED FOR MIGRAINE OR HEADACHE.  MAY REPEAT IN 2 HOURS IF HEADACHE PERSISTS OR RECURS., Disp: 10 tablet, Rfl: 11   esomeprazole (NEXIUM) 20 MG capsule, Take 20 mg by mouth daily at 12 noon., Disp: , Rfl:    gabapentin (NEURONTIN) 300 MG capsule, TAKE THREE CAPSULES BY MOUTH EVERY NIGHT AT BEDTIME, Disp:  270 capsule, Rfl: 1   Magnesium 400 MG CAPS, Take 400 mg by mouth 2 (two) times daily. , Disp: , Rfl:    ondansetron (ZOFRAN) 8 MG tablet, TAKE 1 TABLET BY MOUTH EVERY 8 HOURS AS NEEDED FOR NAUSEA AND VOMITING, Disp: 30 tablet, Rfl: 2   promethazine (PHENERGAN) 12.5 MG tablet, , Disp: , Rfl:    venlafaxine XR (EFFEXOR-XR) 150 MG 24 hr capsule, TAKE ONE CAPSULE BY MOUTH DAILY WITH BREAKFAST, Take with the 75 mg = 225 mg daily., Disp: 30 capsule, Rfl: 11    venlafaxine XR (EFFEXOR-XR) 75 MG 24 hr capsule, TAKE ONE CAPSULE BY MOUTH EVERY MORNING WITH BREAKFAST AND THE 150MG  CAPSULE, Disp: 30 capsule, Rfl: 11   oxyCODONE-acetaminophen (PERCOCET) 10-325 MG tablet, Take 1 tablet by mouth 3 (three) times daily as needed. (Patient not taking: Reported on 04/19/2022), Disp: , Rfl:    Plecanatide (TRULANCE PO), Take 3 mg by mouth. (Patient not taking: Reported on 04/19/2022), Disp: , Rfl:  Medication Side Effects: none  Family Medical/ Social History: Changes?  Her husband lost his job in the late spring.  Started a new one a few months ago.  MENTAL HEALTH EXAM:  There were no vitals taken for this visit.There is no height or weight on file to calculate BMI.  General Appearance: Casual and Well Groomed  Eye Contact:  Good  Speech:  Clear and Coherent and Normal Rate  Volume:  Normal  Mood:   Sad  Affect:  Congruent  Thought Process:  Goal Directed and Descriptions of Associations: Circumstantial  Orientation:  Full (Time, Place, and Person)  Thought Content: Logical   Suicidal Thoughts:  No  Homicidal Thoughts:  No  Memory:  Immediate;   Fair Recent;   Fair Remote;   Good  Judgement:  Good  Insight:  Good  Psychomotor Activity:  Normal  Concentration:  Concentration: Good and Attention Span: Fair  Recall:  Good  Fund of Knowledge: Good  Language: Good  Assets:  Desire for Improvement Financial Resources/Insurance Housing Transportation  ADL's:  Intact  Cognition: WNL  Prognosis:  Good   DIAGNOSES:    ICD-10-CM   1. Recurrent major depressive disorder, in partial remission (HCC)  F33.41     2. Generalized anxiety disorder  F41.1     3. Migraine syndrome  G43.909     4. Memory deficit  R41.3     5. Restless leg syndrome  G25.81     6. Chronic nausea  R11.0     7. Chronic constipation  K59.09     8. Stress at home  F43.9       Receiving Psychotherapy: No    Dr. 02-14-1991 Mitchum in the past  RECOMMENDATIONS:  PDMP was  reviewed.  Klonopin filled 04/13/2022. I provided 30 minutes of face to face time during this encounter, including time spent before and after the visit in records review, medical decision making, counseling pertinent to today's visit, and charting.   As far as the depression and anxiety go she is stable.  Neither of 04/15/2022 feel like medication changes are needed at this time.  I did bring up the fact that Ativan can sometimes help with nausea, we could change Klonopin to that.  She wants to ask the PCP or neurologist first.  She can let me know at any point if she wants to make that change.  I agree with the neurologist that she needs to see GI for the chronic GI symptoms she is  having.  It seems that she is in a loop of migraine, nausea and vomiting associated with the migraine.  Zofran and Phenergan help the nausea and vomiting but worsen the RLS and Zofran gives her a headache.  I have asked her to see her neurologist again sometime soon so she can discuss the migraines, memory deficits, RLS, and possible tremor of the right hand causing bad hand writing.  I do not see a tremor today.  May need to see her PCP for routine labs as well.  Continue Klonopin 1 mg, 1 p.o. 3 times daily as needed but limit to 2.5 mg a day. Continue Relpax 40 mg, 1 p.o. daily as needed migraine, may repeat in 2 hours as needed.  Neurology now prescribes. Continue gabapentin 300 mg, 3 p.o. nightly. Continue Zofran 8 mg, 1 p.o. every 8 hours as needed nausea and vomiting. Continue Phenergan 12.5 mg, 1 p.o. every 8 hours as needed nausea and vomiting. Continue Effexor XR 150 mg, +75 mg to equal 225 mg daily. Return in 6 months.  Donnal Moat, PA-C

## 2022-05-18 ENCOUNTER — Other Ambulatory Visit: Payer: Self-pay | Admitting: Physician Assistant

## 2022-05-25 ENCOUNTER — Other Ambulatory Visit: Payer: Self-pay | Admitting: Physician Assistant

## 2022-06-01 DIAGNOSIS — K5904 Chronic idiopathic constipation: Secondary | ICD-10-CM | POA: Diagnosis not present

## 2022-06-01 DIAGNOSIS — R110 Nausea: Secondary | ICD-10-CM | POA: Diagnosis not present

## 2022-08-04 ENCOUNTER — Other Ambulatory Visit: Payer: Self-pay | Admitting: Physician Assistant

## 2022-08-25 DIAGNOSIS — M5416 Radiculopathy, lumbar region: Secondary | ICD-10-CM | POA: Diagnosis not present

## 2022-08-25 DIAGNOSIS — M47816 Spondylosis without myelopathy or radiculopathy, lumbar region: Secondary | ICD-10-CM | POA: Diagnosis not present

## 2022-08-25 DIAGNOSIS — M545 Low back pain, unspecified: Secondary | ICD-10-CM | POA: Diagnosis not present

## 2022-08-26 DIAGNOSIS — M545 Low back pain, unspecified: Secondary | ICD-10-CM | POA: Diagnosis present

## 2022-08-27 DIAGNOSIS — M5416 Radiculopathy, lumbar region: Secondary | ICD-10-CM | POA: Diagnosis not present

## 2022-09-07 DIAGNOSIS — M5126 Other intervertebral disc displacement, lumbar region: Secondary | ICD-10-CM | POA: Diagnosis not present

## 2022-09-07 DIAGNOSIS — Z6828 Body mass index (BMI) 28.0-28.9, adult: Secondary | ICD-10-CM | POA: Diagnosis not present

## 2022-09-14 DIAGNOSIS — M5126 Other intervertebral disc displacement, lumbar region: Secondary | ICD-10-CM | POA: Diagnosis not present

## 2022-09-20 ENCOUNTER — Other Ambulatory Visit: Payer: Self-pay | Admitting: Physician Assistant

## 2022-09-26 ENCOUNTER — Emergency Department (HOSPITAL_COMMUNITY): Payer: Medicare Other

## 2022-09-26 ENCOUNTER — Encounter (HOSPITAL_COMMUNITY): Payer: Self-pay

## 2022-09-26 ENCOUNTER — Inpatient Hospital Stay (HOSPITAL_COMMUNITY)
Admission: EM | Admit: 2022-09-26 | Discharge: 2022-10-09 | DRG: 856 | Disposition: A | Discharge status: Home Health | Payer: Medicare Other | Attending: Internal Medicine | Admitting: Internal Medicine

## 2022-09-26 ENCOUNTER — Other Ambulatory Visit: Payer: Self-pay

## 2022-09-26 DIAGNOSIS — Z79899 Other long term (current) drug therapy: Secondary | ICD-10-CM | POA: Diagnosis not present

## 2022-09-26 DIAGNOSIS — E872 Acidosis, unspecified: Secondary | ICD-10-CM | POA: Diagnosis not present

## 2022-09-26 DIAGNOSIS — R519 Headache, unspecified: Secondary | ICD-10-CM | POA: Diagnosis not present

## 2022-09-26 DIAGNOSIS — R748 Abnormal levels of other serum enzymes: Secondary | ICD-10-CM | POA: Diagnosis present

## 2022-09-26 DIAGNOSIS — B9561 Methicillin susceptible Staphylococcus aureus infection as the cause of diseases classified elsewhere: Secondary | ICD-10-CM

## 2022-09-26 DIAGNOSIS — F32A Depression, unspecified: Secondary | ICD-10-CM | POA: Diagnosis not present

## 2022-09-26 DIAGNOSIS — G9600 Cerebrospinal fluid leak, unspecified: Secondary | ICD-10-CM | POA: Diagnosis not present

## 2022-09-26 DIAGNOSIS — Z66 Do not resuscitate: Secondary | ICD-10-CM | POA: Diagnosis present

## 2022-09-26 DIAGNOSIS — G43909 Migraine, unspecified, not intractable, without status migrainosus: Secondary | ICD-10-CM | POA: Diagnosis not present

## 2022-09-26 DIAGNOSIS — G43809 Other migraine, not intractable, without status migrainosus: Secondary | ICD-10-CM

## 2022-09-26 DIAGNOSIS — N179 Acute kidney failure, unspecified: Secondary | ICD-10-CM | POA: Diagnosis present

## 2022-09-26 DIAGNOSIS — R6520 Severe sepsis without septic shock: Secondary | ICD-10-CM

## 2022-09-26 DIAGNOSIS — G2581 Restless legs syndrome: Secondary | ICD-10-CM

## 2022-09-26 DIAGNOSIS — Z820 Family history of epilepsy and other diseases of the nervous system: Secondary | ICD-10-CM

## 2022-09-26 DIAGNOSIS — G9341 Metabolic encephalopathy: Secondary | ICD-10-CM | POA: Diagnosis not present

## 2022-09-26 DIAGNOSIS — R4182 Altered mental status, unspecified: Secondary | ICD-10-CM | POA: Diagnosis not present

## 2022-09-26 DIAGNOSIS — Z801 Family history of malignant neoplasm of trachea, bronchus and lung: Secondary | ICD-10-CM | POA: Diagnosis not present

## 2022-09-26 DIAGNOSIS — G009 Bacterial meningitis, unspecified: Secondary | ICD-10-CM | POA: Diagnosis present

## 2022-09-26 DIAGNOSIS — M545 Low back pain, unspecified: Secondary | ICD-10-CM

## 2022-09-26 DIAGNOSIS — M4326 Fusion of spine, lumbar region: Secondary | ICD-10-CM | POA: Diagnosis not present

## 2022-09-26 DIAGNOSIS — I361 Nonrheumatic tricuspid (valve) insufficiency: Secondary | ICD-10-CM | POA: Diagnosis not present

## 2022-09-26 DIAGNOSIS — Z8349 Family history of other endocrine, nutritional and metabolic diseases: Secondary | ICD-10-CM

## 2022-09-26 DIAGNOSIS — Z1152 Encounter for screening for COVID-19: Secondary | ICD-10-CM | POA: Diagnosis not present

## 2022-09-26 DIAGNOSIS — Y838 Other surgical procedures as the cause of abnormal reaction of the patient, or of later complication, without mention of misadventure at the time of the procedure: Secondary | ICD-10-CM | POA: Diagnosis present

## 2022-09-26 DIAGNOSIS — R531 Weakness: Secondary | ICD-10-CM | POA: Diagnosis not present

## 2022-09-26 DIAGNOSIS — R7881 Bacteremia: Secondary | ICD-10-CM

## 2022-09-26 DIAGNOSIS — A419 Sepsis, unspecified organism: Secondary | ICD-10-CM | POA: Diagnosis not present

## 2022-09-26 DIAGNOSIS — T8149XA Infection following a procedure, other surgical site, initial encounter: Secondary | ICD-10-CM

## 2022-09-26 DIAGNOSIS — A4901 Methicillin susceptible Staphylococcus aureus infection, unspecified site: Secondary | ICD-10-CM | POA: Diagnosis not present

## 2022-09-26 DIAGNOSIS — S32048A Other fracture of fourth lumbar vertebra, initial encounter for closed fracture: Secondary | ICD-10-CM | POA: Diagnosis not present

## 2022-09-26 DIAGNOSIS — E876 Hypokalemia: Secondary | ICD-10-CM | POA: Diagnosis present

## 2022-09-26 DIAGNOSIS — R17 Unspecified jaundice: Secondary | ICD-10-CM | POA: Diagnosis present

## 2022-09-26 DIAGNOSIS — D649 Anemia, unspecified: Secondary | ICD-10-CM | POA: Diagnosis present

## 2022-09-26 DIAGNOSIS — R509 Fever, unspecified: Secondary | ICD-10-CM | POA: Diagnosis not present

## 2022-09-26 DIAGNOSIS — G9609 Other spinal cerebrospinal fluid leak: Secondary | ICD-10-CM | POA: Diagnosis present

## 2022-09-26 DIAGNOSIS — Z888 Allergy status to other drugs, medicaments and biological substances status: Secondary | ICD-10-CM | POA: Diagnosis not present

## 2022-09-26 DIAGNOSIS — R197 Diarrhea, unspecified: Secondary | ICD-10-CM | POA: Diagnosis not present

## 2022-09-26 DIAGNOSIS — M4636 Infection of intervertebral disc (pyogenic), lumbar region: Secondary | ICD-10-CM | POA: Diagnosis not present

## 2022-09-26 DIAGNOSIS — G9340 Encephalopathy, unspecified: Secondary | ICD-10-CM | POA: Diagnosis not present

## 2022-09-26 DIAGNOSIS — S32049A Unspecified fracture of fourth lumbar vertebra, initial encounter for closed fracture: Secondary | ICD-10-CM | POA: Diagnosis not present

## 2022-09-26 DIAGNOSIS — Z886 Allergy status to analgesic agent status: Secondary | ICD-10-CM | POA: Diagnosis not present

## 2022-09-26 DIAGNOSIS — G9782 Other postprocedural complications and disorders of nervous system: Secondary | ICD-10-CM | POA: Diagnosis not present

## 2022-09-26 DIAGNOSIS — A4101 Sepsis due to Methicillin susceptible Staphylococcus aureus: Secondary | ICD-10-CM | POA: Diagnosis not present

## 2022-09-26 DIAGNOSIS — G003 Staphylococcal meningitis: Secondary | ICD-10-CM | POA: Diagnosis not present

## 2022-09-26 DIAGNOSIS — Z0389 Encounter for observation for other suspected diseases and conditions ruled out: Secondary | ICD-10-CM | POA: Diagnosis not present

## 2022-09-26 DIAGNOSIS — M48061 Spinal stenosis, lumbar region without neurogenic claudication: Secondary | ICD-10-CM | POA: Diagnosis not present

## 2022-09-26 DIAGNOSIS — D72829 Elevated white blood cell count, unspecified: Secondary | ICD-10-CM | POA: Diagnosis not present

## 2022-09-26 DIAGNOSIS — R000 Tachycardia, unspecified: Secondary | ICD-10-CM | POA: Diagnosis not present

## 2022-09-26 DIAGNOSIS — N289 Disorder of kidney and ureter, unspecified: Secondary | ICD-10-CM | POA: Diagnosis not present

## 2022-09-26 DIAGNOSIS — S32059A Unspecified fracture of fifth lumbar vertebra, initial encounter for closed fracture: Secondary | ICD-10-CM | POA: Diagnosis not present

## 2022-09-26 DIAGNOSIS — T847XXA Infection and inflammatory reaction due to other internal orthopedic prosthetic devices, implants and grafts, initial encounter: Secondary | ICD-10-CM

## 2022-09-26 HISTORY — DX: Nausea with vomiting, unspecified: R11.2

## 2022-09-26 HISTORY — DX: Nausea with vomiting, unspecified: Z98.890

## 2022-09-26 LAB — LACTIC ACID, PLASMA
Lactic Acid, Venous: 2 mmol/L (ref 0.5–1.9)
Lactic Acid, Venous: 1.1 mmol/L (ref 0.5–1.9)

## 2022-09-26 LAB — RESP PANEL BY RT-PCR (RSV, FLU A&B, COVID)  RVPGX2
Influenza A by PCR: NEGATIVE
Influenza B by PCR: NEGATIVE
Resp Syncytial Virus by PCR: NEGATIVE
SARS Coronavirus 2 by RT PCR: NEGATIVE

## 2022-09-26 LAB — COMPREHENSIVE METABOLIC PANEL
ALT: 31 U/L (ref 0–44)
AST: 57 U/L — ABNORMAL HIGH (ref 15–41)
Albumin: 3.4 g/dL — ABNORMAL LOW (ref 3.5–5.0)
Alkaline Phosphatase: 110 U/L (ref 38–126)
Anion gap: 12 (ref 5–15)
BUN: 13 mg/dL (ref 8–23)
CO2: 21 mmol/L — ABNORMAL LOW (ref 22–32)
Calcium: 8.6 mg/dL — ABNORMAL LOW (ref 8.9–10.3)
Chloride: 99 mmol/L (ref 98–111)
Creatinine, Ser: 1.4 mg/dL — ABNORMAL HIGH (ref 0.44–1.00)
GFR, Estimated: 42 mL/min — ABNORMAL LOW (ref >60)
Glucose, Bld: 159 mg/dL — ABNORMAL HIGH (ref 70–99)
Potassium: 3.7 mmol/L (ref 3.5–5.1)
Sodium: 132 mmol/L — ABNORMAL LOW (ref 135–145)
Total Bilirubin: 0.5 mg/dL (ref 0.3–1.2)
Total Protein: 6.4 g/dL — ABNORMAL LOW (ref 6.5–8.1)

## 2022-09-26 LAB — BLOOD CULTURE ID PANEL (REFLEXED) - BCID2

## 2022-09-26 LAB — CBC WITH DIFFERENTIAL/PLATELET
Abs Immature Granulocytes: 0.07 10*3/uL (ref 0.00–0.07)
Basophils Absolute: 0 10*3/uL (ref 0.0–0.1)
Basophils Relative: 0 %
Eosinophils Absolute: 0 10*3/uL (ref 0.0–0.5)
Eosinophils Relative: 0 %
HCT: 34.2 % — ABNORMAL LOW (ref 36.0–46.0)
Hemoglobin: 10.8 g/dL — ABNORMAL LOW (ref 12.0–15.0)
Immature Granulocytes: 1 %
Lymphocytes Relative: 2 %
Lymphs Abs: 0.3 10*3/uL — ABNORMAL LOW (ref 0.7–4.0)
MCH: 28.5 pg (ref 26.0–34.0)
MCHC: 31.6 g/dL (ref 30.0–36.0)
MCV: 90.2 fL (ref 80.0–100.0)
Monocytes Absolute: 0.4 10*3/uL (ref 0.1–1.0)
Monocytes Relative: 3 %
Neutro Abs: 12.3 10*3/uL — ABNORMAL HIGH (ref 1.7–7.7)
Neutrophils Relative %: 94 %
Platelets: 348 10*3/uL (ref 150–400)
RBC: 3.79 MIL/uL — ABNORMAL LOW (ref 3.87–5.11)
RDW: 14 % (ref 11.5–15.5)
WBC: 13.1 10*3/uL — ABNORMAL HIGH (ref 4.0–10.5)
nRBC: 0 % (ref 0.0–0.2)

## 2022-09-26 LAB — HIV ANTIBODY (ROUTINE TESTING W REFLEX): HIV Screen 4th Generation wRfx: NONREACTIVE

## 2022-09-26 LAB — URINALYSIS, W/ REFLEX TO CULTURE (INFECTION SUSPECTED)
Bilirubin Urine: NEGATIVE
Glucose, UA: NEGATIVE mg/dL
Ketones, ur: NEGATIVE mg/dL
Nitrite: POSITIVE — AB
Protein, ur: NEGATIVE mg/dL
Specific Gravity, Urine: 1.012 (ref 1.005–1.030)
pH: 5 (ref 5.0–8.0)

## 2022-09-26 LAB — PROTIME-INR
INR: 1.2 (ref 0.8–1.2)
Prothrombin Time: 15 seconds (ref 11.4–15.2)

## 2022-09-26 LAB — PROCALCITONIN: Procalcitonin: 4.18 ng/mL

## 2022-09-26 LAB — APTT: aPTT: 37 seconds — ABNORMAL HIGH (ref 24–36)

## 2022-09-26 MED ORDER — METRONIDAZOLE 500 MG/100ML IV SOLN
500.0000 mg | Freq: Once | INTRAVENOUS | Status: AC
  Administered 2022-09-26: 500 mg via INTRAVENOUS
  Filled 2022-09-26: qty 100

## 2022-09-26 MED ORDER — ORAL CARE MOUTH RINSE: 15.0000 mL | OROMUCOSAL | Status: DC | PRN

## 2022-09-26 MED ORDER — LACTATED RINGERS IV SOLN: INTRAVENOUS | Status: DC

## 2022-09-26 MED ORDER — SODIUM CHLORIDE 0.9% FLUSH
3.0000 mL | Freq: Two times a day (BID) | INTRAVENOUS | Status: DC
  Administered 2022-09-26 – 2022-10-02 (×9): 3 mL via INTRAVENOUS

## 2022-09-26 MED ORDER — KETOROLAC TROMETHAMINE 15 MG/ML IJ SOLN
15.0000 mg | Freq: Once | INTRAMUSCULAR | Status: AC
  Administered 2022-09-26: 15 mg via INTRAVENOUS
  Filled 2022-09-26: qty 1

## 2022-09-26 MED ORDER — ACETAMINOPHEN 650 MG RE SUPP
650.0000 mg | Freq: Once | RECTAL | Status: AC
  Administered 2022-09-26: 650 mg via RECTAL
  Filled 2022-09-26: qty 1

## 2022-09-26 MED ORDER — SODIUM CHLORIDE 0.9 % IV SOLN
2.0000 g | Freq: Two times a day (BID) | INTRAVENOUS | Status: DC
  Administered 2022-09-26: 2 g via INTRAVENOUS
  Filled 2022-09-26: qty 12.5

## 2022-09-26 MED ORDER — ACETAMINOPHEN 650 MG RE SUPP: 650.0000 mg | Freq: Four times a day (QID) | RECTAL | Status: DC | PRN

## 2022-09-26 MED ORDER — ENOXAPARIN SODIUM 40 MG/0.4ML IJ SOSY: 40.0000 mg | PREFILLED_SYRINGE | INTRAMUSCULAR | Status: DC

## 2022-09-26 MED ORDER — MIDAZOLAM HCL 2 MG/2ML IJ SOLN: 2.0000 mg | Freq: Once | INTRAMUSCULAR | Status: AC

## 2022-09-26 MED ORDER — METHOCARBAMOL 500 MG PO TABS
500.0000 mg | ORAL_TABLET | Freq: Three times a day (TID) | ORAL | Status: DC | PRN
  Administered 2022-09-26 – 2022-09-27 (×2): 500 mg via ORAL
  Filled 2022-09-26 (×2): qty 1

## 2022-09-26 MED ORDER — OXYCODONE HCL 5 MG PO TABS
5.0000 mg | ORAL_TABLET | Freq: Four times a day (QID) | ORAL | Status: AC | PRN
  Administered 2022-09-26 – 2022-09-27 (×3): 5 mg via ORAL
  Filled 2022-09-26 (×3): qty 1

## 2022-09-26 MED ORDER — POLYETHYLENE GLYCOL 3350 17 G PO PACK: 17.0000 g | PACK | Freq: Every day | ORAL | Status: DC | PRN

## 2022-09-26 MED ORDER — LACTATED RINGERS IV BOLUS
1000.0000 mL | Freq: Once | INTRAVENOUS | Status: AC
  Administered 2022-09-26: 1000 mL via INTRAVENOUS

## 2022-09-26 MED ORDER — ACETAMINOPHEN 500 MG PO TABS
1000.0000 mg | ORAL_TABLET | Freq: Once | ORAL | Status: AC
  Administered 2022-09-26: 1000 mg via ORAL
  Filled 2022-09-26: qty 2

## 2022-09-26 MED ORDER — ACETAMINOPHEN 325 MG PO TABS: 650.0000 mg | ORAL_TABLET | Freq: Four times a day (QID) | ORAL | Status: DC | PRN

## 2022-09-26 MED ORDER — VANCOMYCIN HCL 1500 MG/300ML IV SOLN
1500.0000 mg | Freq: Once | INTRAVENOUS | Status: AC
  Administered 2022-09-26: 1500 mg via INTRAVENOUS
  Filled 2022-09-26: qty 300

## 2022-09-26 MED ORDER — IBUPROFEN 400 MG PO TABS
600.0000 mg | ORAL_TABLET | Freq: Four times a day (QID) | ORAL | Status: DC | PRN
  Administered 2022-09-26 – 2022-10-01 (×12): 600 mg via ORAL
  Filled 2022-09-26 (×3): qty 2
  Filled 2022-09-26: qty 1
  Filled 2022-09-26 (×8): qty 2

## 2022-09-26 MED ORDER — METRONIDAZOLE 500 MG/100ML IV SOLN
500.0000 mg | Freq: Two times a day (BID) | INTRAVENOUS | Status: DC
  Administered 2022-09-26: 500 mg via INTRAVENOUS
  Filled 2022-09-26: qty 100

## 2022-09-26 MED ORDER — PROCHLORPERAZINE EDISYLATE 10 MG/2ML IJ SOLN
10.0000 mg | Freq: Once | INTRAMUSCULAR | Status: AC
  Administered 2022-09-26: 10 mg via INTRAVENOUS
  Filled 2022-09-26: qty 2

## 2022-09-26 MED ORDER — VANCOMYCIN HCL IN DEXTROSE 1-5 GM/200ML-% IV SOLN: 1000.0000 mg | Freq: Once | INTRAVENOUS | Status: DC

## 2022-09-26 MED ORDER — VANCOMYCIN VARIABLE DOSE PER UNSTABLE RENAL FUNCTION (PHARMACIST DOSING): Status: DC

## 2022-09-26 MED ORDER — LACTATED RINGERS IV SOLN
150.0000 mL/h | INTRAVENOUS | Status: AC
  Administered 2022-09-26 – 2022-09-27 (×3): 150 mL/h via INTRAVENOUS

## 2022-09-26 MED ORDER — DROPERIDOL 2.5 MG/ML IJ SOLN
1.2500 mg | Freq: Once | INTRAMUSCULAR | Status: AC
  Administered 2022-09-26: 1.25 mg via INTRAVENOUS
  Filled 2022-09-26: qty 2

## 2022-09-26 MED ORDER — MIDAZOLAM HCL 2 MG/2ML IJ SOLN
INTRAMUSCULAR | Status: AC
  Administered 2022-09-26: 2 mg via INTRAMUSCULAR
  Filled 2022-09-26: qty 2

## 2022-09-26 MED ORDER — SODIUM CHLORIDE 0.9 % IV SOLN
2.0000 g | Freq: Once | INTRAVENOUS | Status: AC
  Administered 2022-09-26: 2 g via INTRAVENOUS
  Filled 2022-09-26: qty 12.5

## 2022-09-26 MED ORDER — ACETAMINOPHEN 500 MG PO TABS
1000.0000 mg | ORAL_TABLET | Freq: Three times a day (TID) | ORAL | Status: DC
  Administered 2022-09-27 – 2022-10-09 (×31): 1000 mg via ORAL
  Filled 2022-09-26 (×36): qty 2

## 2022-09-26 MED ORDER — CEFAZOLIN SODIUM-DEXTROSE 2-4 GM/100ML-% IV SOLN
2.0000 g | Freq: Three times a day (TID) | INTRAVENOUS | Status: DC
  Administered 2022-09-27: 2 g via INTRAVENOUS
  Filled 2022-09-26: qty 100

## 2022-09-26 MED ORDER — SODIUM CHLORIDE 0.9 % IV SOLN
2.0000 g | Freq: Once | INTRAVENOUS | Status: AC
  Administered 2022-09-26: 2 g via INTRAVENOUS
  Filled 2022-09-26: qty 2000

## 2022-09-26 MED ORDER — LACTATED RINGERS IV BOLUS (SEPSIS)
1000.0000 mL | Freq: Once | INTRAVENOUS | Status: AC
  Administered 2022-09-26: 1000 mL via INTRAVENOUS

## 2022-09-26 MED ORDER — ELETRIPTAN HYDROBROMIDE 20 MG PO TABS
40.0000 mg | ORAL_TABLET | ORAL | Status: AC | PRN
  Administered 2022-09-26 (×2): 40 mg via ORAL
  Filled 2022-09-26 (×4): qty 2

## 2022-09-26 NOTE — ED Provider Notes (Signed)
[facility] Provider Note   CSN: [id number] Arrival date & time: 09/26/22  [id number]     History  Chief Complaint  Patient presents with   Altered Mental Status   Claudication    Amanda [name] is a 62 y.o. female.  HPI Patient presents with her husband who assists with the history.  She is here due to altered mental status, restlessness.  History is notable for multiple medical issues, but she was generally well, had planned lumbar spine surgery 2 weeks ago.  Recovery was generally unremarkable until the past 12 hours.  She, since that time, has become restless, agitated, disoriented.  She is intermittently following commands, and participating in conversation, but then again is disoriented.  History is obtained by the patient's husband at bedside. Level 5 caveat secondary to acuity of condition.    Home Medications Prior to Admission medications   Medication Sig Start Date End Date Taking? Authorizing Provider  aspirin-acetaminophen-caffeine (EXCEDRIN MIGRAINE) [phone] MG tablet Take 2 tablets by mouth every 6 (six) hours as needed for headache or migraine.    [provider]  Calcium Carbonate-Vit D-Min (CALCIUM 1200 PO) Take 1,200 mg by mouth 2 (two) times daily.     [provider]  cholecalciferol (VITAMIN D3) 25 MCG (1000 UNIT) tablet Take 1,000 Units by mouth daily.    [provider]  clonazePAM (KLONOPIN) 1 MG tablet TAKE ONE TABLET BY MOUTH TWICE A DAY MAY TAKE ADDITIONAL TABLET IF NEEDED 04/13/22   Hurst, Helene Kelp T, PA-C  eletriptan (RELPAX) 40 MG tablet TAKE ONE TABLET BY MOUTH AT ONSET OF HEADACHE; MAY REPEAT ONE TABLET IN 2 HOURS IF NEEDED. 08/05/22   Donnal Moat T, PA-C  esomeprazole (NEXIUM) 20 MG capsule Take 20 mg by mouth daily at 12 noon.    [provider]  gabapentin (NEURONTIN) 300 MG capsule TAKE THREE CAPSULES BY MOUTH EVERY NIGHT AT BEDTIME 05/18/22   Addison Lank, PA-C   Magnesium 400 MG CAPS Take 400 mg by mouth 2 (two) times daily.     [provider]  ondansetron (ZOFRAN) 8 MG tablet TAKE 1 TABLET BY MOUTH EVERY 8 HOURS AS NEEDED FOR NAUSEA AND VOMITING 03/31/22   Adelene Idler, Dorothea Glassman, PA-C  oxyCODONE-acetaminophen (PERCOCET) 10-325 MG tablet Take 1 tablet by mouth 3 (three) times daily as needed. Patient not taking: Reported on 04/19/2022 08/26/21   [provider]  Plecanatide (TRULANCE PO) Take 3 mg by mouth. Patient not taking: Reported on 04/19/2022    [provider]  promethazine (PHENERGAN) 12.5 MG tablet  09/28/19   [provider]  venlafaxine XR (EFFEXOR-XR) 150 MG 24 hr capsule TAKE ONE CAPSULE BY MOUTH DAILY WITH BREAKFAST, Take with the 75 mg = 225 mg daily. 09/23/21   Donnal Moat T, PA-C  venlafaxine XR (EFFEXOR-XR) 75 MG 24 hr capsule TAKE ONE CAPSULE BY MOUTH EVERY MORNING BEFORE BREAKFAST 09/21/22   Donnal Moat T, PA-C      Allergies    Wellbutrin [bupropion] and Aspirin    Review of Systems   Review of Systems  Unable to perform ROS: Acuity of condition    Physical Exam Updated Vital Signs BP 135/72   Pulse (!) 128   Temp (!) 105 F (40.6 C) (Rectal)   Resp (!) 23   Ht '5\' 8"'$  (1.727 m)   Wt 80.7 kg   SpO2 98%   BMI 27.06 kg/m  Physical Exam Vitals and nursing note  reviewed.  Constitutional:      Appearance: She is well-developed. She is ill-appearing.     Comments: Ill-appearing adult female awake and alert moving nearly consistently around the bed, occasionally calm, participatory  HENT:     Head: Normocephalic and atraumatic.  Eyes:     Conjunctiva/sclera: Conjunctivae normal.  Cardiovascular:     Rate and Rhythm: Regular rhythm. Tachycardia present.  Pulmonary:     Effort: Pulmonary effort is normal. Tachypnea present. No respiratory distress.     Breath sounds: No stridor.  Abdominal:     General: There is no distension.  Skin:    General: Skin is warm and dry.       Neurological:      Mental Status: She is alert and oriented to person, place, and time.     Cranial Nerves: No cranial nerve deficit.     Comments: Inconsistently the patient does follow commands, but throughout the evaluation she is moving all extremities spontaneously, actively.  Speech is generally coherent, clear, but not consistently so.  Psychiatric:     Comments: Anxious affect     ED Results / Procedures / Treatments   Labs (all labs ordered are listed, but only abnormal results are displayed) Labs Reviewed  COMPREHENSIVE METABOLIC PANEL - Abnormal; Notable for the following components:      Result Value   Sodium 132 (*)    CO2 21 (*)    Glucose, Bld 159 (*)    Creatinine, Ser 1.40 (*)    Calcium 8.6 (*)    Total Protein 6.4 (*)    Albumin 3.4 (*)    AST 57 (*)    GFR, Estimated 42 (*)    All other components within normal limits  CBC WITH DIFFERENTIAL/PLATELET - Abnormal; Notable for the following components:   WBC 13.1 (*)    RBC 3.79 (*)    Hemoglobin 10.8 (*)    HCT 34.2 (*)    Neutro Abs 12.3 (*)    Lymphs Abs 0.3 (*)    All other components within normal limits  APTT - Abnormal; Notable for the following components:   aPTT 37 (*)    All other components within normal limits  CULTURE, BLOOD (ROUTINE X 2)  CULTURE, BLOOD (ROUTINE X 2)  RESP PANEL BY RT-PCR (RSV, FLU A&B, COVID)  [id number]  AEROBIC/ANAEROBIC CULTURE W GRAM STAIN (SURGICAL/DEEP WOUND)  PROTIME-INR  LACTIC ACID, PLASMA  LACTIC ACID, PLASMA  URINALYSIS, W/ REFLEX TO CULTURE (INFECTION SUSPECTED)    EKG None  Radiology DG Chest Port 1 View  Result Date: 09/26/2022 CLINICAL DATA:  Questionable sepsis EXAM: PORTABLE CHEST 1 VIEW COMPARISON:  06/08/2016 FINDINGS: Artifact from EKG leads. Normal heart size and mediastinal contours allowing for distortion due to rightward rotation. No acute infiltrate or edema. No effusion or pneumothorax. No acute osseous findings. IMPRESSION: No evidence of active disease.  Electronically Signed   By: Jorje Guild M.D.   On: 09/26/2022 07:27    Procedures Procedures    Medications Ordered in ED Medications  lactated ringers infusion ( Intravenous New Bag/Given 09/26/22 0730)  metroNIDAZOLE (FLAGYL) IVPB 500 mg (has no administration in time range)  vancomycin (VANCOREADY) IVPB 1500 mg/300 mL (has no administration in time range)  midazolam (VERSED) injection 2 mg (2 mg Intramuscular Given 09/26/22 0700)  acetaminophen (TYLENOL) suppository 650 mg (650 mg Rectal Given 09/26/22 0726)  ceFEPIme (MAXIPIME) 2 g in sodium chloride 0.9 % 100 mL IVPB (2 g Intravenous New Bag/Given 09/26/22 0733)  ED Course/ Medical Decision Making/ A&P                             Medical Decision Making Adult female with multiple medical issues, generally well now presents with acute confusion in the context of recent lumbar spine surgery.  Patient is awake, alert, but agitated, disoriented, febrile, tachycardic, tachypneic.  Code sepsis initiated, broad-spectrum antibiotics started, patient received antipyretics, and after initial attempt with Tylenol did not result in substantial change, received Toradol, though she has not acknowledged aspirin adverse reaction with ear ringing.  Additionally, patient found to have evidence for acute kidney injury, fluid resuscitation started, though she was not initially hypotensive.  Amount and/or Complexity of Data Reviewed Independent Historian: spouse External Data Reviewed: notes. Labs: ordered. Decision-making details documented in ED Course. Radiology: ordered and independent interpretation performed. Decision-making details documented in ED Course. ECG/medicine tests: ordered and independent interpretation performed. Decision-making details documented in ED Course.  Risk OTC drugs. Prescription drug management. Decision regarding hospitalization.   8:58 AM Patient continues to be restless, will require droperidol to facilitate CT  scan.  I discussed her case at bedside with her neurosurgeon, will discuss plans, interventions thus far, indication for CT scan.  Update: On return from CT the patient is markedly better.  Heart rate now closer to 100, fever has diminished, though not resolved entirely.  She is now oriented appropriately.  I have reviewed the findings, discussed them with her and her husband.  She has received antibiotics, as above, and after discussion with pharmacist, ampicillin was added to the coverage.   Update: Patient's condition similar to most recent update.  She will be admitted to internal medicine team, with neurosurgery following as a consulting team.  This adult female presents several weeks after lumbar spine surgery now with acute confusion.  On arrival the patient is found to be febrile, tachycardic, tachypneic, but not hypotensive.  With concern for sepsis versus SIRS with acute encephalopathy she received broad-spectrum antibiotics, antipyretics, consultation with her neurosurgical team.  As above findings concerning for leukocytosis, slight creatinine elevation.  Some suspicion for the patient's fever contributing to her encephalopathy given her substantial improvement after antipyretics, fluids here.  As above patient required admission to the internal medicine team, neurosurgery following with anticipated ongoing monitoring, management including consideration of lumbar puncture as necessary.        Final Clinical Impression(s) / ED Diagnoses Final diagnoses:  Sepsis with encephalopathy without septic shock, due to unspecified organism [facility])  CRITICAL CARE Performed by: Carmin Muskrat Total critical care time: 45 minutes Critical care time was exclusive of separately billable procedures and treating other patients. Critical care was necessary to treat or prevent imminent or life-threatening deterioration. Critical care was time spent personally by me on the following activities:  development of treatment plan with patient and/or surrogate as well as nursing, discussions with consultants, evaluation of patient's response to treatment, examination of patient, obtaining history from patient or surrogate, ordering and performing treatments and interventions, ordering and review of laboratory studies, ordering and review of radiographic studies, pulse oximetry and re-evaluation of patient's condition.    Carmin Muskrat, MD 09/26/22 [id number]

## 2022-09-26 NOTE — Progress Notes (Signed)
Paged Dr patient has been have bilateral leg cramps off and on since arrival to unit. She is also having a migraine with pain in back of neck as well. Pt had in/out cath prior to being transferred to unit. Pt felt urge to void and was assisted to bedside commode. Pt voided small amount but was experiencing a lot of pain in legs and neck and returned to bed. Pt resting with call bell within reach.  Will continue to monitor. Daughters are at bedside. Payton Emerald, RN

## 2022-09-26 NOTE — Progress Notes (Signed)
Pt got up to bedside commode for second time and voided approximately 50 ml. Urine light yellow color but had what appeared to be a moderate amount of an oily substance. Md called and made aware. Applied ice pack to neck to help with HA and neck pain. Heat pads to legs to help with cramping. Payton Emerald, RN

## 2022-09-26 NOTE — H&P (Signed)
History and Physical   Amanda [name] DOB: April 03, 1961 DOA: 09/26/2022  PCP: Carol Ada, MD   Patient coming from: Home  Chief Complaint: Restless, altered mental status  HPI: Amanda [name] is a 61 y.o. female with medical history significant of RLS, migraines, low back pain, depression presenting with altered mental status and restlessness.  History obtained with assistance of chart review and family.  Patient is less confused than when she came in.  She had a lumbar spine surgery 2 weeks ago electively and has had unremarkable recovery up until the past 12 hours.  She has become restless and agitated and disoriented with at times and inability to follow commands and or participate in conversation.  Her mentation has been waxing and waning with periods of being improved, but has largely been disoriented and agitated.  Does report increased back pain for the past 3 to 4 days.  And she states to me that she does have some neck stiffness at this time which she did not have earlier this morning as well as a developing headache (but she has a significant history of headaches).  No recorded fevers at home, but did not check.  Denies chest pain, shortness of breath, abdominal pain, constipation, diarrhea, nausea, vomiting.  ED Course: Vital signs in ED significant for fever 105, heart rate in the 100s to 120s, blood pressure in the [id number] to 0000000 systolic, respiratory rate in the 20s.  Lab workup shows CMP with sodium 132, bicarb 21, creatinine 1.4 from baseline of 1, glucose 159, calcium 8.6, protein 6.4, albumin 3.4, AST 57.  CBC with leukocytosis of 13.1 and hemoglobin of 10.8 from unknown recent baseline as it was 13 6 years ago.  PT and INR normal.  PTT mildly elevated at 37.  Lactic acid borderline at 2 and then normal on repeat.  Respiratory panel for flu COVID and RSV negative.  Urinalysis, blood cultures, wound cultures pending.  Chest x-ray showed no acute abnormality.   CT of the L-spine showed recent L4-5 laminectomy with an L4 inferior articular process fracture near the site of osteotomy, also noted was spinal stenosis at L3-4 and L4-5.  No specific signs on this imaging pointing towards infection/osteomyelitis.  Neurosurgery was consulted and recommended broad-spectrum antibiotics, blood cultures, lumbar imaging and possible LP.  They evaluate surgical site and states that it looks well-healing.  The on-call morning surgeon will discuss the case with her surgeon earlier this afternoon when he comes in, they will decide on LP at that time.  Patient received vancomycin, cefepime, Flagyl, ampicillin in the ED.  Also received Tylenol and Toradol.  In addition received Compazine and Versed.  Did receive a dose of droperidol to obtain CT.  Also received a liter of fluids and started on a rate of fluids.  Review of Systems: As per HPI otherwise all other systems reviewed and are negative.  Past Medical History:  Diagnosis Date   Altered mental status 04/19/2016   Depression    History of total hysterectomy    Hyperglycemia 04/19/2016   Intractable migraine without aura 12/04/2012   Migraine with intractable migraine    Restless leg syndrome     Past Surgical History:  Procedure Laterality Date   TOTAL ABDOMINAL HYSTERECTOMY      Social History  reports that she has never smoked. She has never used smokeless tobacco. She reports current alcohol use of about 1.0 standard drink of alcohol per week. She reports that she does not use  drugs.  Allergies  Allergen Reactions   Wellbutrin [Bupropion]     GERD   Aspirin Other (See Comments)    Makes her ears ring    Family History  Problem Relation Age of Onset   Dementia Mother    Lung cancer Father    Migraines Sister    Migraines Sister    Migraines Brother    Hypothyroidism Sister    Hypothyroidism Sister    Hypothyroidism Sister   Reviewed on admission  Prior to Admission medications   Medication  Sig Start Date End Date Taking? Authorizing Provider  aspirin-acetaminophen-caffeine (EXCEDRIN MIGRAINE) [phone] MG tablet Take 2 tablets by mouth every 6 (six) hours as needed for headache or migraine.    [provider]  Calcium Carbonate-Vit D-Min (CALCIUM 1200 PO) Take 1,200 mg by mouth 2 (two) times daily.     [provider]  cholecalciferol (VITAMIN D3) 25 MCG (1000 UNIT) tablet Take 1,000 Units by mouth daily.    [provider]  clonazePAM (KLONOPIN) 1 MG tablet TAKE ONE TABLET BY MOUTH TWICE A DAY MAY TAKE ADDITIONAL TABLET IF NEEDED 04/13/22   Hurst, Helene Kelp T, PA-C  eletriptan (RELPAX) 40 MG tablet TAKE ONE TABLET BY MOUTH AT ONSET OF HEADACHE; MAY REPEAT ONE TABLET IN 2 HOURS IF NEEDED. 08/05/22   Donnal Moat T, PA-C  esomeprazole (NEXIUM) 20 MG capsule Take 20 mg by mouth daily at 12 noon.    [provider]  gabapentin (NEURONTIN) 300 MG capsule TAKE THREE CAPSULES BY MOUTH EVERY NIGHT AT BEDTIME 05/18/22   Addison Lank, PA-C  Magnesium 400 MG CAPS Take 400 mg by mouth 2 (two) times daily.     [provider]  ondansetron (ZOFRAN) 8 MG tablet TAKE 1 TABLET BY MOUTH EVERY 8 HOURS AS NEEDED FOR NAUSEA AND VOMITING 03/31/22   Adelene Idler, Dorothea Glassman, PA-C  oxyCODONE-acetaminophen (PERCOCET) 10-325 MG tablet Take 1 tablet by mouth 3 (three) times daily as needed. Patient not taking: Reported on 04/19/2022 08/26/21   [provider]  Plecanatide (TRULANCE PO) Take 3 mg by mouth. Patient not taking: Reported on 04/19/2022    [provider]  promethazine (PHENERGAN) 12.5 MG tablet  09/28/19   [provider]  venlafaxine XR (EFFEXOR-XR) 150 MG 24 hr capsule TAKE ONE CAPSULE BY MOUTH DAILY WITH BREAKFAST, Take with the 75 mg = 225 mg daily. 09/23/21   Donnal Moat T, PA-C  venlafaxine XR (EFFEXOR-XR) 75 MG 24 hr capsule TAKE ONE CAPSULE BY MOUTH EVERY MORNING BEFORE BREAKFAST 09/21/22   Addison Lank, [place]    Physical  Exam: Vitals:   09/26/22 1130 09/26/22 1145 09/26/22 1230 09/26/22 1245  BP: 117/67 102/72 102/63 (!) 108/57  Pulse: (!) 110 (!) 111 (!) 102 (!) 102  Resp: (!) 29 (!) 23 (!) 24 (!) 25  Temp:      TempSrc:      SpO2: 93% 96% 93% 94%  Weight:      Height:        Physical Exam Constitutional:      General: She is not in acute distress.    Appearance: Normal appearance.  HENT:     Head: Normocephalic and atraumatic.     Comments: Does have some neck stiffness/tenderness at this time    Mouth/Throat:     Mouth: Mucous membranes are moist.     Pharynx: Oropharynx is clear.  Eyes:     Extraocular Movements: Extraocular movements intact.  Pupils: Pupils are equal, round, and reactive to light.  Cardiovascular:     Rate and Rhythm: Regular rhythm. Tachycardia present.     Pulses: Normal pulses.     Heart sounds: Normal heart sounds.  Pulmonary:     Effort: Pulmonary effort is normal. No respiratory distress.     Breath sounds: Normal breath sounds.  Abdominal:     General: Bowel sounds are normal. There is no distension.     Palpations: Abdomen is soft.     Tenderness: There is no abdominal tenderness.  Musculoskeletal:        General: No swelling or deformity.  Skin:    General: Skin is warm and dry.  Neurological:     General: No focal deficit present.     Mental Status: Mental status is at baseline.    Labs on Admission: I have personally reviewed following labs and imaging studies  CBC: Recent Labs  Lab 09/26/22 0700  WBC 13.1*  NEUTROABS 12.3*  HGB 10.8*  HCT 34.2*  MCV 90.2  PLT 0000000    Basic Metabolic Panel: Recent Labs  Lab 09/26/22 0700  NA 132*  K 3.7  CL 99  CO2 21*  GLUCOSE 159*  BUN 13  CREATININE 1.40*  CALCIUM 8.6*    GFR: Estimated Creatinine Clearance: 45.8 mL/min (A) (by C-G formula based on SCr of 1.4 mg/dL (H)).  Liver Function Tests: Recent Labs  Lab 09/26/22 0700  AST 57*  ALT 31  ALKPHOS 110  BILITOT 0.5  PROT 6.4*   ALBUMIN 3.4*    Urine analysis:    Component Value Date/Time   COLORURINE YELLOW 04/18/2016 [place] 04/18/2016 2328   LABSPEC 1.018 04/18/2016 2328   PHURINE 5.5 04/18/2016 2328   GLUCOSEU NEGATIVE 04/18/2016 2328   HGBUR SMALL (A) 04/18/2016 2328   BILIRUBINUR SMALL (A) 04/18/2016 2328   KETONESUR 15 (A) 04/18/2016 2328   PROTEINUR NEGATIVE 04/18/2016 2328   NITRITE NEGATIVE 04/18/2016 2328   LEUKOCYTESUR NEGATIVE 04/18/2016 2328    Radiological Exams on Admission: CT Lumbar Spine Wo Contrast  Result Date: 09/26/2022 CLINICAL DATA:  Spine surgery.  Postop infection suspected EXAM: CT LUMBAR SPINE WITHOUT CONTRAST TECHNIQUE: Multidetector CT imaging of the lumbar spine was performed without intravenous contrast administration. Multiplanar CT image reconstructions were also generated. RADIATION DOSE REDUCTION: This exam was performed according to the departmental dose-optimization program which includes automated exposure control, adjustment of the mA and/or kV according to patient size and/or use of iterative reconstruction technique. COMPARISON:  07/17/2021 FINDINGS: Segmentation: 5 lumbar type vertebrae. Alignment: Normal. Vertebrae: L4-5 right laminotomy. No endplate destruction or bone lesion seen. There is a nondisplaced fracture at the inferior articular process of L4 on the right. See sagittal reformats. Paraspinal and other soft tissues: Fat stranding in keeping with history of fairly recent surgery. There is anterior epidural soft tissue density at the level of L4-5 which is presumably residual disc, no gross collection but very limited beyond the subcutaneous tissues due to technique. Disc levels: Disc space narrowing with endplate degeneration primarily at L2-3 and below. No bony impingement. On soft tissue windows there is some degree of spinal stenosis at L3-4 and L4-5. IMPRESSION: 1. Changes of recent L4-5 right laminotomy. Nondisplaced L4 right inferior  articular process fracture adjacent to the osteotomy. 2. No specific signs of osteomyelitis or infection. 3. Some degree of spinal stenosis at L3-4 and L4-5. Electronically Signed   By: Jorje Guild M.D.   On: 09/26/2022  10:37   DG Chest Port 1 View  Result Date: 09/26/2022 CLINICAL DATA:  Questionable sepsis EXAM: PORTABLE CHEST 1 VIEW COMPARISON:  06/08/2016 FINDINGS: Artifact from EKG leads. Normal heart size and mediastinal contours allowing for distortion due to rightward rotation. No acute infiltrate or edema. No effusion or pneumothorax. No acute osseous findings. IMPRESSION: No evidence of active disease. Electronically Signed   By: Jorje Guild M.D.   On: 09/26/2022 07:27    EKG: Independently reviewed.  Sinus tachycardia at 120 bpm.  Nonspecific T wave changes.  Assessment/Plan Principal Problem:   Severe sepsis (HCC) Active Problems:   Restless legs syndrome (RLS)   Other migraine without status migrainosus, not intractable   Low back pain   Severe sepsis Acute cephalopathy > Patient presenting with restlessness and intermittent disorientation with inability to participate in conversation and follow commands intermittently. > Had recent lumbar surgery 2 weeks ago and recovery was unremarkable until the past 12 hours. > Unclear source of infection at this time but does meet severe sepsis criteria with fever to 105, tachycardia, tachypnea, leukocytosis 13.1.  Source right now is presumed to be related to her surgical intervention versus CNS source.  Does report some increased back pain at the surgical site in the last 3 to 4 days. > Urinalysis is pending.  Chest x-ray showed no acute normality.  CT L-spine showed no obvious signs of infection or osteomyelitis.  No nuchal rigidity. > Her altered mentation is likely related to her infection and fever.  Suspect given the intermittent nature of it and her Tmax of 105 here that her confusion was coming with her fever spikes. >  Neurosurgery has evaluated the patient recommend continuing broad-spectrum antibiotics and are considering lumbar puncture.  On-call neurosurgeon to discuss with her neurosurgeon when he comes in earlier this afternoon. > Given patient does report some developing neck stiffness and headache  (though she does have history of headache), I would favor lumbar puncture without alternative clear source despite her confusion likely be more related to her fever spikes. - Monitor in progressive unit - Appreciate neurosurgery recommendations - Continue with broad-spectrum antibiotics: Vancomycin, cefepime, Flagyl - Procalcitonin - Complete sepsis fluid bolus with additional 1 L - Continue with IV fluids at 150 cc an hour - Trend fever curve and WBC - Scheduled Tylenol, as needed NSAIDs - Follow-up urinalysis, blood culture, wound culture  AKI > Creatinine elevated to 1.4 from baseline of around 1 per chart review.  In the setting of sepsis above. > Has received over a liter of fluids in the ED.  Will give additional liter to complete sepsis bolus. - 1 L bolus - Continuous IV fluids overnight - Trend renal function and electrolytes  Depression Migraine RLS - Holding home central acting medications in the setting of waxing and waning mentation: Including venlafaxine, Klonopin, gabapentin - On as needed Elitriptan outpatient  DVT prophylaxis: SCDs for now while we wait clarification if there will be any need for significant procedural/surgical intervention Code Status:   Full Family Communication:  Updated at bedside Disposition Plan:   Patient is from:  Home  Anticipated DC to:  Home  Anticipated DC date:  2 to 5 days  Anticipated DC barriers: None  Consults called:  Neurosurgery consulted in the ED and are following. Admission status:  Inpatient, progressive  Severity of Illness: The appropriate patient status for this patient is INPATIENT. Inpatient status is judged to be reasonable and  necessary in order to provide the required  intensity of service to ensure the patient's safety. The patient's presenting symptoms, physical exam findings, and initial radiographic and laboratory data in the context of their chronic comorbidities is felt to place them at high risk for further clinical deterioration. Furthermore, it is not anticipated that the patient will be medically stable for discharge from the hospital within 2 midnights of admission.   * I certify that at the point of admission it is my clinical judgment that the patient will require inpatient hospital care spanning beyond 2 midnights from the point of admission due to high intensity of service, high risk for further deterioration and high frequency of surveillance required.Marcelyn Bruins MD [facility]  How to contact the [facility] Attending or Consulting provider [place] or covering provider during after hours [place], for this patient?   Check the care team in [facility] and look for a) attending/consulting [facility] provider listed and b) the [facility] team listed Log into [email or web address] and use [place]'s universal password to access. If you do not have the password, please contact the hospital operator. Locate the [facility] provider you are looking for under [facility] and page to a number that you can be directly reached. If you still have difficulty reaching the provider, please page the [facility] (Director on Call) for the Hospitalists listed on amion for assistance.  09/26/2022, 1:00 PM

## 2022-09-26 NOTE — ED Triage Notes (Signed)
Arrives POV with family after waking up ~0300 "incoherent" per husband Went to bed ~ 2130 last night    Recent back surgery 2 weeks ago and has been having severe leg cramps.   Hx MDD.

## 2022-09-26 NOTE — Progress Notes (Signed)
Pt admit from ED. Pt husband at bedside pt A/ox4 chg bath given . Pt on Mx40-09 CCMD called   09/26/22 1517  Vitals  Temp 98.4 F (36.9 C)  Temp Source Oral  BP 101/60  MAP (mmHg) 73  BP Location Left Arm  BP Method Automatic  Patient Position (if appropriate) Lying  Pulse Rate 97  Pulse Rate Source Monitor  ECG Heart Rate 98  Resp 20  Level of Consciousness  Level of Consciousness Alert  MEWS COLOR  MEWS Score Color Green  Oxygen Therapy  SpO2 96 %  O2 Device Room Air  O2 Flow Rate (L/min) 0 L/min  MEWS Score  MEWS Temp 0  MEWS Systolic 0  MEWS Pulse 0  MEWS RR 0  MEWS LOC 0  MEWS Score 0

## 2022-09-26 NOTE — Progress Notes (Signed)
Pharmacy Antibiotic Note  Amanda [name] is a 62 y.o. female for which pharmacy has been consulted for cefepime and vancomycin dosing for sepsis.  Patient with a history of RLS, migraines, low back pain, depression. Patient is s/p lumbar spine surgery 2 weeks ago. Patient presenting with AMS.  SCr 1.4 - AKI WBC 13.1; LA 2; T 105>103.2; HR 128>102; RR 23>25 COVID neg / flu neg  Plan: Metronidazole per MD Cefepime 2g q12hr  Vancomycin 1500 mg given once - repeat dosing per level unless renal function becomes stable Trend WBC, Fever, Renal function F/u cultures, clinical course, WBC De-escalate when able  Height: '5\' 8"'$  (172.7 cm) Weight: 80.7 kg (178 lb) IBW/kg (Calculated) : 63.9  Temp (24hrs), Avg:102.9 F (39.4 C), Min:99.1 F (37.3 C), Max:105 F (40.6 C)  Recent Labs  Lab 09/26/22 0700 09/26/22 1016  WBC 13.1*  --   CREATININE 1.40*  --   LATICACIDVEN 2.0* 1.1    Estimated Creatinine Clearance: 45.8 mL/min (A) (by C-G formula based on SCr of 1.4 mg/dL (H)).    Allergies  Allergen Reactions   Wellbutrin [Bupropion]     GERD   Aspirin Other (See Comments)    Makes her ears ring   Antimicrobials this admission: Ampicillin x 1 in ED 3/3 flagyl 3/3 >>  cefepime 3/3 >>  vancomycin 3/3 >>   Microbiology results: Pending  Thank you for allowing pharmacy to be a part of this patient's care.  Lorelei Pont, PharmD, BCPS 09/26/2022 1:19 PM ED Clinical Pharmacist -  [phone]

## 2022-09-26 NOTE — Progress Notes (Signed)
[facility]link following for sepsis protocol. 

## 2022-09-26 NOTE — ED Notes (Signed)
Patient transported to CT 

## 2022-09-26 NOTE — Consult Note (Signed)
Chief Complaint   Chief Complaint  Patient presents with   Altered Mental Status   Claudication    History of Present Illness  Amanda [name] is a 62 y.o. female presenting to the hospital with relatively sudden onset of altered mental status.  History is obtained from the patient's husband.  She did undergo right L4-5 laminotomy and microdiscectomy at the outpatient surgical center on February 20, about 2 weeks ago.  Her recovery at home thus far has been relatively unremarkable.  Her husband states that she went to bed in her normal state of health, not reporting any significant headache, fevers, or confusion.  She woke up early this morning very confused complaining of cramping in her right leg and very restless.  She apparently did not improve significantly to her baseline and she was brought to the emergency department where she was noted to have relatively high fever, leukocytosis.  Reportedly, there was a small amount of serous drainage from the superior aspect of her wound.  The patient's husband notes that a few days ago he did note a small amount of thin drainage from the wound which essentially resolved.  He has not noted any significant redness or tenderness of the wound.  Past Medical History   Past Medical History:  Diagnosis Date   Depression    History of total hysterectomy    Migraine with intractable migraine    Restless leg syndrome     Past Surgical History   Past Surgical History:  Procedure Laterality Date   TOTAL ABDOMINAL HYSTERECTOMY      Social History   Social History   Tobacco Use   Smoking status: Never   Smokeless tobacco: Never  Substance Use Topics   Alcohol use: Yes    Alcohol/week: 1.0 standard drink of alcohol    Types: 1 Glasses of wine per week    Comment: one a year   Drug use: No    Medications   Prior to Admission medications   Medication Sig Start Date End Date Taking? Authorizing Provider  aspirin-acetaminophen-caffeine  (EXCEDRIN MIGRAINE) [phone] MG tablet Take 2 tablets by mouth every 6 (six) hours as needed for headache or migraine.    [provider]  Calcium Carbonate-Vit D-Min (CALCIUM 1200 PO) Take 1,200 mg by mouth 2 (two) times daily.     [provider]  cholecalciferol (VITAMIN D3) 25 MCG (1000 UNIT) tablet Take 1,000 Units by mouth daily.    [provider]  clonazePAM (KLONOPIN) 1 MG tablet TAKE ONE TABLET BY MOUTH TWICE A DAY MAY TAKE ADDITIONAL TABLET IF NEEDED 04/13/22   Hurst, Helene Kelp T, PA-C  eletriptan (RELPAX) 40 MG tablet TAKE ONE TABLET BY MOUTH AT ONSET OF HEADACHE; MAY REPEAT ONE TABLET IN 2 HOURS IF NEEDED. 08/05/22   Donnal Moat T, PA-C  esomeprazole (NEXIUM) 20 MG capsule Take 20 mg by mouth daily at 12 noon.    [provider]  gabapentin (NEURONTIN) 300 MG capsule TAKE THREE CAPSULES BY MOUTH EVERY NIGHT AT BEDTIME 05/18/22   Addison Lank, PA-C  Magnesium 400 MG CAPS Take 400 mg by mouth 2 (two) times daily.     [provider]  ondansetron (ZOFRAN) 8 MG tablet TAKE 1 TABLET BY MOUTH EVERY 8 HOURS AS NEEDED FOR NAUSEA AND VOMITING 03/31/22   Adelene Idler, Dorothea Glassman, PA-C  oxyCODONE-acetaminophen (PERCOCET) 10-325 MG tablet Take 1 tablet by mouth 3 (three) times daily as needed. Patient not taking: Reported on 04/19/2022 08/26/21  [provider]  Plecanatide (TRULANCE PO) Take 3 mg by mouth. Patient not taking: Reported on 04/19/2022    [provider]  promethazine (PHENERGAN) 12.5 MG tablet  09/28/19   [provider]  venlafaxine XR (EFFEXOR-XR) 150 MG 24 hr capsule TAKE ONE CAPSULE BY MOUTH DAILY WITH BREAKFAST, Take with the 75 mg = 225 mg daily. 09/23/21   Donnal Moat T, PA-C  venlafaxine XR (EFFEXOR-XR) 75 MG 24 hr capsule TAKE ONE CAPSULE BY MOUTH EVERY MORNING BEFORE BREAKFAST 09/21/22   Donnal Moat T, PA-C    Allergies   Allergies  Allergen Reactions   Wellbutrin [Bupropion]     GERD   Aspirin Other (See  Comments)    Makes her ears ring    Review of Systems  ROS  Neurologic Exam  Awake, alert, oriented to person, hospital, year however very confused with largely inappropriate speech. Speech fluent, confused CN grossly intact Patient does follow simple commands Motor exam appears essentially intact The wound appears closed, without any obvious area of dehiscence.  No active drainage is identified.  There is no significant erythema although the wound does appear to be somewhat tender to palpation.  Kernig's (-) No neck stiffness  Imaging  None  Impression  - 62 y.o. female 2 weeks status post right L4-5 microdiscectomy presenting with acute onset of altered mental status, fever, leukocytosis, and elevated lactic acid suggestive of sepsis.  Source unclear at this point.  Review of the operative report does not seem to indicate any intraoperative durotomy, nonetheless with the acute onset of symptoms I do think that meningitis is in the differential.  Plan  -Agree with initiation of broad-spectrum antibiotics including vancomycin and ceftriaxone -Can obtain imaging of the lumbar spine to identify possibility of underlying infection although with the appearance of the wound this seems less likely -Obtain blood cultures -Patient may require lumbar puncture above or below the laminotomy    Consuella Lose, MD [facility]

## 2022-09-26 NOTE — Progress Notes (Addendum)
PHARMACY - PHYSICIAN COMMUNICATION CRITICAL VALUE ALERT - BLOOD CULTURE IDENTIFICATION (BCID)  Amanda [name] is an 62 y.o. female who presented to [facility] on 09/26/2022 with a chief complaint of altered mental staus  Assessment:  MSSA 4 of 4  Name of physician (or Provider) Contacted: j Mansy  Current antibiotics: vancomycin, flagyl, cefepime  Changes to prescribed antibiotics recommended:  Recommendations accepted by provider D/C current Antibiotics and start cefazolin  Results for orders placed or performed during the hospital encounter of 09/26/22  Blood Culture ID Panel (Reflexed) (Collected: 09/26/2022  7:00 AM)  Result Value Ref Range   Enterococcus faecalis NOT DETECTED NOT DETECTED   Enterococcus Faecium NOT DETECTED NOT DETECTED   Listeria monocytogenes NOT DETECTED NOT DETECTED   Staphylococcus species DETECTED (A) NOT DETECTED   Staphylococcus aureus (BCID) DETECTED (A) NOT DETECTED   Staphylococcus epidermidis NOT DETECTED NOT DETECTED   Staphylococcus lugdunensis NOT DETECTED NOT DETECTED   Streptococcus species NOT DETECTED NOT DETECTED   Streptococcus agalactiae NOT DETECTED NOT DETECTED   Streptococcus pneumoniae NOT DETECTED NOT DETECTED   Streptococcus pyogenes NOT DETECTED NOT DETECTED   A.calcoaceticus-baumannii NOT DETECTED NOT DETECTED   Bacteroides fragilis NOT DETECTED NOT DETECTED   Enterobacterales NOT DETECTED NOT DETECTED   Enterobacter cloacae complex NOT DETECTED NOT DETECTED   Escherichia coli NOT DETECTED NOT DETECTED   Klebsiella aerogenes NOT DETECTED NOT DETECTED   Klebsiella oxytoca NOT DETECTED NOT DETECTED   Klebsiella pneumoniae NOT DETECTED NOT DETECTED   Proteus species NOT DETECTED NOT DETECTED   Salmonella species NOT DETECTED NOT DETECTED   Serratia marcescens NOT DETECTED NOT DETECTED   Haemophilus influenzae NOT DETECTED NOT DETECTED   Neisseria meningitidis NOT DETECTED NOT DETECTED   Pseudomonas aeruginosa NOT DETECTED NOT  DETECTED   Stenotrophomonas maltophilia NOT DETECTED NOT DETECTED   Candida albicans NOT DETECTED NOT DETECTED   Candida auris NOT DETECTED NOT DETECTED   Candida glabrata NOT DETECTED NOT DETECTED   Candida krusei NOT DETECTED NOT DETECTED   Candida parapsilosis NOT DETECTED NOT DETECTED   Candida tropicalis NOT DETECTED NOT DETECTED   Cryptococcus neoformans/gattii NOT DETECTED NOT DETECTED   Meth resistant mecA/C and MREJ NOT DETECTED NOT DETECTED    Georga Bora, PharmD Clinical Pharmacist 09/26/2022 11:43 PM Please check AMION for all [place] numbers

## 2022-09-26 NOTE — ED Notes (Signed)
ED TO INPATIENT HANDOFF REPORT  ED Nurse Name and Phone #: Overton Mam [phone]  S Name/Age/Gender Amanda [name] 62 y.o. female Room/Bed: 034C/034C  Code Status   Code Status: Full Code  Home/SNF/Other Home Patient oriented to: self, place, and time Is this baseline? No   Triage Complete: Triage complete  Chief Complaint Sepsis [facility]) [A41.9]  Triage Note Arrives POV with family after waking up ~0300 "incoherent" per husband Went to bed ~ 2130 last night    Recent back surgery 2 weeks ago and has been having severe leg cramps.   Hx MDD.      Allergies Allergies  Allergen Reactions   Wellbutrin [Bupropion]     GERD   Aspirin Other (See Comments)    Makes her ears ring    Level of Care/Admitting Diagnosis ED Disposition     ED Disposition  Admit   Condition  --   [place]: [place] [[id number]]  Level of Care: Progressive [102]  Admit to Progressive based on following criteria: MULTISYSTEM THREATS such as stable sepsis, metabolic/electrolyte imbalance with or without encephalopathy that is responding to early treatment.  May admit patient to [facility] or [facility] if equivalent level of care is available:: No  Covid Evaluation: Confirmed COVID Negative  Diagnosis: Sepsis [facility]) [id number]  Admitting Physician: Marcelyn Bruins [id number]  Attending Physician: Marcelyn Bruins [id number]  Certification:: I certify this patient will need inpatient services for at least 2 midnights  Estimated Length of Stay: 3          B Medical/Surgery History Past Medical History:  Diagnosis Date   Altered mental status 04/19/2016   Depression    History of total hysterectomy    Hyperglycemia 04/19/2016   Intractable migraine without aura 12/04/2012   Migraine with intractable migraine    Restless leg syndrome    Past Surgical History:  Procedure Laterality Date   TOTAL ABDOMINAL HYSTERECTOMY       A IV  Location/Drains/Wounds Patient Lines/Drains/Airways Status     Active Line/Drains/Airways     Name Placement date Placement time Site Days   Peripheral IV 09/26/22 20 G Right Antecubital 09/26/22  0655  Antecubital  less than 1   Peripheral IV 09/26/22 20 G Left Antecubital 09/26/22  0704  Antecubital  less than 1            Intake/Output Last 24 hours  Intake/Output Summary (Last 24 hours) at 09/26/2022 1336 Last data filed at 09/26/2022 1154 Gross per 24 hour  Intake 2041.75 ml  Output --  Net 2041.75 ml    Labs/Imaging Results for orders placed or performed during the hospital encounter of 09/26/22 (from the past 48 hour(s))  Lactic acid, plasma     Status: Abnormal   Collection Time: 09/26/22  7:00 AM  Result Value Ref Range   Lactic Acid, Venous 2.0 (HH) 0.5 - 1.9 mmol/L    Comment: CRITICAL RESULT CALLED TO, READ BACK BY AND VERIFIED WITH S.Gwin Eagon RN @ [phone] 09/26/22 BY C.EDENS Performed at [facility], [address] [place], [place] [address]   Comprehensive metabolic panel     Status: Abnormal   Collection Time: 09/26/22  7:00 AM  Result Value Ref Range   Sodium 132 (L) 135 - 145 mmol/L   Potassium 3.7 3.5 - 5.1 mmol/L   Chloride 99 98 - 111 mmol/L   CO2 21 (L) 22 - 32 mmol/L   Glucose, Bld 159 (H) 70 -  99 mg/dL    Comment: Glucose reference range applies only to samples taken after fasting for at least 8 hours.   BUN 13 8 - 23 mg/dL   Creatinine, Ser 1.40 (H) 0.44 - 1.00 mg/dL   Calcium 8.6 (L) 8.9 - 10.3 mg/dL   Total Protein 6.4 (L) 6.5 - 8.1 g/dL   Albumin 3.4 (L) 3.5 - 5.0 g/dL   AST 57 (H) 15 - 41 U/L   ALT 31 0 - 44 U/L   Alkaline Phosphatase 110 38 - 126 U/L   Total Bilirubin 0.5 0.3 - 1.2 mg/dL   GFR, Estimated 42 (L) >60 mL/min    Comment: (NOTE) Calculated using the CKD-EPI Creatinine Equation (2021)    Anion gap 12 5 - 15    Comment: Performed at [facility], [place] [address], [place], [place] [address]  CBC with Differential      Status: Abnormal   Collection Time: 09/26/22  7:00 AM  Result Value Ref Range   WBC 13.1 (H) 4.0 - 10.5 K/uL   RBC 3.79 (L) 3.87 - 5.11 MIL/uL   Hemoglobin 10.8 (L) 12.0 - 15.0 g/dL   HCT 34.2 (L) 36.0 - 46.0 %   MCV 90.2 80.0 - 100.0 fL   MCH 28.5 26.0 - 34.0 pg   MCHC 31.6 30.0 - 36.0 g/dL   RDW 14.0 11.5 - 15.5 %   Platelets 348 150 - 400 K/uL   nRBC 0.0 0.0 - 0.2 %   Neutrophils Relative % 94 %   Neutro Abs 12.3 (H) 1.7 - 7.7 K/uL   Lymphocytes Relative 2 %   Lymphs Abs 0.3 (L) 0.7 - 4.0 K/uL   Monocytes Relative 3 %   Monocytes Absolute 0.4 0.1 - 1.0 K/uL   Eosinophils Relative 0 %   Eosinophils Absolute 0.0 0.0 - 0.5 K/uL   Basophils Relative 0 %   Basophils Absolute 0.0 0.0 - 0.1 K/uL   Immature Granulocytes 1 %   Abs Immature Granulocytes 0.07 0.00 - 0.07 K/uL    Comment: Performed at [facility], [place] [address], [place], [place] [address]  Protime-INR     Status: None   Collection Time: 09/26/22  7:00 AM  Result Value Ref Range   Prothrombin Time 15.0 11.4 - 15.2 seconds   INR 1.2 0.8 - 1.2    Comment: (NOTE) INR goal varies based on device and disease states. Performed at [facility], [place] [address], [place], [place] [address]   APTT     Status: Abnormal   Collection Time: 09/26/22  7:00 AM  Result Value Ref Range   aPTT 37 (H) 24 - 36 seconds    Comment:        IF BASELINE aPTT IS ELEVATED, SUGGEST PATIENT RISK ASSESSMENT BE USED TO DETERMINE APPROPRIATE ANTICOAGULANT THERAPY. Performed at [facility], [place] [address] [place], [place] [address]   Resp panel by RT-PCR (RSV, Flu A&B, Covid) Anterior Nasal Swab     Status: None   Collection Time: 09/26/22 10:14 AM   Specimen: Anterior Nasal Swab  Result Value Ref Range   SARS Coronavirus 2 by RT PCR NEGATIVE NEGATIVE   Influenza A by PCR NEGATIVE NEGATIVE   Influenza B by PCR NEGATIVE NEGATIVE    Comment: (NOTE) The Xpert Xpress SARS-CoV-2/FLU/RSV plus assay is intended as an aid in  the diagnosis of influenza from Nasopharyngeal swab specimens and should not be used as a sole basis  for treatment. Nasal washings and aspirates are unacceptable for Xpert Xpress SARS-CoV-2/FLU/RSV testing.  Fact Sheet for Patients: EntrepreneurPulse.com.au  Fact Sheet for Healthcare Providers: IncredibleEmployment.be  This test is not yet approved or cleared by the [place] FDA and has been authorized for detection and/or diagnosis of SARS-CoV-2 by FDA under an Emergency Use Authorization (EUA). This EUA will remain in effect (meaning this test can be used) for the duration of the COVID-19 declaration under Section 564(b)(1) of the Act, 21 U.S.C. section 360bbb-3(b)(1), unless the authorization is terminated or revoked.     Resp Syncytial Virus by PCR NEGATIVE NEGATIVE    Comment: (NOTE) Fact Sheet for Patients: EntrepreneurPulse.com.au  Fact Sheet for Healthcare Providers: IncredibleEmployment.be  This test is not yet approved or cleared by the [place] FDA and has been authorized for detection and/or diagnosis of SARS-CoV-2 by FDA under an Emergency Use Authorization (EUA). This EUA will remain in effect (meaning this test can be used) for the duration of the COVID-19 declaration under Section 564(b)(1) of the Act, 21 U.S.C. section 360bbb-3(b)(1), unless the authorization is terminated or revoked.  Performed at [facility], [place] [address], [place], [place] [address]   Lactic acid, plasma     Status: None   Collection Time: 09/26/22 10:16 AM  Result Value Ref Range   Lactic Acid, Venous 1.1 0.5 - 1.9 mmol/L    Comment: Performed at [facility] [address], [place], [place] [address]   CT Lumbar Spine Wo Contrast  Result Date: 09/26/2022 CLINICAL DATA:  Spine surgery.  Postop infection suspected EXAM: CT LUMBAR SPINE WITHOUT CONTRAST TECHNIQUE: Multidetector CT imaging of  the lumbar spine was performed without intravenous contrast administration. Multiplanar CT image reconstructions were also generated. RADIATION DOSE REDUCTION: This exam was performed according to the departmental dose-optimization program which includes automated exposure control, adjustment of the mA and/or kV according to patient size and/or use of iterative reconstruction technique. COMPARISON:  07/17/2021 FINDINGS: Segmentation: 5 lumbar type vertebrae. Alignment: Normal. Vertebrae: L4-5 right laminotomy. No endplate destruction or bone lesion seen. There is a nondisplaced fracture at the inferior articular process of L4 on the right. See sagittal reformats. Paraspinal and other soft tissues: Fat stranding in keeping with history of fairly recent surgery. There is anterior epidural soft tissue density at the level of L4-5 which is presumably residual disc, no gross collection but very limited beyond the subcutaneous tissues due to technique. Disc levels: Disc space narrowing with endplate degeneration primarily at L2-3 and below. No bony impingement. On soft tissue windows there is some degree of spinal stenosis at L3-4 and L4-5. IMPRESSION: 1. Changes of recent L4-5 right laminotomy. Nondisplaced L4 right inferior articular process fracture adjacent to the osteotomy. 2. No specific signs of osteomyelitis or infection. 3. Some degree of spinal stenosis at L3-4 and L4-5. Electronically Signed   By: Jorje Guild M.D.   On: 09/26/2022 10:37   DG Chest Port 1 View  Result Date: 09/26/2022 CLINICAL DATA:  Questionable sepsis EXAM: PORTABLE CHEST 1 VIEW COMPARISON:  06/08/2016 FINDINGS: Artifact from EKG leads. Normal heart size and mediastinal contours allowing for distortion due to rightward rotation. No acute infiltrate or edema. No effusion or pneumothorax. No acute osseous findings. IMPRESSION: No evidence of active disease. Electronically Signed   By: Jorje Guild M.D.   On: 09/26/2022 07:27     Pending Labs [employer] (From admission, onward)     Start     Ordered  09/27/22 0500  Protime-INR  Tomorrow morning,   R        09/26/22 1252   09/27/22 0500  Cortisol-am, blood  Tomorrow morning,   R        09/26/22 1252   09/27/22 0500  Procalcitonin  Tomorrow morning,   R       References:    Procalcitonin Lower Respiratory Tract Infection AND Sepsis Procalcitonin Algorithm   09/26/22 1252   09/27/22 0500  Comprehensive metabolic panel  Tomorrow morning,   R        09/26/22 1252   09/27/22 0500  CBC  Tomorrow morning,   R        09/26/22 1252   09/26/22 1243  Procalcitonin  Once,   R       References:    Procalcitonin Lower Respiratory Tract Infection AND Sepsis Procalcitonin Algorithm   09/26/22 1252   09/26/22 1239  HIV Antibody (routine testing w rflx)  (HIV Antibody (Routine testing w reflex) panel)  Once,   R        09/26/22 1252   09/26/22 0809  Aerobic/Anaerobic Culture w Gram Stain (surgical/deep wound)  Once,   URGENT        09/26/22 0809   09/26/22 0655  Urinalysis, w/ Reflex to Culture (Infection Suspected) -Urine, Clean Catch  (Undifferentiated presentation (screening labs and basic nursing orders))  Once,   URGENT       Question:  Specimen Source  Answer:  Urine, Clean Catch   09/26/22 0654   09/26/22 0654  Blood Culture (routine x 2)  (Undifferentiated presentation (screening labs and basic nursing orders))  BLOOD CULTURE X 2,   STAT      09/26/22 0654            Vitals/Pain Today's Vitals   09/26/22 1145 09/26/22 1205 09/26/22 1230 09/26/22 1245  BP: 102/72  102/63 (!) 108/57  Pulse: (!) 111  (!) 102 (!) 102  Resp: (!) 23  (!) 24 (!) 25  Temp:      TempSrc:      SpO2: 96%  93% 94%  Weight:      Height:      PainSc:  7       Isolation Precautions No active isolations  Medications Medications  lactated ringers infusion ( Intravenous Rate/Dose Change 09/26/22 1159)  lactated ringers infusion (has no administration in time range)  sodium  chloride flush (NS) 0.9 % injection 3 mL (has no administration in time range)  lactated ringers bolus 1,000 mL (has no administration in time range)  metroNIDAZOLE (FLAGYL) IVPB 500 mg (has no administration in time range)  polyethylene glycol (MIRALAX / GLYCOLAX) packet 17 g (has no administration in time range)  acetaminophen (TYLENOL) tablet 1,000 mg (has no administration in time range)  ibuprofen (ADVIL) tablet 600 mg (has no administration in time range)  ceFEPIme (MAXIPIME) 2 g in sodium chloride 0.9 % 100 mL IVPB (has no administration in time range)  vancomycin variable dose per unstable renal function (pharmacist dosing) (has no administration in time range)  midazolam (VERSED) injection 2 mg (2 mg Intramuscular Given 09/26/22 0700)  acetaminophen (TYLENOL) suppository 650 mg (650 mg Rectal Given 09/26/22 0726)  ceFEPIme (MAXIPIME) 2 g in sodium chloride 0.9 % 100 mL IVPB (0 g Intravenous Stopped 09/26/22 0815)  metroNIDAZOLE (FLAGYL) IVPB 500 mg (0 mg Intravenous Stopped 09/26/22 0919)  vancomycin (VANCOREADY) IVPB 1500 mg/300 mL (0 mg Intravenous Stopped 09/26/22 1128)  lactated ringers bolus  1,000 mL (0 mLs Intravenous Stopped 09/26/22 0950)  ketorolac (TORADOL) 15 MG/ML injection 15 mg (15 mg Intravenous Given 09/26/22 0827)  droperidol (INAPSINE) 2.5 MG/ML injection 1.25 mg (1.25 mg Intravenous Given 09/26/22 0935)  ampicillin (OMNIPEN) 2 g in sodium chloride 0.9 % 100 mL IVPB (0 g Intravenous Stopped 09/26/22 1155)  acetaminophen (TYLENOL) tablet 1,000 mg (1,000 mg Oral Given 09/26/22 1057)  prochlorperazine (COMPAZINE) injection 10 mg (10 mg Intravenous Given 09/26/22 1157)    Mobility walks     Focused Assessments Neuro Assessment Handoff:  Swallow screen pass? Yes  Cardiac Rhythm: Sinus tachycardia   Last date known well: 09/25/22 Last time known well: 2130 Neuro Assessment: Exceptions to WDL Neuro Checks:      Has TPA been given? No If patient is a Neuro Trauma and patient is  going to OR before floor call report to [place] nurse: [phone] or [phone]   R Recommendations: See Admitting Provider Note  Report given to:   Additional Notes:

## 2022-09-27 ENCOUNTER — Inpatient Hospital Stay (HOSPITAL_COMMUNITY): Payer: Medicare Other

## 2022-09-27 DIAGNOSIS — R7881 Bacteremia: Secondary | ICD-10-CM

## 2022-09-27 DIAGNOSIS — T8149XA Infection following a procedure, other surgical site, initial encounter: Secondary | ICD-10-CM | POA: Diagnosis not present

## 2022-09-27 DIAGNOSIS — A419 Sepsis, unspecified organism: Secondary | ICD-10-CM | POA: Diagnosis not present

## 2022-09-27 DIAGNOSIS — G9341 Metabolic encephalopathy: Secondary | ICD-10-CM | POA: Diagnosis not present

## 2022-09-27 DIAGNOSIS — B9561 Methicillin susceptible Staphylococcus aureus infection as the cause of diseases classified elsewhere: Secondary | ICD-10-CM

## 2022-09-27 DIAGNOSIS — N179 Acute kidney failure, unspecified: Secondary | ICD-10-CM | POA: Diagnosis not present

## 2022-09-27 DIAGNOSIS — T847XXA Infection and inflammatory reaction due to other internal orthopedic prosthetic devices, implants and grafts, initial encounter: Secondary | ICD-10-CM

## 2022-09-27 DIAGNOSIS — R6520 Severe sepsis without septic shock: Secondary | ICD-10-CM | POA: Diagnosis not present

## 2022-09-27 LAB — CSF CELL COUNT WITH DIFFERENTIAL
Eosinophils, CSF: 0 % (ref 0–1)
Lymphs, CSF: 4 % — ABNORMAL LOW (ref 40–80)
Monocyte-Macrophage-Spinal Fluid: 0 % — ABNORMAL LOW (ref 15–45)
RBC Count, CSF: 46000 /mm3 — ABNORMAL HIGH
Segmented Neutrophils-CSF: 96 % — ABNORMAL HIGH (ref 0–6)
Tube #: 1
WBC, CSF: 1237 /mm3 (ref 0–5)

## 2022-09-27 LAB — COMPREHENSIVE METABOLIC PANEL
ALT: 30 U/L (ref 0–44)
AST: 54 U/L — ABNORMAL HIGH (ref 15–41)
Albumin: 2.5 g/dL — ABNORMAL LOW (ref 3.5–5.0)
Alkaline Phosphatase: 95 U/L (ref 38–126)
Anion gap: 8 (ref 5–15)
BUN: 13 mg/dL (ref 8–23)
CO2: 23 mmol/L (ref 22–32)
Calcium: 8.4 mg/dL — ABNORMAL LOW (ref 8.9–10.3)
Chloride: 106 mmol/L (ref 98–111)
Creatinine, Ser: 1.06 mg/dL — ABNORMAL HIGH (ref 0.44–1.00)
GFR, Estimated: 59 mL/min — ABNORMAL LOW (ref >60)
Glucose, Bld: 112 mg/dL — ABNORMAL HIGH (ref 70–99)
Potassium: 4 mmol/L (ref 3.5–5.1)
Sodium: 137 mmol/L (ref 135–145)
Total Bilirubin: 0.5 mg/dL (ref 0.3–1.2)
Total Protein: 5.6 g/dL — ABNORMAL LOW (ref 6.5–8.1)

## 2022-09-27 LAB — MENINGITIS/ENCEPHALITIS PANEL (CSF)

## 2022-09-27 LAB — CBC
HCT: 29.8 % — ABNORMAL LOW (ref 36.0–46.0)
Hemoglobin: 9.9 g/dL — ABNORMAL LOW (ref 12.0–15.0)
MCH: 29.2 pg (ref 26.0–34.0)
MCHC: 33.2 g/dL (ref 30.0–36.0)
MCV: 87.9 fL (ref 80.0–100.0)
Platelets: 241 10*3/uL (ref 150–400)
RBC: 3.39 MIL/uL — ABNORMAL LOW (ref 3.87–5.11)
RDW: 14.5 % (ref 11.5–15.5)
WBC: 9.8 10*3/uL (ref 4.0–10.5)
nRBC: 0 % (ref 0.0–0.2)

## 2022-09-27 LAB — PROTIME-INR
INR: 1.3 — ABNORMAL HIGH (ref 0.8–1.2)
Prothrombin Time: 16.4 seconds — ABNORMAL HIGH (ref 11.4–15.2)

## 2022-09-27 LAB — CORTISOL-AM, BLOOD: Cortisol - AM: 10.4 ug/dL (ref 6.7–22.6)

## 2022-09-27 LAB — PROCALCITONIN: Procalcitonin: 4.34 ng/mL

## 2022-09-27 LAB — PROTEIN AND GLUCOSE, CSF
Glucose, CSF: 32 mg/dL — ABNORMAL LOW (ref 40–70)
Total  Protein, CSF: 432 mg/dL — ABNORMAL HIGH (ref 15–45)

## 2022-09-27 MED ORDER — DIAZEPAM 5 MG PO TABS
5.0000 mg | ORAL_TABLET | Freq: Four times a day (QID) | ORAL | Status: DC | PRN
  Administered 2022-09-27 – 2022-10-08 (×25): 5 mg via ORAL
  Filled 2022-09-27 (×27): qty 1

## 2022-09-27 MED ORDER — LIDOCAINE HCL (PF) 1 % IJ SOLN
5.0000 mL | Freq: Once | INTRAMUSCULAR | Status: AC
  Administered 2022-09-27: 3 mL via SUBCUTANEOUS

## 2022-09-27 MED ORDER — GABAPENTIN 300 MG PO CAPS: 300.0000 mg | ORAL_CAPSULE | Freq: Every day | ORAL | Status: DC

## 2022-09-27 MED ORDER — CLONAZEPAM 0.5 MG PO TABS: 1.0000 mg | ORAL_TABLET | Freq: Two times a day (BID) | ORAL | Status: DC | PRN

## 2022-09-27 MED ORDER — HYDROMORPHONE HCL 1 MG/ML IJ SOLN
0.5000 mg | INTRAMUSCULAR | Status: DC | PRN
  Administered 2022-09-27 – 2022-10-09 (×59): 0.5 mg via INTRAVENOUS
  Filled 2022-09-27 (×62): qty 0.5

## 2022-09-27 MED ORDER — SODIUM CHLORIDE 0.9 % IV SOLN
12.0000 g | INTRAVENOUS | Status: DC
  Administered 2022-09-27 – 2022-10-08 (×12): 12 g via INTRAVENOUS
  Filled 2022-09-27 (×15): qty 48

## 2022-09-27 MED ORDER — GABAPENTIN 300 MG PO CAPS
300.0000 mg | ORAL_CAPSULE | Freq: Three times a day (TID) | ORAL | Status: DC
  Administered 2022-09-27 – 2022-10-01 (×13): 300 mg via ORAL
  Filled 2022-09-27 (×13): qty 1

## 2022-09-27 MED ORDER — OXYCODONE HCL 5 MG PO TABS
5.0000 mg | ORAL_TABLET | Freq: Four times a day (QID) | ORAL | Status: DC | PRN
  Administered 2022-09-27 – 2022-09-28 (×3): 5 mg via ORAL
  Filled 2022-09-27 (×3): qty 1

## 2022-09-27 MED ORDER — PANTOPRAZOLE SODIUM 40 MG PO TBEC
40.0000 mg | DELAYED_RELEASE_TABLET | Freq: Every day | ORAL | Status: DC
  Administered 2022-09-27 – 2022-10-09 (×13): 40 mg via ORAL
  Filled 2022-09-27 (×13): qty 1

## 2022-09-27 NOTE — Progress Notes (Signed)
CRITICAL VALUE STICKER  CRITICAL VALUE: CSF WBC   RECEIVER (on-site recipient of call): Lillia Abed Mayla Biddy   DATE & TIME NOTIFIED: 09/27/2022 1837  MESSENGER (representative from lab): vang J  MD NOTIFIED:  Dr. Sarajane Jews   TIME OF NOTIFICATION: 1841  RESPONSE:  Md aware and acknowledged message

## 2022-09-27 NOTE — Hospital Course (Signed)
62 year old woman PMH L4-5 laminectomy microdiscectomy February 20, who presented with sudden acute encephalopathy 3/3 with fever up to 105.  Placed on broad-spectrum antibiotics admitted for further evaluation, neurosurgery consultation and infectious disease consultation.

## 2022-09-27 NOTE — Progress Notes (Signed)
  Progress Note   Patient: Amanda [name] [id number] DOB: May 14, 1961 DOA: 09/26/2022     1 DOS: the patient was seen and examined on 09/27/2022   Brief hospital course: 62 year old woman PMH L4-5 laminectomy microdiscectomy February 20, who presented with sudden acute encephalopathy 3/3 with fever up to 105.  Placed on broad-spectrum antibiotics admitted for further evaluation, neurosurgery consultation and infectious disease consultation.  Assessment and Plan: Severe sepsis Staph aureus bacteremia Acute metabolic encephalopathy Recent lumbar surgery Now afebrile and clinically much improved with just some slow speech but confusion resolved. Has chronic migraines which is similar. ID consultation for bacteremia.  Will pursue lumbar puncture.  CT head screen. Check Echo Continue empiric antibiotics.  No other signs or symptoms of focal infection.  Concern for meningitis. Incision site looks good but is leaking clear fluid.  Seen by neurosurgery yesterday, will discuss with primary surgeon Dr. Christella Noa   AKI, creatinine 1.4 on admission, now 1.06. AKI resolved.   Normocytic anemia Stable.  Patient reports this is chronic.  Depression Migraine RLS Holding home central acting medications in the setting of encephalopathy: Including venlafaxine, gabapentin      Subjective:  Feels much better, except for migraine Wants to go home Husband at bedside reports pt much better but having word finding difficulties  Physical Exam: Vitals:   09/26/22 2309 09/27/22 0420 09/27/22 0808 09/27/22 1103  BP: 121/68 130/77 129/80 135/82  Pulse: 98  99 95  Resp: '17 20 20 19  '$ Temp: 98.7 F (37.1 C) 98.7 F (37.1 C) 98.3 F (36.8 C) 98.4 F (36.9 C)  TempSrc: Oral Oral Oral Oral  SpO2: 98% 94% 95% 94%  Weight:      Height:       Physical Exam Vitals reviewed.  Constitutional:      General: She is not in acute distress.    Appearance: She is not ill-appearing or toxic-appearing.   Cardiovascular:     Rate and Rhythm: Normal rate and regular rhythm.     Heart sounds: No murmur heard. Pulmonary:     Effort: Pulmonary effort is normal. No respiratory distress.     Breath sounds: No wheezing, rhonchi or rales.  Musculoskeletal:     Right lower leg: No edema.     Left lower leg: No edema.  Neurological:     General: No focal deficit present.     Mental Status: She is alert.     Cranial Nerves: No cranial nerve deficit.     Motor: No weakness.     Coordination: Coordination normal.  Psychiatric:        Attention and Perception: Attention normal.        Behavior: Behavior is cooperative.        Thought Content: Thought content normal.        Cognition and Memory: Cognition normal.     Comments: Irritable, speech clear but with some difficulties expressing self with a few words     Data Reviewed: Creatinine 1.06, better Hgb 9.9 BC noted  Family Communication: husband, daughter at bedside  Disposition: Status is: Inpatient Remains inpatient appropriate because: bacteremia  Planned Discharge Destination: Home    Time spent: 40 minutes  Author: Murray Hodgkins, MD 09/27/2022 1:11 PM  For on call review [email or web address].

## 2022-09-27 NOTE — Consult Note (Addendum)
[name] for Infectious Disease    Date of Admission:  09/26/2022     Reason for Consult: MSSA bacteremia     Referring Physician: Automatic consult for MSSA  Current antibiotics: Nafcillin  ASSESSMENT:    62 y.o. female admitted with:  MSSA bacteremia: Likely secondary to recent surgery on 09/14/2022 where she underwent L4-5 microdiscectomy with reported drainage from incision prior to admission that has now improved on antibiotics.  CT lumbar spine without contrast did not show any obvious infection. Encephalopathy: Could be secondary to sepsis in the setting of her infection.  However, meningitis on the differential in the setting of her recent surgery. Acute kidney injury: Creatinine 1.4 at admission.  Improved this morning. Severe sepsis: Secondary to #1.  RECOMMENDATIONS:    Due to concern for possible CNS infection, will switch to nafcillin pending LP Agree with lumbar puncture to assess for meningitis Repeat blood cultures Transthoracic echocardiogram Agree with CT head.  Also discussed MRI brain to rule out CNS emboli, however, patient reports severe claustrophobia and prefers not to pursue this imaging modality at this time.  Will continue to monitor mental status Follow-up further neurosurgery recommendations and monitor wound incision Would have low threshold to obtain MRI of her spine Following   Principal Problem:   Severe sepsis (HCC) Active Problems:   Restless legs syndrome (RLS)   Other migraine without status migrainosus, not intractable   Low back pain   MEDICATIONS:    Scheduled Meds:  acetaminophen  1,000 mg Oral Q8H   sodium chloride flush  3 mL Intravenous Q12H   Continuous Infusions:  nafcillin 12 g in sodium chloride 0.9 % 500 mL continuous infusion     PRN Meds:.ibuprofen, methocarbamol, mouth rinse, polyethylene glycol  HPI:    Amanda [name] is a 62 y.o. female with recent outpatient micro discectomy for lumbar disc  disease completed on 09/14/2022 with Dr. Christella [name].  Her postoperative course has been relatively unremarkable.  She reports increasing her activity late last week on Thursday.  They subsequently noticed some increased drainage from her incision and over the weekend she became altered and combative per her family.  She was brought to the emergency department where there was concern for severe sepsis in the setting of a temperature of 105 F.  She was started on broad-spectrum antibiotics and cultures were obtained.  She had a CT of the lumbar spine which showed recent changes of her surgery but no specific signs of infection.  She was seen by neurosurgery at which time there was no active drainage.  However, patient's husband reports this afternoon that over the weekend the bedding was saturated that her nurse thought maybe the patient had urinated herself, but it turned out to be saturated due to drainage from the incision.  He reports that this is subsequently improved over the last 24 hours.  They report her mental status has also improved.  She has some word finding difficulties at baseline which is seemingly worse at the moment but improved as well since starting on antibiotics.   Past Medical History:  Diagnosis Date   Altered mental status 04/19/2016   Depression    History of total hysterectomy    Hyperglycemia 04/19/2016   Intractable migraine without aura 12/04/2012   Migraine with intractable migraine    Restless leg syndrome     Social History   Tobacco Use   Smoking status: Never   Smokeless tobacco: Never  Substance Use Topics  Alcohol use: Yes    Alcohol/week: 1.0 standard drink of alcohol    Types: 1 Glasses of wine per week    Comment: one a year   Drug use: No    Family History  Problem Relation Age of Onset   Dementia Mother    Lung cancer Father    Migraines Sister    Migraines Sister    Migraines Brother    Hypothyroidism Sister    Hypothyroidism Sister     Hypothyroidism Sister     Allergies  Allergen Reactions   Wellbutrin [Bupropion]     GERD   Aspirin Other (See Comments)    Makes her ears ring    Review of Systems  All other systems reviewed and are negative.   OBJECTIVE:   Blood pressure 135/82, pulse 95, temperature 98.4 F (36.9 C), temperature source Oral, resp. rate 19, height '5\' 8"'$  (1.727 m), weight 80.7 kg, SpO2 94 %. Body mass index is 27.06 kg/m.  Physical Exam Constitutional:      General: She is not in acute distress.    Appearance: Normal appearance.  HENT:     Head: Normocephalic and atraumatic.  Eyes:     Extraocular Movements: Extraocular movements intact.     Conjunctiva/sclera: Conjunctivae normal.  Pulmonary:     Effort: Pulmonary effort is normal. No respiratory distress.  Abdominal:     General: There is no distension.     Palpations: Abdomen is soft.  Musculoskeletal:        General: Normal range of motion.     Cervical back: Normal range of motion and neck supple. No rigidity.  Skin:    General: Skin is warm and dry.  Neurological:     General: No focal deficit present.     Mental Status: She is alert and oriented to person, place, and time.     Cranial Nerves: No cranial nerve deficit.     Comments: She is alert and oriented.  She is conversant.  She does have some trouble with word finding at times.  Psychiatric:        Mood and Affect: Mood normal.        Behavior: Behavior normal.      Lab Results: Lab Results  Component Value Date   WBC 9.8 09/27/2022   HGB 9.9 (L) 09/27/2022   HCT 29.8 (L) 09/27/2022   MCV 87.9 09/27/2022   PLT 241 09/27/2022    Lab Results  Component Value Date   NA 137 09/27/2022   K 4.0 09/27/2022   CO2 23 09/27/2022   GLUCOSE 112 (H) 09/27/2022   BUN 13 09/27/2022   CREATININE 1.06 (H) 09/27/2022   CALCIUM 8.4 (L) 09/27/2022   GFRNONAA 59 (L) 09/27/2022   GFRAA >60 05/25/2016    Lab Results  Component Value Date   ALT 30 09/27/2022   AST 54  (H) 09/27/2022   ALKPHOS 95 09/27/2022   BILITOT 0.5 09/27/2022    No results found for: "CRP"  No results found for: "ESRSEDRATE"  I have reviewed the micro and lab results in Epic.  Imaging: CT Lumbar Spine Wo Contrast  Result Date: 09/26/2022 CLINICAL DATA:  Spine surgery.  Postop infection suspected EXAM: CT LUMBAR SPINE WITHOUT CONTRAST TECHNIQUE: Multidetector CT imaging of the lumbar spine was performed without intravenous contrast administration. Multiplanar CT image reconstructions were also generated. RADIATION DOSE REDUCTION: This exam was performed according to the departmental dose-optimization program which includes automated exposure control, adjustment of the mA  and/or kV according to patient size and/or use of iterative reconstruction technique. COMPARISON:  07/17/2021 FINDINGS: Segmentation: 5 lumbar type vertebrae. Alignment: Normal. Vertebrae: L4-5 right laminotomy. No endplate destruction or bone lesion seen. There is a nondisplaced fracture at the inferior articular process of L4 on the right. See sagittal reformats. Paraspinal and other soft tissues: Fat stranding in keeping with history of fairly recent surgery. There is anterior epidural soft tissue density at the level of L4-5 which is presumably residual disc, no gross collection but very limited beyond the subcutaneous tissues due to technique. Disc levels: Disc space narrowing with endplate degeneration primarily at L2-3 and below. No bony impingement. On soft tissue windows there is some degree of spinal stenosis at L3-4 and L4-5. IMPRESSION: 1. Changes of recent L4-5 right laminotomy. Nondisplaced L4 right inferior articular process fracture adjacent to the osteotomy. 2. No specific signs of osteomyelitis or infection. 3. Some degree of spinal stenosis at L3-4 and L4-5. Electronically Signed   By: Jorje Guild M.D.   On: 09/26/2022 10:37   DG Chest Port 1 View  Result Date: 09/26/2022 CLINICAL DATA:  Questionable  sepsis EXAM: PORTABLE CHEST 1 VIEW COMPARISON:  06/08/2016 FINDINGS: Artifact from EKG leads. Normal heart size and mediastinal contours allowing for distortion due to rightward rotation. No acute infiltrate or edema. No effusion or pneumothorax. No acute osseous findings. IMPRESSION: No evidence of active disease. Electronically Signed   By: Jorje Guild M.D.   On: 09/26/2022 07:27     Imaging independently reviewed in Epic.  Raynelle Highland for Infectious Disease [facility] [phone] pager 09/27/2022, 1:00 PM

## 2022-09-27 NOTE — Progress Notes (Signed)
Patient ID: Amanda [name], female   DOB: [birth year], 62 y.o.   MRN: [id number] BP 131/79 (BP Location: Right Arm)   Pulse 92   Temp 98.4 F (36.9 C) (Oral)   Resp 16   Ht '5\' 8"'$  (1.727 m)   Wt 80.7 kg   SpO2 99%   BMI 27.06 kg/m  Alert, word finding problems, speech is clear. She is fluent Moving all extremities Wound continues to drain from most superior aspect of wound. CSF findings noted. But the LP was not a clean tap. And only one tube sent, opening pressure not noted either.  Believe mri lumbar is necessary, discussed with [name] and she is in agreement. Will use valium for muscle spasms and sedation.  She clearly has improved corresponding with antibiotic institution.  Blood growing staph aureus.

## 2022-09-28 ENCOUNTER — Inpatient Hospital Stay (HOSPITAL_COMMUNITY): Payer: Medicare Other

## 2022-09-28 DIAGNOSIS — N179 Acute kidney failure, unspecified: Secondary | ICD-10-CM | POA: Diagnosis not present

## 2022-09-28 DIAGNOSIS — B9561 Methicillin susceptible Staphylococcus aureus infection as the cause of diseases classified elsewhere: Secondary | ICD-10-CM | POA: Diagnosis not present

## 2022-09-28 DIAGNOSIS — G003 Staphylococcal meningitis: Secondary | ICD-10-CM

## 2022-09-28 DIAGNOSIS — A419 Sepsis, unspecified organism: Secondary | ICD-10-CM | POA: Diagnosis not present

## 2022-09-28 DIAGNOSIS — R6520 Severe sepsis without septic shock: Secondary | ICD-10-CM | POA: Diagnosis not present

## 2022-09-28 DIAGNOSIS — R7881 Bacteremia: Secondary | ICD-10-CM

## 2022-09-28 DIAGNOSIS — T8149XA Infection following a procedure, other surgical site, initial encounter: Secondary | ICD-10-CM | POA: Diagnosis not present

## 2022-09-28 LAB — ECHOCARDIOGRAM COMPLETE
AR max vel: 3.18 cm2
AV Area VTI: 3.04 cm2
AV Area mean vel: 2.96 cm2
AV Mean grad: 4 mmHg
AV Peak grad: 6.8 mmHg
Ao pk vel: 1.3 m/s
Area-P 1/2: 3.48 cm2
Height: 68 in
S' Lateral: 2.8 cm
Weight: 2848 oz

## 2022-09-28 LAB — HSV 1/2 PCR, CSF
HSV-1 DNA: NEGATIVE
HSV-2 DNA: NEGATIVE

## 2022-09-28 MED ORDER — GADOBUTROL 1 MMOL/ML IV SOLN
7.5000 mL | Freq: Once | INTRAVENOUS | Status: AC | PRN
  Administered 2022-09-28: 7.5 mL via INTRAVENOUS

## 2022-09-28 MED ORDER — TRAZODONE HCL 50 MG PO TABS
50.0000 mg | ORAL_TABLET | Freq: Every evening | ORAL | Status: DC | PRN
  Administered 2022-09-29 – 2022-10-08 (×10): 50 mg via ORAL
  Filled 2022-09-28 (×10): qty 1

## 2022-09-28 MED ORDER — OXYCODONE HCL 5 MG PO TABS
5.0000 mg | ORAL_TABLET | ORAL | Status: DC | PRN
  Administered 2022-09-28 – 2022-10-02 (×16): 5 mg via ORAL
  Filled 2022-09-28 (×17): qty 1

## 2022-09-28 NOTE — Progress Notes (Signed)
Echocardiogram 2D Echocardiogram has been performed.  Ronny Flurry 09/28/2022, 1:13 PM

## 2022-09-28 NOTE — Progress Notes (Signed)
Pt given 10 mg of valium prior to MRI per order directions

## 2022-09-28 NOTE — Progress Notes (Signed)
Patient ID: Amanda [name], female   DOB: Apr 21, 1961, 62 y.o.   MRN: [id number] BP 117/74 (BP Location: Left Arm)   Pulse 73   Temp 98.4 F (36.9 C) (Oral)   Resp 18   Ht '5\' 8"'$  (1.727 m)   Wt 80.7 kg   SpO2 96%   BMI 27.06 kg/m  MRI reveals no evidence of a postoperative wound infection, discitis, or osteomyelitis. Findings on exam supported by bacteremia alone. Not at all sure a repeat LP is not warranted for true assessment of meningitis.  I will close superior portion of wound today.  If you need to contact me it would seem easier to call my office, but if Dr. Hulen Skains is your preferred method so be it.

## 2022-09-28 NOTE — Progress Notes (Signed)
  Progress Note   Patient: Amanda [name] [id number] DOB: Jan 21, 1961 DOA: 09/26/2022     2 DOS: the patient was seen and examined on 09/28/2022   Brief hospital course: 62 year old woman PMH L4-5 laminectomy microdiscectomy February 20, who presented with sudden acute encephalopathy 3/3 with fever up to 105.  Placed on broad-spectrum antibiotics admitted for further evaluation, neurosurgery consultation and infectious disease consultation.  Found to have MSSA bacteremia and bacterial meningitis.  Assessment and Plan: Severe sepsis Staph aureus bacteremia Bacterial meningitis Acute metabolic encephalopathy Recent lumbar surgery LP consistent with meningitis Continue nafcillin per ID MR showed subdural fluid collection -- management per Dr. Christella Noa Remains afebrile and clinically much improved again today. Encephalopathy resolved.  Follow-up echo   AKI, creatinine 1.4 on admission, now 1.06. AKI resolved.   Normocytic anemia Stable.  Patient reports this is chronic.   Depression Migraine RLS Holding home central acting medications in the setting of encephalopathy: Including venlafaxine, gabapentin     Subjective:  Feels much better Still having leg spasms Speech easier  Physical Exam: Vitals:   09/27/22 1930 09/27/22 2305 09/28/22 0300 09/28/22 0734  BP: 131/79 115/68 126/71 117/74  Pulse: 92 84 72 73  Resp: '16 12 15 18  '$ Temp: 98.4 F (36.9 C) 98.4 F (36.9 C) 98.4 F (36.9 C) 98.4 F (36.9 C)  TempSrc: Oral Oral Oral Oral  SpO2: 99% 93% 95% 96%  Weight:      Height:       Physical Exam Vitals reviewed.  Constitutional:      General: She is not in acute distress.    Appearance: She is not ill-appearing or toxic-appearing.  Cardiovascular:     Rate and Rhythm: Normal rate and regular rhythm.     Heart sounds: No murmur heard. Pulmonary:     Effort: Pulmonary effort is normal. No respiratory distress.     Breath sounds: No wheezing, rhonchi or rales.   Neurological:     General: No focal deficit present.     Mental Status: She is alert.     Cranial Nerves: No cranial nerve deficit.     Motor: No weakness.     Coordination: Coordination normal.  Psychiatric:        Mood and Affect: Mood normal.        Behavior: Behavior normal.     Data Reviewed: CSF studies noted  Family Communication: husband at bedside  Disposition: Status is: Inpatient Remains inpatient appropriate because: meningitis  Planned Discharge Destination: Home    Time spent: 25 minutes  Author: Murray Hodgkins, MD 09/28/2022 2:17 PM  For on call review [email or web address].

## 2022-09-28 NOTE — Progress Notes (Signed)
[facility] for Infectious Disease  Date of Admission:  09/26/2022           Reason for visit: Follow up on MSSA bacteremia and meningitis  Current antibiotics: Nafcillin  ASSESSMENT:    62 y.o. female admitted with:  MSSA bacteremia with meningitis: Suspect secondary to recent surgery on 09/14/2022 where she underwent L4-5 microdiscectomy with subsequent drainage from her incision prior to admission.  LP completed yesterday shows CSF pleocytosis (even when accounting for RBCs) with GPC's noted on Gram stain.  MRI this morning shows a dorsal subdural fluid collection extending from L5 through at least T12 superiorly.  Repeat blood cultures obtained this morning and pending. Encephalopathy: Appears improved. Acute kidney injury: Creatinine improved.  RECOMMENDATIONS:    Continue nafcillin given the presence of meningitis Follow-up TTE Follow-up repeat blood cultures Follow-up neurosurgery recommendations on her MRI findings Will follow   Principal Problem:   MSSA bacteremia Active Problems:   Restless legs syndrome (RLS)   Other migraine without status migrainosus, not intractable   Low back pain   Severe sepsis (HCC)   Acute kidney injury ([place])   Surgical site infection   Sepsis with encephalopathy without septic shock (HCC)    MEDICATIONS:    Scheduled Meds:  acetaminophen  1,000 mg Oral Q8H   gabapentin  300 mg Oral TID   pantoprazole  40 mg Oral Daily   sodium chloride flush  3 mL Intravenous Q12H   Continuous Infusions:  nafcillin 12 g in sodium chloride 0.9 % 500 mL continuous infusion 12 g (09/27/22 1348)   PRN Meds:.diazepam, HYDROmorphone (DILAUDID) injection, ibuprofen, mouth rinse, oxyCODONE, polyethylene glycol  SUBJECTIVE:   24 hour events:  Status post LP consistent with bacterial meningitis and Gram stain showing GPC's Tmax 98.4 Repeat blood cultures obtained today Seen by neurosurgery.  MRI spine recommended which shows a dorsal  subdural fluid collection from T5 through at least T12 TTE is pending  Patient reports feeling better this morning.  She reports headache.  She tolerated the MRI this morning.  Review of Systems  All other systems reviewed and are negative.     OBJECTIVE:   Blood pressure 117/74, pulse 73, temperature 98.4 F (36.9 C), temperature source Oral, resp. rate 18, height '5\' 8"'$  (1.727 m), weight 80.7 kg, SpO2 96 %. Body mass index is 27.06 kg/m.  Physical Exam Constitutional:      Appearance: Normal appearance.  HENT:     Head: Normocephalic and atraumatic.  Eyes:     Extraocular Movements: Extraocular movements intact.     Conjunctiva/sclera: Conjunctivae normal.  Pulmonary:     Effort: Pulmonary effort is normal. No respiratory distress.  Abdominal:     General: There is no distension.     Palpations: Abdomen is soft.  Musculoskeletal:     Cervical back: No rigidity.     Comments: Lumbar incision with some mild drainage on the bandage.  There is no significant warmth or erythema of the skin surrounding her incision  Skin:    General: Skin is warm and dry.  Neurological:     General: No focal deficit present.     Mental Status: She is alert and oriented to person, place, and time.     Comments: Her word finding capabilities seem better today  Psychiatric:        Mood and Affect: Mood normal.        Behavior: Behavior normal.      Lab Results: Lab  Results  Component Value Date   WBC 9.8 09/27/2022   HGB 9.9 (L) 09/27/2022   HCT 29.8 (L) 09/27/2022   MCV 87.9 09/27/2022   PLT 241 09/27/2022    Lab Results  Component Value Date   NA 137 09/27/2022   K 4.0 09/27/2022   CO2 23 09/27/2022   GLUCOSE 112 (H) 09/27/2022   BUN 13 09/27/2022   CREATININE 1.06 (H) 09/27/2022   CALCIUM 8.4 (L) 09/27/2022   GFRNONAA 59 (L) 09/27/2022   GFRAA >60 05/25/2016    Lab Results  Component Value Date   ALT 30 09/27/2022   AST 54 (H) 09/27/2022   ALKPHOS 95 09/27/2022    BILITOT 0.5 09/27/2022    No results found for: "CRP"  No results found for: "ESRSEDRATE"   I have reviewed the micro and lab results in Epic.  Imaging: MR Lumbar Spine W Wo Contrast  Result Date: 09/28/2022 CLINICAL DATA:  Wound infection. EXAM: MRI LUMBAR SPINE WITHOUT AND WITH CONTRAST TECHNIQUE: Multiplanar and multiecho pulse sequences of the lumbar spine were obtained without and with intravenous contrast. CONTRAST:  7.78m GADAVIST GADOBUTROL 1 MMOL/ML IV SOLN COMPARISON:  Lumbar spine CT 09/26/2022. Lumbar spine MRI 08/27/2022. FINDINGS: Segmentation: Conventional numbering is assumed with 5 non-rib-bearing, lumbar type vertebral bodies. Alignment:  Normal. Vertebrae: Interval postoperative changes right L4-5 hemilaminotomy and discectomy. Dorsal subdural fluid collection extending from L5 through at least T12 superiorly with few foci of susceptibility, likely representing gas at L2-3, possibly due to instrumentation for the previous day's image guided lumbar puncture. Other than the foci of susceptibility, this collection predominantly demonstrates fluid signal. This may represent hematoma versus empyema. Conus medullaris and cauda equina: Conus extends to the T12 level. Conus and cauda equina appear normal. Paraspinal and other soft tissues: Expected postoperative changes along the right aspect of the L4 spinous process and L4-5 interspace. Disc levels: The above mentioned subdural collection results in mild to moderate compression of the cauda equina at multiple levels. IMPRESSION: 1. Predominantly fluid signal, dorsal subdural fluid collection extending from L5 through at least T12 superiorly. This may represent hematoma versus empyema. 2. The subdural collection results in mild to moderate compression of the cauda equina at multiple levels. Electronically Signed   By: WEmmit AlexandersM.D.   On: 09/28/2022 09:50   DG FL GUIDED LUMBAR PUNCTURE  Result Date: 09/27/2022 CLINICAL DATA:   62year old female with history of recent lumbar surgery admitted with altered mental status and bacteremia. Lumbar puncture requested to evaluate for possible CNS infection. EXAM: DIAGNOSTIC LUMBAR PUNCTURE UNDER FLUOROSCOPIC GUIDANCE COMPARISON:  None Available. FLUOROSCOPY: Radiation Exposure Index (as provided by the fluoroscopic device): 37.4 mGy Kerma PROCEDURE: Informed consent was obtained from the patient prior to the procedure, including potential complications of headache, allergy, and pain. With the patient prone, the lower back was prepped with Betadine. 1% Lidocaine was used for local anesthesia. Lumbar puncture was initially attempted at L1-2 via a midline approach using a 3.5 inch 22 gauge needle with return of blood into the needle hub but no CSF despite needle repositioning and reverse Trendelenburg positioning. Puncture was then attempted at T12-L1 with the same results. Puncture was then performed at L2-3 via a right interlaminar approach with return of bloody CSF which partially cleared. CSF flow was very slow despite reverse Trendelenburg positioning and slight needle adjustment. 3.5 mL of CSF were obtained for laboratory studies. The patient tolerated the procedure well and there were no apparent complications. This exam was performed  by Rushie Nyhan, NP, and was supervised and interpreted by Logan Bores, MD. IMPRESSION: Technically successful fluoroscopically guided lumbar puncture. Only a small amount of blood tinged CSF could be obtained as detailed above. Electronically Signed   By: Logan Bores M.D.   On: 09/27/2022 16:55   CT HEAD WO CONTRAST (5MM)  Result Date: 09/27/2022 CLINICAL DATA:  Bacteremia. Recent lumbar surgery. Assessment prior to planned lumbar puncture. EXAM: CT HEAD WITHOUT CONTRAST TECHNIQUE: Contiguous axial images were obtained from the base of the skull through the vertex without intravenous contrast. RADIATION DOSE REDUCTION: This exam was performed according  to the departmental dose-optimization program which includes automated exposure control, adjustment of the mA and/or kV according to patient size and/or use of iterative reconstruction technique. COMPARISON:  Head CT 04/18/2016 and MRI 04/19/2016 FINDINGS: Brain: There is no evidence of an acute infarct, intracranial hemorrhage, mass, midline shift, or extra-axial fluid collection. The ventricles and sulci are normal. Vascular: Calcified atherosclerosis at the skull base. No hyperdense vessel. Skull: No acute fracture or suspicious osseous lesion. Sinuses/Orbits: Visualized paranasal sinuses and mastoid air cells are clear. Unremarkable orbits. Other: None. IMPRESSION: Unremarkable CT appearance of the brain. Electronically Signed   By: Logan Bores M.D.   On: 09/27/2022 14:41   CT Lumbar Spine Wo Contrast  Result Date: 09/26/2022 CLINICAL DATA:  Spine surgery.  Postop infection suspected EXAM: CT LUMBAR SPINE WITHOUT CONTRAST TECHNIQUE: Multidetector CT imaging of the lumbar spine was performed without intravenous contrast administration. Multiplanar CT image reconstructions were also generated. RADIATION DOSE REDUCTION: This exam was performed according to the departmental dose-optimization program which includes automated exposure control, adjustment of the mA and/or kV according to patient size and/or use of iterative reconstruction technique. COMPARISON:  07/17/2021 FINDINGS: Segmentation: 5 lumbar type vertebrae. Alignment: Normal. Vertebrae: L4-5 right laminotomy. No endplate destruction or bone lesion seen. There is a nondisplaced fracture at the inferior articular process of L4 on the right. See sagittal reformats. Paraspinal and other soft tissues: Fat stranding in keeping with history of fairly recent surgery. There is anterior epidural soft tissue density at the level of L4-5 which is presumably residual disc, no gross collection but very limited beyond the subcutaneous tissues due to technique. Disc  levels: Disc space narrowing with endplate degeneration primarily at L2-3 and below. No bony impingement. On soft tissue windows there is some degree of spinal stenosis at L3-4 and L4-5. IMPRESSION: 1. Changes of recent L4-5 right laminotomy. Nondisplaced L4 right inferior articular process fracture adjacent to the osteotomy. 2. No specific signs of osteomyelitis or infection. 3. Some degree of spinal stenosis at L3-4 and L4-5. Electronically Signed   By: Jorje Guild M.D.   On: 09/26/2022 10:37     Imaging independently reviewed in Epic.    Raynelle Highland for Infectious Disease [facility] [phone] pager 09/28/2022, 10:12 AM

## 2022-09-29 ENCOUNTER — Inpatient Hospital Stay (HOSPITAL_COMMUNITY): Payer: Medicare Other

## 2022-09-29 DIAGNOSIS — A419 Sepsis, unspecified organism: Secondary | ICD-10-CM | POA: Diagnosis not present

## 2022-09-29 DIAGNOSIS — R6520 Severe sepsis without septic shock: Secondary | ICD-10-CM | POA: Diagnosis not present

## 2022-09-29 DIAGNOSIS — G003 Staphylococcal meningitis: Secondary | ICD-10-CM | POA: Diagnosis not present

## 2022-09-29 DIAGNOSIS — B9561 Methicillin susceptible Staphylococcus aureus infection as the cause of diseases classified elsewhere: Secondary | ICD-10-CM | POA: Diagnosis not present

## 2022-09-29 LAB — CULTURE, BLOOD (ROUTINE X 2)
Special Requests: ADEQUATE
Special Requests: ADEQUATE

## 2022-09-29 LAB — PATHOLOGIST SMEAR REVIEW

## 2022-09-29 MED ORDER — GADOBUTROL 1 MMOL/ML IV SOLN
7.5000 mL | Freq: Once | INTRAVENOUS | Status: AC | PRN
  Administered 2022-09-29: 7.5 mL via INTRAVENOUS

## 2022-09-29 MED ORDER — ONDANSETRON HCL 4 MG/2ML IJ SOLN
4.0000 mg | Freq: Four times a day (QID) | INTRAMUSCULAR | Status: DC | PRN
  Administered 2022-09-29 – 2022-10-09 (×11): 4 mg via INTRAVENOUS
  Filled 2022-09-29 (×12): qty 2

## 2022-09-29 MED ORDER — LACTATED RINGERS IV SOLN: INTRAVENOUS | Status: DC

## 2022-09-29 NOTE — Progress Notes (Signed)
Patient ID: Amanda [name], female   DOB: 06-01-1961, 62 y.o.   MRN: [id number] BP 130/84 (BP Location: Left Arm)   Pulse 83   Temp 98.6 F (37 C) (Oral)   Resp 20   Ht '5\' 8"'$  (1.727 m)   Wt 80.7 kg   SpO2 98%   BMI 27.06 kg/m  Alert and oriented x 4, speech is clear and fluent Moving all extremities well Wound is not draining, dressing from last night's closure is only blood stained. Back is flat. No evidence of csf leak outside of headache. She has been in bed and flat for the majority of the day.  Will review mri scan when finished.

## 2022-09-29 NOTE — Progress Notes (Signed)
Patient ID: Amanda [name], female   DOB: 1961/06/16, 62 y.o.   MRN: [id number] BP 123/80 (BP Location: Left Arm)   Pulse 84   Temp 98.2 F (36.8 C) (Oral)   Resp 12   Ht '5\' 8"'$  (1.727 m)   Wt 80.7 kg   SpO2 96%   BMI 27.06 kg/m  I have read Dr. Alcario Drought note and respectfully disagree. A subdural infection in the lumbar spine is exceedingly rare. There was absolutely no evidence two days ago about a csf leak on the MRI. That does not preclude its possibility but certainly makes it less likely. The radiologist Dr. Barbie Banner and I discussed this, he and I believe the repeated attempts for the LP and subsequent bloody tap give credence for this being hematoma. While certainly bacteremia can go anywhere it wants, the usual progression is discitis, post op infection leading to a bacteremia. This case is unusual, but further imaging for a leak is not warranted given my reticence in performing operative repair in the setting of bacteremia at this time. A myelogram should not be done given the higher risk of spreading the infection. Certainly the subdural blood is an irritant to the nerve roots and spasms can be caused by this. The spasms need only be treated symptomatically. This is the reason I placed her on valium.

## 2022-09-29 NOTE — Progress Notes (Addendum)
PROGRESS NOTE   Amanda [name]  [id number]    DOB: 1960-09-18    DOA: 09/26/2022  PCP: Carol Ada, MD   I have briefly reviewed patients previous medical records in [facility].  Chief Complaint  Patient presents with   Altered Mental Status   Claudication    Brief Narrative:  62 year old married female, s/p right L4-5 laminotomy and microdiscectomy at the outpatient surgical center on 09/14/2022, unremarkable postop course, developed acute onset of confusion, cramping in her right leg, restlessness and combativeness and brought to the ED on 09/26/2022 where she was noted to have a fever of 105 F.  She was admitted for severe sepsis, acute encephalopathy and acute kidney injury.  Since then she is diagnosed with MSSA bacteremia, suspected bacterial meningitis.  ID and neurosurgery consulted and following.  Ongoing severe headaches.   Assessment & Plan:  Principal Problem:   MSSA bacteremia Active Problems:   Restless legs syndrome (RLS)   Other migraine without status migrainosus, not intractable   Low back pain   Severe sepsis (HCC)   Acute kidney injury [facility])   Surgical site infection   Sepsis with encephalopathy without septic shock (HCC)   Meningitis, staphylococcal   Severe sepsis, secondary to MSSA bacteremia in the setting of recent L4-5 microdiscectomy (09/14/2022) with suspected bacterial meningitis, all POA: Surveillance blood cultures from 3/5: Negative to date.  Sepsis physiology resolved.  ID following.  MSSA bacteremia: ID follow-up appreciated.  Surgical wound infection as a potential source for bacteremia.  Blood cultures from 09/26/2022 positive for MSSA.  Surveillance blood cultures from 09/28/2022: Negative to date.  MRI L-spine 3/5 showed subdural fluid collection from L5 through at least T12.  As per radiology and neurosurgery, they feel this is a hematoma.  Continue IV nafcillin.  TTE showed normal EF, no evidence of vegetations.  Arranged TEE with  cardiology for 3/8  Suspected bacterial meningitis: Difficult and traumatic lumbar puncture.  CSF growing Staph aureus.  Although ID felt that her L-spine subdural fluid collection on MRI could represent an empyema or infected hematoma, neurosurgery disagrees and indicated a subdural infection in the lumbar spine is exceedingly rare.  Continue IV nafcillin.  Septic encephalopathy: Secondary to above.  Clinically improved and may have resolved.  Severe headache: Probably multifactorial related to acute bacterial meningitis, per patient report worsened after the LP and hence CSF leak is a potential.  However per neurosurgery, no evidence of CSF leak.  As communicated with ID, checking MRI brain with and without contrast.  Acute kidney injury Creatinine had peaked to 1.4.  Resolved.  Follow BMP in AM.  Normocytic anemia: Reportedly chronic.  Follow CBC periodically.  Depression, migraine, RLS: Holding home centrally acting medications in the setting of encephalopathy including venlafaxine.  Gabapentin has been resumed.  Body mass index is 27.06 kg/m.    DVT prophylaxis: SCDs Start: 09/26/22 1259     Code Status: Full Code:  ACP Documents: None present Family Communication: Spouse and daughter at bedside Disposition:  Status is: Inpatient Remains inpatient appropriate because: Ongoing severe headache, opioid dependent.  IV antibiotics.  Needs TEE.     Consultants:   Infectious disease Neurosurgery   Procedures:   Lumbar puncture  Antimicrobials:   As noted above   Subjective:  Headache slightly better than 2 days ago.  Feels that the headache started after difficult lumbar puncture.  States that initially the headache was 20/10 in severity and now down to 10/10 in severity which improves  with opioid pain meds to 6/10.  Getting almost around-the-clock IV/oral opioids.  No BM since Saturday last, passing flatus but declines bowel regimen.  Wants to try 1 more day.  Nonbloody  emesis x 1 this morning.  Objective:   Vitals:   09/29/22 0249 09/29/22 0420 09/29/22 0806 09/29/22 1133  BP: 137/81 137/85 123/80 130/84  Pulse: 84 84 84 83  Resp: '16 13 12 20  '$ Temp: 98.2 F (36.8 C) 98.2 F (36.8 C) 98.2 F (36.8 C) 98.6 F (37 C)  TempSrc: Oral Oral Oral Oral  SpO2: 100% 96% 96% 98%  Weight:      Height:        General exam: Middle-age female, moderately built and nourished lying comfortably supine in bed without distress.  She did not appear in any pain when I saw her. Respiratory system: Clear to auscultation. Respiratory effort normal. Cardiovascular system: S1 & S2 heard, RRR. No JVD, murmurs, rubs, gallops or clicks. No pedal edema.  Telemetry personally reviewed: Sinus rhythm. Gastrointestinal system: Abdomen is nondistended, soft and nontender. No organomegaly or masses felt. Normal bowel sounds heard. Central nervous system: Alert and oriented. No focal neurological deficits. Extremities: Symmetric 5 x 5 power. Skin: No rashes, lesions or ulcers.  Lumbar surgical site examined along with spouse, dressing mildly soaked with dried blood but otherwise no acute findings. Psychiatry: Judgement and insight appear normal. Mood & affect appropriate.     Data Reviewed:   I have personally reviewed following labs and imaging studies   CBC: Recent Labs  Lab 09/26/22 0700 09/27/22 0115  WBC 13.1* 9.8  NEUTROABS 12.3*  --   HGB 10.8* 9.9*  HCT 34.2* 29.8*  MCV 90.2 87.9  PLT 348 [id number]    Basic Metabolic Panel: Recent Labs  Lab 09/26/22 0700 09/27/22 0115  NA 132* 137  K 3.7 4.0  CL 99 106  CO2 21* 23  GLUCOSE 159* 112*  BUN 13 13  CREATININE 1.40* 1.06*  CALCIUM 8.6* 8.4*    Liver Function Tests: Recent Labs  Lab 09/26/22 0700 09/27/22 0115  AST 57* 54*  ALT 31 30  ALKPHOS 110 95  BILITOT 0.5 0.5  PROT 6.4* 5.6*  ALBUMIN 3.4* 2.5*    CBG: No results for input(s): "GLUCAP" in the last 168 hours.  Microbiology Studies:    Recent Results (from the past 240 hour(s))  Blood Culture (routine x 2)     Status: Abnormal   Collection Time: 09/26/22  7:00 AM   Specimen: BLOOD  Result Value Ref Range Status   Specimen Description BLOOD RIGHT ANTECUBITAL  Final   Special Requests   Final    BOTTLES DRAWN AEROBIC AND ANAEROBIC Blood Culture adequate volume   Culture  Setup Time   Final    GRAM POSITIVE COCCI IN CLUSTERS IN BOTH AEROBIC AND ANAEROBIC BOTTLES Organism ID to follow CRITICAL RESULT CALLED TO, READ BACK BY AND VERIFIED WITH: T RUDISILL,PHARMD'@2320'$  09/26/22 [place] Performed at [facility], [address], [place], [place] [address]    Culture STAPHYLOCOCCUS AUREUS (A)  Final   Report Status 09/29/2022 FINAL  Final   Organism ID, Bacteria STAPHYLOCOCCUS AUREUS  Final      Susceptibility   Staphylococcus aureus - MIC*    CIPROFLOXACIN <=0.5 SENSITIVE Sensitive     ERYTHROMYCIN <=0.25 SENSITIVE Sensitive     GENTAMICIN <=0.5 SENSITIVE Sensitive     OXACILLIN 0.5 SENSITIVE Sensitive     TETRACYCLINE <=1 SENSITIVE Sensitive  VANCOMYCIN 1 SENSITIVE Sensitive     TRIMETH/SULFA <=10 SENSITIVE Sensitive     CLINDAMYCIN <=0.25 SENSITIVE Sensitive     RIFAMPIN <=0.5 SENSITIVE Sensitive     Inducible Clindamycin NEGATIVE Sensitive     * STAPHYLOCOCCUS AUREUS  Blood Culture ID Panel (Reflexed)     Status: Abnormal   Collection Time: 09/26/22  7:00 AM  Result Value Ref Range Status   Enterococcus faecalis NOT DETECTED NOT DETECTED Final   Enterococcus Faecium NOT DETECTED NOT DETECTED Final   Listeria monocytogenes NOT DETECTED NOT DETECTED Final   Staphylococcus species DETECTED (A) NOT DETECTED Final    Comment: CRITICAL RESULT CALLED TO, READ BACK BY AND VERIFIED WITH: T RUDISILL,PHARMD'@2320'$  09/26/22 [place]    Staphylococcus aureus (BCID) DETECTED (A) NOT DETECTED Final    Comment: CRITICAL RESULT CALLED TO, READ BACK BY AND VERIFIED WITH: T RUDISILL,PHARMD'@2320'$  09/26/22 [place]    Staphylococcus  epidermidis NOT DETECTED NOT DETECTED Final   Staphylococcus lugdunensis NOT DETECTED NOT DETECTED Final   Streptococcus species NOT DETECTED NOT DETECTED Final   Streptococcus agalactiae NOT DETECTED NOT DETECTED Final   Streptococcus pneumoniae NOT DETECTED NOT DETECTED Final   Streptococcus pyogenes NOT DETECTED NOT DETECTED Final   A.calcoaceticus-baumannii NOT DETECTED NOT DETECTED Final   Bacteroides fragilis NOT DETECTED NOT DETECTED Final   Enterobacterales NOT DETECTED NOT DETECTED Final   Enterobacter cloacae complex NOT DETECTED NOT DETECTED Final   Escherichia coli NOT DETECTED NOT DETECTED Final   Klebsiella aerogenes NOT DETECTED NOT DETECTED Final   Klebsiella oxytoca NOT DETECTED NOT DETECTED Final   Klebsiella pneumoniae NOT DETECTED NOT DETECTED Final   Proteus species NOT DETECTED NOT DETECTED Final   Salmonella species NOT DETECTED NOT DETECTED Final   Serratia marcescens NOT DETECTED NOT DETECTED Final   Haemophilus influenzae NOT DETECTED NOT DETECTED Final   Neisseria meningitidis NOT DETECTED NOT DETECTED Final   Pseudomonas aeruginosa NOT DETECTED NOT DETECTED Final   Stenotrophomonas maltophilia NOT DETECTED NOT DETECTED Final   Candida albicans NOT DETECTED NOT DETECTED Final   Candida auris NOT DETECTED NOT DETECTED Final   Candida glabrata NOT DETECTED NOT DETECTED Final   Candida krusei NOT DETECTED NOT DETECTED Final   Candida parapsilosis NOT DETECTED NOT DETECTED Final   Candida tropicalis NOT DETECTED NOT DETECTED Final   Cryptococcus neoformans/gattii NOT DETECTED NOT DETECTED Final   Meth resistant mecA/C and MREJ NOT DETECTED NOT DETECTED Final    Comment: Performed at [facility], [address] [place], [place] [address]  Blood Culture (routine x 2)     Status: Abnormal   Collection Time: 09/26/22  7:02 AM   Specimen: BLOOD  Result Value Ref Range Status   Specimen Description BLOOD LEFT ANTECUBITAL  Final   Special Requests   Final     BOTTLES DRAWN AEROBIC AND ANAEROBIC Blood Culture adequate volume   Culture  Setup Time   Final    GRAM POSITIVE COCCI IN BOTH AEROBIC AND ANAEROBIC BOTTLES CRITICAL VALUE NOTED.  VALUE IS CONSISTENT WITH PREVIOUSLY REPORTED AND CALLED VALUE.    Culture (A)  Final    STAPHYLOCOCCUS AUREUS SUSCEPTIBILITIES PERFORMED ON PREVIOUS CULTURE WITHIN THE LAST 5 DAYS. Performed at [facility], [place] [address], [place], [place] [address]    Report Status 09/29/2022 FINAL  Final  Resp panel by RT-PCR (RSV, Flu A&B, Covid) Anterior Nasal Swab     Status: None   Collection Time: 09/26/22 10:14 AM   Specimen: Anterior  Nasal Swab  Result Value Ref Range Status   SARS Coronavirus 2 by RT PCR NEGATIVE NEGATIVE Final   Influenza A by PCR NEGATIVE NEGATIVE Final   Influenza B by PCR NEGATIVE NEGATIVE Final    Comment: (NOTE) The Xpert Xpress SARS-CoV-2/FLU/RSV plus assay is intended as an aid in the diagnosis of influenza from Nasopharyngeal swab specimens and should not be used as a sole basis for treatment. Nasal washings and aspirates are unacceptable for Xpert Xpress SARS-CoV-2/FLU/RSV testing.  Fact Sheet for Patients: EntrepreneurPulse.com.au  Fact Sheet for Healthcare Providers: IncredibleEmployment.be  This test is not yet approved or cleared by the [place] FDA and has been authorized for detection and/or diagnosis of SARS-CoV-2 by FDA under an Emergency Use Authorization (EUA). This EUA will remain in effect (meaning this test can be used) for the duration of the COVID-19 declaration under Section 564(b)(1) of the Act, 21 U.S.C. section 360bbb-3(b)(1), unless the authorization is terminated or revoked.     Resp Syncytial Virus by PCR NEGATIVE NEGATIVE Final    Comment: (NOTE) Fact Sheet for Patients: EntrepreneurPulse.com.au  Fact Sheet for Healthcare Providers: IncredibleEmployment.be  This test  is not yet approved or cleared by the [place] FDA and has been authorized for detection and/or diagnosis of SARS-CoV-2 by FDA under an Emergency Use Authorization (EUA). This EUA will remain in effect (meaning this test can be used) for the duration of the COVID-19 declaration under Section 564(b)(1) of the Act, 21 U.S.C. section 360bbb-3(b)(1), unless the authorization is terminated or revoked.  Performed at [facility], [place] [address] [place], [place] [address]   CSF culture w Gram Stain     Status: None (Preliminary result)   Collection Time: 09/27/22  4:00 PM   Specimen: PATH Cytology CSF; Cerebrospinal Fluid  Result Value Ref Range Status   Specimen Description CSF  Final   Special Requests NONE  Final   Gram Stain   Final    RARE WBC PRESENT, PREDOMINANTLY PMN RARE GRAM POSITIVE COCCI CRITICAL RESULT CALLED TO, READ BACK BY AND VERIFIED WITH: RN ERIN ROWETTE ON 09/27/22 @ 1757 BY DRT    Culture   Final    FEW STAPHYLOCOCCUS AUREUS SUSCEPTIBILITIES TO FOLLOW Performed at [facility], [place] [address] [place], [place] [address]    Report Status PENDING  Incomplete  Anaerobic culture w Gram Stain     Status: None (Preliminary result)   Collection Time: 09/27/22  4:00 PM   Specimen: PATH Cytology CSF; Cerebrospinal Fluid  Result Value Ref Range Status   Specimen Description CSF  Final   Special Requests NONE  Final   Gram Stain   Final    RARE WBC PRESENT, PREDOMINANTLY PMN RARE GRAM POSITIVE COCCI Performed at [facility], [place] [address] [place], [place] [address]    Culture   Final    NO ANAEROBES ISOLATED; CULTURE IN PROGRESS FOR 5 DAYS   Report Status PENDING  Incomplete  Culture, blood (Routine X 2) w Reflex to ID Panel     Status: None (Preliminary result)   Collection Time: 09/28/22  8:10 AM   Specimen: BLOOD  Result Value Ref Range Status   Specimen Description BLOOD RIGHT ANTECUBITAL  Final   Special Requests   Final    BOTTLES  DRAWN AEROBIC AND ANAEROBIC Blood Culture adequate volume   Culture   Final    NO GROWTH < 24 HOURS Performed at [facility], [place]  [address], [place], [place] [address]    Report Status PENDING  Incomplete  Culture, blood (Routine X 2) w Reflex to ID Panel     Status: None (Preliminary result)   Collection Time: 09/28/22  8:21 AM   Specimen: BLOOD RIGHT HAND  Result Value Ref Range Status   Specimen Description BLOOD RIGHT HAND  Final   Special Requests   Final    BOTTLES DRAWN AEROBIC AND ANAEROBIC Blood Culture adequate volume   Culture   Final    NO GROWTH < 24 HOURS Performed at [facility], [place] [address], [place],  [address]    Report Status PENDING  Incomplete    Radiology Studies:  ECHOCARDIOGRAM COMPLETE  Result Date: 09/28/2022    ECHOCARDIOGRAM REPORT   Patient Name:   OLEDA GUIFFRE Date of Exam: 09/28/2022 Medical Rec #:  [id number]          Height:       68.0 in Accession #:    [id number]         Weight:       178.0 lb Date of Birth:  March 15, 1961          BSA:          1.945 m Patient Age:    54 years           BP:           117/74 mmHg Patient Gender: F                  HR:           71 bpm. Exam Location:  Inpatient Procedure: 2D Echo, Cardiac Doppler, Color Doppler and Strain Analysis Indications:    Bacteremia R78.81  History:        Patient has no prior history of Echocardiogram examinations.                 Migraine.  Sonographer:    Ronny Flurry Referring Phys: [id number] [place]  1. Left ventricular ejection fraction, by estimation, is 60 to 65%. Left ventricular ejection fraction by 3D volume is 63 %. The left ventricle has normal function. The left ventricle has no regional wall motion abnormalities. Left ventricular diastolic  parameters are consistent with Grade I diastolic dysfunction (impaired relaxation).  2. Right ventricular systolic function is normal. The right ventricular size is normal. Tricuspid regurgitation signal  is inadequate for assessing PA pressure.  3. The mitral valve is grossly normal. Trivial mitral valve regurgitation. No evidence of mitral stenosis.  4. The aortic valve is grossly normal. Aortic valve regurgitation is not visualized. No aortic stenosis is present.  5. The inferior vena cava is normal in size with greater than 50% respiratory variability, suggesting right atrial pressure of 3 mmHg. Conclusion(s)/Recommendation(s): No evidence of valvular vegetations on this transthoracic echocardiogram. Consider a transesophageal echocardiogram to exclude infective endocarditis if clinically indicated. FINDINGS  Left Ventricle: Left ventricular ejection fraction, by estimation, is 60 to 65%. Left ventricular ejection fraction by 3D volume is 63 %. The left ventricle has normal function. The left ventricle has no regional wall motion abnormalities. Global longitudinal strain performed but not reported based on interpreter judgement due to suboptimal tracking. The left ventricular internal cavity size was normal in size. There is no left ventricular hypertrophy. Left ventricular diastolic parameters are consistent with Grade I diastolic dysfunction (impaired relaxation). Right Ventricle: The right ventricular size is normal. No increase in right ventricular wall thickness.  Right ventricular systolic function is normal. Tricuspid regurgitation signal is inadequate for assessing PA pressure. Left Atrium: Left atrial size was normal in size. Right Atrium: Right atrial size was normal in size. Pericardium: Trivial pericardial effusion is present. Mitral Valve: The mitral valve is grossly normal. Trivial mitral valve regurgitation. No evidence of mitral valve stenosis. Tricuspid Valve: The tricuspid valve is grossly normal. Tricuspid valve regurgitation is trivial. No evidence of tricuspid stenosis. Aortic Valve: The aortic valve is grossly normal. Aortic valve regurgitation is not visualized. No aortic stenosis is  present. Aortic valve mean gradient measures 4.0 mmHg. Aortic valve peak gradient measures 6.8 mmHg. Aortic valve area, by VTI measures 3.04 cm. Pulmonic Valve: The pulmonic valve was grossly normal. Pulmonic valve regurgitation is not visualized. No evidence of pulmonic stenosis. Aorta: The aortic root and ascending aorta are structurally normal, with no evidence of dilitation. Venous: The inferior vena cava is normal in size with greater than 50% respiratory variability, suggesting right atrial pressure of 3 mmHg. IAS/Shunts: The atrial septum is grossly normal.  LEFT VENTRICLE PLAX 2D LVIDd:         4.10 cm         Diastology LVIDs:         2.80 cm         LV e' medial:    6.85 cm/s LV PW:         0.80 cm         LV E/e' medial:  9.8 LV IVS:        1.00 cm         LV e' lateral:   5.55 cm/s LVOT diam:     2.20 cm         LV E/e' lateral: 12.1 LV SV:         88 LV SV Index:   45 LVOT Area:     3.80 cm        3D Volume EF                                LV 3D EF:    Left                                             ventricul                                             ar                                             ejection                                             fraction  by 3D                                             volume is                                             63 %.                                 3D Volume EF:                                3D EF:        63 %                                LV EDV:       183 ml                                LV ESV:       67 ml                                LV SV:        116 ml RIGHT VENTRICLE             IVC RV S prime:     12.40 cm/s  IVC diam: 1.40 cm TAPSE (M-mode): 2.2 cm LEFT ATRIUM           Index        RIGHT ATRIUM           Index LA diam:      2.80 cm 1.44 cm/m   RA Area:     12.60 cm LA Vol (A2C): 43.6 ml 22.42 ml/m  RA Volume:   26.90 ml  13.83 ml/m LA Vol (A4C): 27.9 ml 14.34 ml/m  AORTIC VALVE AV Area  (Vmax):    3.18 cm AV Area (Vmean):   2.96 cm AV Area (VTI):     3.04 cm AV Vmax:           130.00 cm/s AV Vmean:          90.000 cm/s AV VTI:            0.291 m AV Peak Grad:      6.8 mmHg AV Mean Grad:      4.0 mmHg LVOT Vmax:         108.67 cm/s LVOT Vmean:        70.133 cm/s LVOT VTI:          0.233 m LVOT/AV VTI ratio: 0.80  AORTA Ao Root diam: 3.40 cm Ao Asc diam:  3.50 cm MITRAL VALVE MV Area (PHT): 3.48 cm    SHUNTS MV Decel Time: 218 msec    Systemic VTI:  0.23 m MV E velocity: 67.40 cm/s  Systemic Diam: 2.20 cm MV A velocity: 85.90 cm/s MV E/A ratio:  0.78 Eleonore Chiquito MD Electronically signed by [place]  O'Neal MD Signature Date/Time: 09/28/2022/1:33:07 PM    Final    MR Lumbar Spine W Wo Contrast  Addendum Date: 09/28/2022   ADDENDUM REPORT: 09/28/2022 10:16 ADDENDUM: Critical Value/emergent results were called by telephone at the time of interpretation on 09/28/2022 at 10:14 am to provider KYLE CABBELL , who verbally acknowledged these results. Based on this discussion and the patient's clinical condition, the subdural collection is favored to represent hematoma. Electronically Signed   By: Emmit Alexanders M.D.   On: 09/28/2022 10:16   Result Date: 09/28/2022 CLINICAL DATA:  Wound infection. EXAM: MRI LUMBAR SPINE WITHOUT AND WITH CONTRAST TECHNIQUE: Multiplanar and multiecho pulse sequences of the lumbar spine were obtained without and with intravenous contrast. CONTRAST:  7.53m GADAVIST GADOBUTROL 1 MMOL/ML IV SOLN COMPARISON:  Lumbar spine CT 09/26/2022. Lumbar spine MRI 08/27/2022. FINDINGS: Segmentation: Conventional numbering is assumed with 5 non-rib-bearing, lumbar type vertebral bodies. Alignment:  Normal. Vertebrae: Interval postoperative changes right L4-5 hemilaminotomy and discectomy. Dorsal subdural fluid collection extending from L5 through at least T12 superiorly with few foci of susceptibility, likely representing gas at L2-3, possibly due to instrumentation for the previous day's  image guided lumbar puncture. Other than the foci of susceptibility, this collection predominantly demonstrates fluid signal. This may represent hematoma versus empyema. Conus medullaris and cauda equina: Conus extends to the T12 level. Conus and cauda equina appear normal. Paraspinal and other soft tissues: Expected postoperative changes along the right aspect of the L4 spinous process and L4-5 interspace. Disc levels: The above mentioned subdural collection results in mild to moderate compression of the cauda equina at multiple levels. IMPRESSION: 1. Predominantly fluid signal, dorsal subdural fluid collection extending from L5 through at least T12 superiorly. This may represent hematoma versus empyema. 2. The subdural collection results in mild to moderate compression of the cauda equina at multiple levels. Electronically Signed: By: WEmmit AlexandersM.D. On: 09/28/2022 09:50   DG FL GUIDED LUMBAR PUNCTURE  Result Date: 09/27/2022 CLINICAL DATA:  62year old female with history of recent lumbar surgery admitted with altered mental status and bacteremia. Lumbar puncture requested to evaluate for possible CNS infection. EXAM: DIAGNOSTIC LUMBAR PUNCTURE UNDER FLUOROSCOPIC GUIDANCE COMPARISON:  None Available. FLUOROSCOPY: Radiation Exposure Index (as provided by the fluoroscopic device): 37.4 mGy Kerma PROCEDURE: Informed consent was obtained from the patient prior to the procedure, including potential complications of headache, allergy, and pain. With the patient prone, the lower back was prepped with Betadine. 1% Lidocaine was used for local anesthesia. Lumbar puncture was initially attempted at L1-2 via a midline approach using a 3.5 inch 22 gauge needle with return of blood into the needle hub but no CSF despite needle repositioning and reverse Trendelenburg positioning. Puncture was then attempted at T12-L1 with the same results. Puncture was then performed at L2-3 via a right interlaminar approach with  return of bloody CSF which partially cleared. CSF flow was very slow despite reverse Trendelenburg positioning and slight needle adjustment. 3.5 mL of CSF were obtained for laboratory studies. The patient tolerated the procedure well and there were no apparent complications. This exam was performed by JRushie Nyhan NP, and was supervised and interpreted by ALogan Bores MD. IMPRESSION: Technically successful fluoroscopically guided lumbar puncture. Only a small amount of blood tinged CSF could be obtained as detailed above. Electronically Signed   By: ALogan BoresM.D.   On: 09/27/2022 16:55    Scheduled Meds:    acetaminophen  1,000 mg Oral Q8H   gabapentin  300 mg Oral  TID   pantoprazole  40 mg Oral Daily   sodium chloride flush  3 mL Intravenous Q12H    Continuous Infusions:    nafcillin 12 g in sodium chloride 0.9 % 500 mL continuous infusion 20.8 mL/hr at 09/29/22 1042     LOS: 3 days     Vernell Leep, MD,  FACP, [facility], [facility], [facility], [place]     To contact the attending provider between 7A-7P or the covering provider during after hours 7P-7A, please log into the web site [email or web address] and access using universal [place] password for that web site. If you do not have the password, please call the hospital operator.  09/29/2022, 3:26 PM

## 2022-09-29 NOTE — Care Management Important Message (Signed)
Important Message  Patient Details  Name: Amanda [name] MRN: [id number] Date of Birth: 10-14-1960   Medicare Important Message Given:  Yes     Shelda Altes 09/29/2022, 8:28 AM

## 2022-09-29 NOTE — Progress Notes (Signed)
[facility]  Date of Admission:  09/26/2022           Reason for visit: Follow up on MSSA bacteremia  Current antibiotics: Nafcillin  ASSESSMENT:    62 y.o. female admitted with:  MSSA bacteremia: In the setting of recent L4-5 microdiscectomy on 09/14/2022 as an outpatient.  Wound drainage reported prior to admission as a potential source with blood cultures positive on 09/26/2022.  Repeat blood cultures obtained 09/28/2022 are currently no growth to date.  She underwent MRI of the lumbar spine on 09/28/2022 which showed a subdural fluid collection from L5 through at least T12 superiorly.  Radiology and neurosurgery thinks this is consistent with hematoma. Suspected bacterial meningitis: She underwent a traumatic lumbar puncture which resulted in 46,000 RBCs.  However, she still had a fairly significant pleocytosis even when accounting for her RBCs.  Glucose and protein abnormal and her CSF cultures are also growing Staphylococcus aureus.  I think given the overall clinical picture, this is consistent with meningitis.  Additionally, I would have concern regarding her subdural fluid collection noted on MRI representing empyema or infected hematoma as well. Encephalopathy: This has improved. Ongoing headaches: In the setting of #1 and #2.  Would query the possibility of CSF leak?  RECOMMENDATIONS:    Continue nafcillin for targeted MSSA coverage Recommend TEE Follow-up repeat blood cultures She reports ongoing lower extremity spasm.  Query if the fluid collection noted to result in mild to moderate compression of the cauda equina at multiple levels may be contributing? Recommend MRI brain.  She states that she is willing to undergo this imaging and did well with Valium for her lumbar MRI 2 days ago.  Will reach out to [facility] Will reach out to radiology to ask about possible CSF leak imaging as a possibility Following   Principal Problem:   MSSA bacteremia Active  Problems:   Restless legs syndrome (RLS)   Other migraine without status migrainosus, not intractable   Low back pain   Severe sepsis (HCC)   Acute kidney injury ([place])   Surgical site infection   Sepsis with encephalopathy without septic shock (HCC)   Meningitis, staphylococcal    MEDICATIONS:    Scheduled Meds:  acetaminophen  1,000 mg Oral Q8H   gabapentin  300 mg Oral TID   pantoprazole  40 mg Oral Daily   sodium chloride flush  3 mL Intravenous Q12H   Continuous Infusions:  nafcillin 12 g in sodium chloride 0.9 % 500 mL continuous infusion 20.8 mL/hr at 09/29/22 0500   PRN Meds:.diazepam, HYDROmorphone (DILAUDID) injection, ibuprofen, mouth rinse, oxyCODONE, polyethylene glycol, traZODone  SUBJECTIVE:   24 hour events:  No acute events noted Neurosurgery recommendations noted regarding her MRI Tmax 98.2 Repeat blood cultures obtained yesterday pending No other new cultures TTE without evidence of vegetation CSF Gram stain with GPC's as noted.  Cultures are growing Staph aureus.   She reports that neurosurgery placed a stitch in her incision to close it last night.  Thus far there has been no further drainage that she is aware of.  She continues to report spasms in her leg.  She also reports terrible headache relieved with Dilaudid.  She reports the lumbar puncture was a difficult experience due to multiple attempts prior to being successful.  Review of Systems  All other systems reviewed and are negative.     OBJECTIVE:   Blood pressure 137/85, pulse 84, temperature 98.2 F (36.8 C), temperature  source Oral, resp. rate 13, height '5\' 8"'$  (1.727 m), weight 80.7 kg, SpO2 96 %. Body mass index is 27.06 kg/m.  Physical Exam Constitutional:      Appearance: Normal appearance.  HENT:     Head: Normocephalic and atraumatic.  Eyes:     Extraocular Movements: Extraocular movements intact.     Conjunctiva/sclera: Conjunctivae normal.  Pulmonary:     Effort:  Pulmonary effort is normal. No respiratory distress.  Abdominal:     General: There is no distension.     Palpations: Abdomen is soft.  Musculoskeletal:     Cervical back: Normal range of motion.     Right lower leg: No edema.     Left lower leg: No edema.  Skin:    General: Skin is warm and dry.  Neurological:     General: No focal deficit present.     Mental Status: She is alert and oriented to person, place, and time.  Psychiatric:        Mood and Affect: Mood normal.        Behavior: Behavior normal.      Lab Results: Lab Results  Component Value Date   WBC 9.8 09/27/2022   HGB 9.9 (L) 09/27/2022   HCT 29.8 (L) 09/27/2022   MCV 87.9 09/27/2022   PLT 241 09/27/2022    Lab Results  Component Value Date   NA 137 09/27/2022   K 4.0 09/27/2022   CO2 23 09/27/2022   GLUCOSE 112 (H) 09/27/2022   BUN 13 09/27/2022   CREATININE 1.06 (H) 09/27/2022   CALCIUM 8.4 (L) 09/27/2022   GFRNONAA 59 (L) 09/27/2022   GFRAA >60 05/25/2016    Lab Results  Component Value Date   ALT 30 09/27/2022   AST 54 (H) 09/27/2022   ALKPHOS 95 09/27/2022   BILITOT 0.5 09/27/2022    No results found for: "CRP"  No results found for: "ESRSEDRATE"   I have reviewed the micro and lab results in Epic.  Imaging: ECHOCARDIOGRAM COMPLETE  Result Date: 09/28/2022    ECHOCARDIOGRAM REPORT   Patient Name:   Amanda [name] Date of Exam: 09/28/2022 Medical Rec #:  [id number]          Height:       68.0 in Accession #:    [id number]         Weight:       178.0 lb Date of Birth:  April 13, 1961          BSA:          1.945 m Patient Age:    25 years           BP:           117/74 mmHg Patient Gender: F                  HR:           71 bpm. Exam Location:  Inpatient Procedure: 2D Echo, Cardiac Doppler, Color Doppler and Strain Analysis Indications:    Bacteremia R78.81  History:        Patient has no prior history of Echocardiogram examinations.                 Migraine.  Sonographer:    Ronny Flurry  Referring Phys: [id number] [place]  1. Left ventricular ejection fraction, by estimation, is 60 to 65%. Left ventricular ejection fraction by 3D volume is 63 %. The left ventricle has normal function.  The left ventricle has no regional wall motion abnormalities. Left ventricular diastolic  parameters are consistent with Grade I diastolic dysfunction (impaired relaxation).  2. Right ventricular systolic function is normal. The right ventricular size is normal. Tricuspid regurgitation signal is inadequate for assessing PA pressure.  3. The mitral valve is grossly normal. Trivial mitral valve regurgitation. No evidence of mitral stenosis.  4. The aortic valve is grossly normal. Aortic valve regurgitation is not visualized. No aortic stenosis is present.  5. The inferior vena cava is normal in size with greater than 50% respiratory variability, suggesting right atrial pressure of 3 mmHg. Conclusion(s)/Recommendation(s): No evidence of valvular vegetations on this transthoracic echocardiogram. Consider a transesophageal echocardiogram to exclude infective endocarditis if clinically indicated. FINDINGS  Left Ventricle: Left ventricular ejection fraction, by estimation, is 60 to 65%. Left ventricular ejection fraction by 3D volume is 63 %. The left ventricle has normal function. The left ventricle has no regional wall motion abnormalities. Global longitudinal strain performed but not reported based on interpreter judgement due to suboptimal tracking. The left ventricular internal cavity size was normal in size. There is no left ventricular hypertrophy. Left ventricular diastolic parameters are consistent with Grade I diastolic dysfunction (impaired relaxation). Right Ventricle: The right ventricular size is normal. No increase in right ventricular wall thickness. Right ventricular systolic function is normal. Tricuspid regurgitation signal is inadequate for assessing PA pressure. Left Atrium: Left atrial  size was normal in size. Right Atrium: Right atrial size was normal in size. Pericardium: Trivial pericardial effusion is present. Mitral Valve: The mitral valve is grossly normal. Trivial mitral valve regurgitation. No evidence of mitral valve stenosis. Tricuspid Valve: The tricuspid valve is grossly normal. Tricuspid valve regurgitation is trivial. No evidence of tricuspid stenosis. Aortic Valve: The aortic valve is grossly normal. Aortic valve regurgitation is not visualized. No aortic stenosis is present. Aortic valve mean gradient measures 4.0 mmHg. Aortic valve peak gradient measures 6.8 mmHg. Aortic valve area, by VTI measures 3.04 cm. Pulmonic Valve: The pulmonic valve was grossly normal. Pulmonic valve regurgitation is not visualized. No evidence of pulmonic stenosis. Aorta: The aortic root and ascending aorta are structurally normal, with no evidence of dilitation. Venous: The inferior vena cava is normal in size with greater than 50% respiratory variability, suggesting right atrial pressure of 3 mmHg. IAS/Shunts: The atrial septum is grossly normal.  LEFT VENTRICLE PLAX 2D LVIDd:         4.10 cm         Diastology LVIDs:         2.80 cm         LV e' medial:    6.85 cm/s LV PW:         0.80 cm         LV E/e' medial:  9.8 LV IVS:        1.00 cm         LV e' lateral:   5.55 cm/s LVOT diam:     2.20 cm         LV E/e' lateral: 12.1 LV SV:         88 LV SV Index:   45 LVOT Area:     3.80 cm        3D Volume EF                                LV 3D EF:    Left  ventricul                                             ar                                             ejection                                             fraction                                             by 3D                                             volume is                                             63 %.                                 3D Volume EF:                                3D EF:        63  %                                LV EDV:       183 ml                                LV ESV:       67 ml                                LV SV:        116 ml RIGHT VENTRICLE             IVC RV S prime:     12.40 cm/s  IVC diam: 1.40 cm TAPSE (M-mode): 2.2 cm LEFT ATRIUM           Index        RIGHT ATRIUM           Index LA diam:      2.80 cm 1.44 cm/m   RA Area:     12.60 cm LA Vol (A2C): 43.6 ml 22.42 ml/m  RA Volume:   26.90 ml  13.83 ml/m LA Vol (A4C): 27.9 ml 14.34 ml/m  AORTIC VALVE AV Area (Vmax):    3.18 cm AV Area (  Vmean):   2.96 cm AV Area (VTI):     3.04 cm AV Vmax:           130.00 cm/s AV Vmean:          90.000 cm/s AV VTI:            0.291 m AV Peak Grad:      6.8 mmHg AV Mean Grad:      4.0 mmHg LVOT Vmax:         108.67 cm/s LVOT Vmean:        70.133 cm/s LVOT VTI:          0.233 m LVOT/AV VTI ratio: 0.80  AORTA Ao Root diam: 3.40 cm Ao Asc diam:  3.50 cm MITRAL VALVE MV Area (PHT): 3.48 cm    SHUNTS MV Decel Time: 218 msec    Systemic VTI:  0.23 m MV E velocity: 67.40 cm/s  Systemic Diam: 2.20 cm MV A velocity: 85.90 cm/s MV E/A ratio:  0.78 Amanda Chiquito MD Electronically signed by Amanda Chiquito MD Signature Date/Time: 09/28/2022/1:33:07 PM    Final    MR Lumbar Spine W Wo Contrast  Addendum Date: 09/28/2022   ADDENDUM REPORT: 09/28/2022 10:16 ADDENDUM: Critical Value/emergent results were called by telephone at the time of interpretation on 09/28/2022 at 10:14 am to provider KYLE CABBELL , who verbally acknowledged these results. Based on this discussion and the patient's clinical condition, the subdural collection is favored to represent hematoma. Electronically Signed   By: Emmit Alexanders M.D.   On: 09/28/2022 10:16   Result Date: 09/28/2022 CLINICAL DATA:  Wound infection. EXAM: MRI LUMBAR SPINE WITHOUT AND WITH CONTRAST TECHNIQUE: Multiplanar and multiecho pulse sequences of the lumbar spine were obtained without and with intravenous contrast. CONTRAST:  7.79m GADAVIST GADOBUTROL 1  MMOL/ML IV SOLN COMPARISON:  Lumbar spine CT 09/26/2022. Lumbar spine MRI 08/27/2022. FINDINGS: Segmentation: Conventional numbering is assumed with 5 non-rib-bearing, lumbar type vertebral bodies. Alignment:  Normal. Vertebrae: Interval postoperative changes right L4-5 hemilaminotomy and discectomy. Dorsal subdural fluid collection extending from L5 through at least T12 superiorly with few foci of susceptibility, likely representing gas at L2-3, possibly due to instrumentation for the previous day's image guided lumbar puncture. Other than the foci of susceptibility, this collection predominantly demonstrates fluid signal. This may represent hematoma versus empyema. Conus medullaris and cauda equina: Conus extends to the T12 level. Conus and cauda equina appear normal. Paraspinal and other soft tissues: Expected postoperative changes along the right aspect of the L4 spinous process and L4-5 interspace. Disc levels: The above mentioned subdural collection results in mild to moderate compression of the cauda equina at multiple levels. IMPRESSION: 1. Predominantly fluid signal, dorsal subdural fluid collection extending from L5 through at least T12 superiorly. This may represent hematoma versus empyema. 2. The subdural collection results in mild to moderate compression of the cauda equina at multiple levels. Electronically Signed: By: WEmmit AlexandersM.D. On: 09/28/2022 09:50   DG FL GUIDED LUMBAR PUNCTURE  Result Date: 09/27/2022 CLINICAL DATA:  62year old female with history of recent lumbar surgery admitted with altered mental status and bacteremia. Lumbar puncture requested to evaluate for possible CNS infection. EXAM: DIAGNOSTIC LUMBAR PUNCTURE UNDER FLUOROSCOPIC GUIDANCE COMPARISON:  None Available. FLUOROSCOPY: Radiation Exposure Index (as provided by the fluoroscopic device): 37.4 mGy Kerma PROCEDURE: Informed consent was obtained from the patient prior to the procedure, including potential complications  of headache, allergy, and pain. With the patient prone, the lower back was prepped with  Betadine. 1% Lidocaine was used for local anesthesia. Lumbar puncture was initially attempted at L1-2 via a midline approach using a 3.5 inch 22 gauge needle with return of blood into the needle hub but no CSF despite needle repositioning and reverse Trendelenburg positioning. Puncture was then attempted at T12-L1 with the same results. Puncture was then performed at L2-3 via a right interlaminar approach with return of bloody CSF which partially cleared. CSF flow was very slow despite reverse Trendelenburg positioning and slight needle adjustment. 3.5 mL of CSF were obtained for laboratory studies. The patient tolerated the procedure well and there were no apparent complications. This exam was performed by Rushie Nyhan, NP, and was supervised and interpreted by Logan Bores, MD. IMPRESSION: Technically successful fluoroscopically guided lumbar puncture. Only a small amount of blood tinged CSF could be obtained as detailed above. Electronically Signed   By: Logan Bores M.D.   On: 09/27/2022 16:55   CT HEAD WO CONTRAST (5MM)  Result Date: 09/27/2022 CLINICAL DATA:  Bacteremia. Recent lumbar surgery. Assessment prior to planned lumbar puncture. EXAM: CT HEAD WITHOUT CONTRAST TECHNIQUE: Contiguous axial images were obtained from the base of the skull through the vertex without intravenous contrast. RADIATION DOSE REDUCTION: This exam was performed according to the departmental dose-optimization program which includes automated exposure control, adjustment of the mA and/or kV according to patient size and/or use of iterative reconstruction technique. COMPARISON:  Head CT 04/18/2016 and MRI 04/19/2016 FINDINGS: Brain: There is no evidence of an acute infarct, intracranial hemorrhage, mass, midline shift, or extra-axial fluid collection. The ventricles and sulci are normal. Vascular: Calcified atherosclerosis at the skull  base. No hyperdense vessel. Skull: No acute fracture or suspicious osseous lesion. Sinuses/Orbits: Visualized paranasal sinuses and mastoid air cells are clear. Unremarkable orbits. Other: None. IMPRESSION: Unremarkable CT appearance of the brain. Electronically Signed   By: Logan Bores M.D.   On: 09/27/2022 14:41     Imaging independently reviewed in Epic.    [facility] [phone] pager 09/29/2022, 7:56 AM

## 2022-09-30 DIAGNOSIS — G003 Staphylococcal meningitis: Secondary | ICD-10-CM | POA: Diagnosis not present

## 2022-09-30 DIAGNOSIS — A419 Sepsis, unspecified organism: Secondary | ICD-10-CM | POA: Diagnosis not present

## 2022-09-30 DIAGNOSIS — R6520 Severe sepsis without septic shock: Secondary | ICD-10-CM | POA: Diagnosis not present

## 2022-09-30 DIAGNOSIS — B9561 Methicillin susceptible Staphylococcus aureus infection as the cause of diseases classified elsewhere: Secondary | ICD-10-CM | POA: Diagnosis not present

## 2022-09-30 LAB — CBC
HCT: 33.6 % — ABNORMAL LOW (ref 36.0–46.0)
Hemoglobin: 10.8 g/dL — ABNORMAL LOW (ref 12.0–15.0)
MCH: 28.1 pg (ref 26.0–34.0)
MCHC: 32.1 g/dL (ref 30.0–36.0)
MCV: 87.5 fL (ref 80.0–100.0)
Platelets: 240 10*3/uL (ref 150–400)
RBC: 3.84 MIL/uL — ABNORMAL LOW (ref 3.87–5.11)
RDW: 14.4 % (ref 11.5–15.5)
WBC: 6 10*3/uL (ref 4.0–10.5)
nRBC: 0 % (ref 0.0–0.2)

## 2022-09-30 LAB — BASIC METABOLIC PANEL
Anion gap: 13 (ref 5–15)
BUN: 6 mg/dL — ABNORMAL LOW (ref 8–23)
CO2: 19 mmol/L — ABNORMAL LOW (ref 22–32)
Calcium: 8.3 mg/dL — ABNORMAL LOW (ref 8.9–10.3)
Chloride: 104 mmol/L (ref 98–111)
Creatinine, Ser: 0.97 mg/dL (ref 0.44–1.00)
GFR, Estimated: >60 mL/min (ref >60)
Glucose, Bld: 100 mg/dL — ABNORMAL HIGH (ref 70–99)
Potassium: 3.8 mmol/L (ref 3.5–5.1)
Sodium: 136 mmol/L (ref 135–145)

## 2022-09-30 LAB — CSF CULTURE W GRAM STAIN

## 2022-09-30 LAB — COMPREHENSIVE METABOLIC PANEL
ALT: 28 U/L (ref 0–44)
AST: 34 U/L (ref 15–41)
Albumin: 2.3 g/dL — ABNORMAL LOW (ref 3.5–5.0)
Alkaline Phosphatase: 178 U/L — ABNORMAL HIGH (ref 38–126)
Anion gap: 16 — ABNORMAL HIGH (ref 5–15)
BUN: 8 mg/dL (ref 8–23)
CO2: 16 mmol/L — ABNORMAL LOW (ref 22–32)
Calcium: 8.2 mg/dL — ABNORMAL LOW (ref 8.9–10.3)
Chloride: 102 mmol/L (ref 98–111)
Creatinine, Ser: 1.05 mg/dL — ABNORMAL HIGH (ref 0.44–1.00)
GFR, Estimated: 60 mL/min — ABNORMAL LOW (ref >60)
Glucose, Bld: 73 mg/dL (ref 70–99)
Potassium: 2.9 mmol/L — ABNORMAL LOW (ref 3.5–5.1)
Sodium: 134 mmol/L — ABNORMAL LOW (ref 135–145)
Total Bilirubin: 2.3 mg/dL — ABNORMAL HIGH (ref 0.3–1.2)
Total Protein: 5.6 g/dL — ABNORMAL LOW (ref 6.5–8.1)

## 2022-09-30 LAB — MAGNESIUM: Magnesium: 1.8 mg/dL (ref 1.7–2.4)

## 2022-09-30 MED ORDER — SODIUM CHLORIDE 0.9 % IV SOLN: INTRAVENOUS | Status: DC

## 2022-09-30 MED ORDER — LACTATED RINGERS IV SOLN: INTRAVENOUS | Status: DC

## 2022-09-30 MED ORDER — VENLAFAXINE HCL ER 75 MG PO CP24
225.0000 mg | ORAL_CAPSULE | Freq: Every day | ORAL | Status: DC
  Administered 2022-09-30 – 2022-10-01 (×2): 225 mg via ORAL
  Filled 2022-09-30 (×2): qty 3

## 2022-09-30 MED ORDER — POTASSIUM CHLORIDE CRYS ER 20 MEQ PO TBCR
40.0000 meq | EXTENDED_RELEASE_TABLET | ORAL | Status: AC
  Administered 2022-09-30 (×2): 40 meq via ORAL
  Filled 2022-09-30 (×2): qty 2

## 2022-09-30 NOTE — Progress Notes (Signed)
PROGRESS NOTE   Amanda [name]  [id number]    DOB: 27-Oct-1960    DOA: 09/26/2022  PCP: Carol Ada, MD   I have briefly reviewed patients previous medical records in [facility].  Chief Complaint  Patient presents with   Altered Mental Status   Claudication    Brief Narrative:  62 year old married female, s/p right L4-5 laminotomy and microdiscectomy at the outpatient surgical center on 09/14/2022, unremarkable postop course, developed acute onset of confusion, cramping in her right leg, restlessness and combativeness and brought to the ED on 09/26/2022 where she was noted to have a fever of 105 F.  She was admitted for severe sepsis, acute encephalopathy and acute kidney injury.  Since then she is diagnosed with MSSA bacteremia, suspected bacterial meningitis.  ID and neurosurgery consulted and following.  Ongoing severe headaches, improving.   Assessment & Plan:  Principal Problem:   MSSA bacteremia Active Problems:   Restless legs syndrome (RLS)   Other migraine without status migrainosus, not intractable   Low back pain   Severe sepsis (HCC)   Acute kidney injury [facility])   Surgical site infection   Sepsis with encephalopathy without septic shock (HCC)   Meningitis, staphylococcal   Severe sepsis, secondary to MSSA bacteremia in the setting of recent L4-5 microdiscectomy (09/14/2022) with suspected bacterial meningitis, all POA: Surveillance blood cultures from 3/5: Negative to date.  Sepsis physiology resolved.  ID following.  MSSA bacteremia: ID follow-up appreciated.  Surgical wound infection as a potential source for bacteremia.  Blood cultures from 09/26/2022 positive for MSSA.  Surveillance blood cultures from 09/28/2022: Remain negative to date.  MRI L-spine 3/5 showed subdural fluid collection from L5 through at least T12.  As per radiology and neurosurgery, they feel this is a hematoma.  Continue IV nafcillin.  TTE showed normal EF, no evidence of vegetations.   Arranged TEE with cardiology for 3/8  Suspected bacterial meningitis: Difficult and traumatic lumbar puncture.  CSF growing Staph aureus.  Although ID felt that her L-spine subdural fluid collection on MRI could represent an empyema or infected hematoma, Neurosurgery disagrees and indicated a subdural infection in the lumbar spine is exceedingly rare.  Continue IV nafcillin.  Septic encephalopathy: Secondary to above.  Resolved as per discussion with spouse at bedside  Severe headache: Probably multifactorial related to acute bacterial meningitis, per patient report worsened after the LP and hence CSF leak is a potential.  However per neurosurgery, no evidence of CSF leak.  MRI brain with and without contrast 3/6: Mild diffuse smooth dural thickening, which is nonspecific but can be seen in the setting of bacterial meningitis.  No additional acute intracranial process.  Headache finally starting to improve.  Encouraged better p.o. intake and mobilization.  Acute kidney injury Creatinine had peaked to 1.4.  Resolved.  Normocytic anemia: Reportedly chronic.  Follow CBC periodically.  Hypokalemia: Potassium 2.9 on 3/7.  Replacing aggressively and follow BMP later this afternoon.  Magnesium 1.8.  Also with bicarb of 16 and anion gap of 16, unclear etiology.  Depression, migraine, RLS: Gabapentin has already been resumed.  Effexor had been held in the setting of acute encephalopathy.  However patient now reports feeling depressed and since her mental status is back to baseline, resumed home dose of Effexor XR to 25 Mg daily.  Constipation: Last BM approximately 5 days ago.  Passing flatus.  Had a single episode of nonbloody emesis yesterday.  Poor oral intake.  Declines bowel regimen repeatedly.  Declines resuming  home dose of Linzess.  Isolated hyperbilirubinemia: Unclear etiology.  Total bilirubin 2.3.  Mildly elevated alkaline phosphatase.  Follow LFTs and a.m. including split bilirubin.?   Related to subdural hematoma.  Body mass index is 27.06 kg/m.    DVT prophylaxis: SCDs Start: 09/26/22 1259     Code Status: Full Code:  ACP Documents: None present Family Communication: Spouse at bedside. Disposition:  Status is: Inpatient Remains inpatient appropriate because: Ongoing IV antibiotics.  Needs TEE.     Consultants:   Infectious disease Neurosurgery   Procedures:   Lumbar puncture  Antimicrobials:   As noted above   Subjective:  Headache finally better.  States that she had a rough time with MRI last night because she got her anxiolytic/Valium only 5 minutes prior to MRI.  No further emesis since yesterday morning.  Passing flatus.  No BM.  Objective:   Vitals:   09/29/22 2317 09/30/22 0322 09/30/22 0835 09/30/22 1145  BP: (!) 145/78 130/74  135/78  Pulse: 93 86  (!) 107  Resp: '18 18  12  '$ Temp: 98.3 F (36.8 C) 98 F (36.7 C) 98 F (36.7 C) 98.1 F (36.7 C)  TempSrc: Oral Oral Oral Oral  SpO2: 98% 98%  92%  Weight:      Height:        General exam: Middle-age female, moderately built and nourished lying comfortably supine in bed without distress.  Looks improved compared to yesterday. Respiratory system: Clear to auscultation.  No increased work of breathing. Cardiovascular system: S1 and S2 heard, RRR.  No JVD, murmurs or pedal edema.  Telemetry personally reviewed: Sinus rhythm.  Discontinued telemetry. Gastrointestinal system: Abdomen is nondistended, soft and nontender. No organomegaly or masses felt. Normal bowel sounds heard. Central nervous system: Alert and oriented. No focal neurological deficits. Extremities: Symmetric 5 x 5 power. Skin: No rashes, lesions or ulcers.  As examined on 3/6: Lumbar surgical site examined along with spouse, dressing mildly soaked with dried blood but otherwise no acute findings. Psychiatry: Judgement and insight appear normal. Mood & affect appropriate.     Data Reviewed:   I have personally reviewed  following labs and imaging studies   CBC: Recent Labs  Lab 09/26/22 0700 09/27/22 0115 09/30/22 0139  WBC 13.1* 9.8 6.0  NEUTROABS 12.3*  --   --   HGB 10.8* 9.9* 10.8*  HCT 34.2* 29.8* 33.6*  MCV 90.2 87.9 87.5  PLT 348 241 [id number]    Basic Metabolic Panel: Recent Labs  Lab 09/26/22 0700 09/27/22 0115 09/30/22 0139  NA 132* 137 134*  K 3.7 4.0 2.9*  CL 99 106 102  CO2 21* 23 16*  GLUCOSE 159* 112* 73  BUN '13 13 8  '$ CREATININE 1.40* 1.06* 1.05*  CALCIUM 8.6* 8.4* 8.2*  MG  --   --  1.8    Liver Function Tests: Recent Labs  Lab 09/26/22 0700 09/27/22 0115 09/30/22 0139  AST 57* 54* 34  ALT '31 30 28  '$ ALKPHOS 110 95 178*  BILITOT 0.5 0.5 2.3*  PROT 6.4* 5.6* 5.6*  ALBUMIN 3.4* 2.5* 2.3*    CBG: No results for input(s): "GLUCAP" in the last 168 hours.  Microbiology Studies:   Recent Results (from the past 240 hour(s))  Blood Culture (routine x 2)     Status: Abnormal   Collection Time: 09/26/22  7:00 AM   Specimen: BLOOD  Result Value Ref Range Status   Specimen Description BLOOD RIGHT ANTECUBITAL  Final   Special Requests  Final    BOTTLES DRAWN AEROBIC AND ANAEROBIC Blood Culture adequate volume   Culture  Setup Time   Final    GRAM POSITIVE COCCI IN CLUSTERS IN BOTH AEROBIC AND ANAEROBIC BOTTLES Organism ID to follow CRITICAL RESULT CALLED TO, READ BACK BY AND VERIFIED WITH: T RUDISILL,PHARMD'@2320'$  09/26/22 [place] Performed at [facility], [place] [address], [place], [place] [address]    Culture STAPHYLOCOCCUS AUREUS (A)  Final   Report Status 09/29/2022 FINAL  Final   Organism ID, Bacteria STAPHYLOCOCCUS AUREUS  Final      Susceptibility   Staphylococcus aureus - MIC*    CIPROFLOXACIN <=0.5 SENSITIVE Sensitive     ERYTHROMYCIN <=0.25 SENSITIVE Sensitive     GENTAMICIN <=0.5 SENSITIVE Sensitive     OXACILLIN 0.5 SENSITIVE Sensitive     TETRACYCLINE <=1 SENSITIVE Sensitive     VANCOMYCIN 1 SENSITIVE Sensitive     TRIMETH/SULFA <=10 SENSITIVE  Sensitive     CLINDAMYCIN <=0.25 SENSITIVE Sensitive     RIFAMPIN <=0.5 SENSITIVE Sensitive     Inducible Clindamycin NEGATIVE Sensitive     * STAPHYLOCOCCUS AUREUS  Blood Culture ID Panel (Reflexed)     Status: Abnormal   Collection Time: 09/26/22  7:00 AM  Result Value Ref Range Status   Enterococcus faecalis NOT DETECTED NOT DETECTED Final   Enterococcus Faecium NOT DETECTED NOT DETECTED Final   Listeria monocytogenes NOT DETECTED NOT DETECTED Final   Staphylococcus species DETECTED (A) NOT DETECTED Final    Comment: CRITICAL RESULT CALLED TO, READ BACK BY AND VERIFIED WITH: T RUDISILL,PHARMD'@2320'$  09/26/22 [place]    Staphylococcus aureus (BCID) DETECTED (A) NOT DETECTED Final    Comment: CRITICAL RESULT CALLED TO, READ BACK BY AND VERIFIED WITH: T RUDISILL,PHARMD'@2320'$  09/26/22 [place]    Staphylococcus epidermidis NOT DETECTED NOT DETECTED Final   Staphylococcus lugdunensis NOT DETECTED NOT DETECTED Final   Streptococcus species NOT DETECTED NOT DETECTED Final   Streptococcus agalactiae NOT DETECTED NOT DETECTED Final   Streptococcus pneumoniae NOT DETECTED NOT DETECTED Final   Streptococcus pyogenes NOT DETECTED NOT DETECTED Final   A.calcoaceticus-baumannii NOT DETECTED NOT DETECTED Final   Bacteroides fragilis NOT DETECTED NOT DETECTED Final   Enterobacterales NOT DETECTED NOT DETECTED Final   Enterobacter cloacae complex NOT DETECTED NOT DETECTED Final   Escherichia coli NOT DETECTED NOT DETECTED Final   Klebsiella aerogenes NOT DETECTED NOT DETECTED Final   Klebsiella oxytoca NOT DETECTED NOT DETECTED Final   Klebsiella pneumoniae NOT DETECTED NOT DETECTED Final   Proteus species NOT DETECTED NOT DETECTED Final   Salmonella species NOT DETECTED NOT DETECTED Final   Serratia marcescens NOT DETECTED NOT DETECTED Final   Haemophilus influenzae NOT DETECTED NOT DETECTED Final   Neisseria meningitidis NOT DETECTED NOT DETECTED Final   Pseudomonas aeruginosa NOT DETECTED NOT DETECTED  Final   Stenotrophomonas maltophilia NOT DETECTED NOT DETECTED Final   Candida albicans NOT DETECTED NOT DETECTED Final   Candida auris NOT DETECTED NOT DETECTED Final   Candida glabrata NOT DETECTED NOT DETECTED Final   Candida krusei NOT DETECTED NOT DETECTED Final   Candida parapsilosis NOT DETECTED NOT DETECTED Final   Candida tropicalis NOT DETECTED NOT DETECTED Final   Cryptococcus neoformans/gattii NOT DETECTED NOT DETECTED Final   Meth resistant mecA/C and MREJ NOT DETECTED NOT DETECTED Final    Comment: Performed at [facility], [address], [place], [place] [address]  Blood Culture (routine x 2)     Status: Abnormal   Collection Time: 09/26/22  7:02 AM   Specimen: BLOOD  Result Value Ref Range Status   Specimen Description BLOOD LEFT ANTECUBITAL  Final   Special Requests   Final    BOTTLES DRAWN AEROBIC AND ANAEROBIC Blood Culture adequate volume   Culture  Setup Time   Final    GRAM POSITIVE COCCI IN BOTH AEROBIC AND ANAEROBIC BOTTLES CRITICAL VALUE NOTED.  VALUE IS CONSISTENT WITH PREVIOUSLY REPORTED AND CALLED VALUE.    Culture (A)  Final    STAPHYLOCOCCUS AUREUS SUSCEPTIBILITIES PERFORMED ON PREVIOUS CULTURE WITHIN THE LAST 5 DAYS. Performed at [facility], [place] [address], [place], [place] [address]    Report Status 09/29/2022 FINAL  Final  Resp panel by RT-PCR (RSV, Flu A&B, Covid) Anterior Nasal Swab     Status: None   Collection Time: 09/26/22 10:14 AM   Specimen: Anterior Nasal Swab  Result Value Ref Range Status   SARS Coronavirus 2 by RT PCR NEGATIVE NEGATIVE Final   Influenza A by PCR NEGATIVE NEGATIVE Final   Influenza B by PCR NEGATIVE NEGATIVE Final    Comment: (NOTE) The Xpert Xpress SARS-CoV-2/FLU/RSV plus assay is intended as an aid in the diagnosis of influenza from Nasopharyngeal swab specimens and should not be used as a sole basis for treatment. Nasal washings and aspirates are unacceptable for Xpert Xpress  SARS-CoV-2/FLU/RSV testing.  Fact Sheet for Patients: EntrepreneurPulse.com.au  Fact Sheet for Healthcare Providers: IncredibleEmployment.be  This test is not yet approved or cleared by the [place] FDA and has been authorized for detection and/or diagnosis of SARS-CoV-2 by FDA under an Emergency Use Authorization (EUA). This EUA will remain in effect (meaning this test can be used) for the duration of the COVID-19 declaration under Section 564(b)(1) of the Act, 21 U.S.C. section 360bbb-3(b)(1), unless the authorization is terminated or revoked.     Resp Syncytial Virus by PCR NEGATIVE NEGATIVE Final    Comment: (NOTE) Fact Sheet for Patients: EntrepreneurPulse.com.au  Fact Sheet for Healthcare Providers: IncredibleEmployment.be  This test is not yet approved or cleared by the [place] FDA and has been authorized for detection and/or diagnosis of SARS-CoV-2 by FDA under an Emergency Use Authorization (EUA). This EUA will remain in effect (meaning this test can be used) for the duration of the COVID-19 declaration under Section 564(b)(1) of the Act, 21 U.S.C. section 360bbb-3(b)(1), unless the authorization is terminated or revoked.  Performed at [facility], [place] [address] [place], [place] [address]   CSF culture w Gram Stain     Status: None   Collection Time: 09/27/22  4:00 PM   Specimen: PATH Cytology CSF; Cerebrospinal Fluid  Result Value Ref Range Status   Specimen Description CSF  Final   Special Requests NONE  Final   Gram Stain   Final    RARE WBC PRESENT, PREDOMINANTLY PMN RARE GRAM POSITIVE COCCI CRITICAL RESULT CALLED TO, READ BACK BY AND VERIFIED WITH: RN ERIN ROWETTE ON 09/27/22 @ 1757 BY DRT Performed at [facility], [place] [address] [place], [place] [address]    Culture FEW STAPHYLOCOCCUS AUREUS  Final   Report Status 09/30/2022 FINAL  Final   Organism ID,  Bacteria STAPHYLOCOCCUS AUREUS  Final      Susceptibility   Staphylococcus aureus - MIC*    CIPROFLOXACIN <=0.5 SENSITIVE Sensitive     ERYTHROMYCIN <=0.25 SENSITIVE Sensitive     GENTAMICIN <=0.5 SENSITIVE Sensitive     OXACILLIN 0.5 SENSITIVE Sensitive     TETRACYCLINE <=1  SENSITIVE Sensitive     VANCOMYCIN 1 SENSITIVE Sensitive     TRIMETH/SULFA <=10 SENSITIVE Sensitive     CLINDAMYCIN <=0.25 SENSITIVE Sensitive     RIFAMPIN <=0.5 SENSITIVE Sensitive     Inducible Clindamycin NEGATIVE Sensitive     * FEW STAPHYLOCOCCUS AUREUS  Anaerobic culture w Gram Stain     Status: None (Preliminary result)   Collection Time: 09/27/22  4:00 PM   Specimen: PATH Cytology CSF; Cerebrospinal Fluid  Result Value Ref Range Status   Specimen Description CSF  Final   Special Requests NONE  Final   Gram Stain   Final    RARE WBC PRESENT, PREDOMINANTLY PMN RARE GRAM POSITIVE COCCI Performed at [facility], [place] [address], [place], [place] [address]    Culture   Final    NO ANAEROBES ISOLATED; CULTURE IN PROGRESS FOR 5 DAYS   Report Status PENDING  Incomplete  Culture, blood (Routine X 2) w Reflex to ID Panel     Status: None (Preliminary result)   Collection Time: 09/28/22  8:10 AM   Specimen: BLOOD  Result Value Ref Range Status   Specimen Description BLOOD RIGHT ANTECUBITAL  Final   Special Requests   Final    BOTTLES DRAWN AEROBIC AND ANAEROBIC Blood Culture adequate volume   Culture   Final    NO GROWTH 2 DAYS Performed at [facility], [place] [address], [place], [place] [address]    Report Status PENDING  Incomplete  Culture, blood (Routine X 2) w Reflex to ID Panel     Status: None (Preliminary result)   Collection Time: 09/28/22  8:21 AM   Specimen: BLOOD RIGHT HAND  Result Value Ref Range Status   Specimen Description BLOOD RIGHT HAND  Final   Special Requests   Final    BOTTLES DRAWN AEROBIC AND ANAEROBIC Blood Culture adequate volume   Culture   Final    NO GROWTH 2  DAYS Performed at [facility], [place] [address], [place], [place] [address]    Report Status PENDING  Incomplete    Radiology Studies:  MR BRAIN W WO CONTRAST  Result Date: 09/29/2022 CLINICAL DATA:  MSSA bacteremia and headaches, concern for meningitis; recent lumbar spinal surgery EXAM: MRI HEAD WITHOUT AND WITH CONTRAST TECHNIQUE: Multiplanar, multiecho pulse sequences of the brain and surrounding structures were obtained without and with intravenous contrast. CONTRAST:  7.41m GADAVIST GADOBUTROL 1 MMOL/ML IV SOLN COMPARISON:  MRI head 04/19/2016, correlation is also made with CT head 09/27/2022 FINDINGS: Brain: Mild diffuse smooth dural thickening, without definite extension into the sulci. 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 No other abnormal parenchymal or leptomeningeal enhancement. No restricted diffusion to suggest acute or subacute infarct. No acute hemorrhage, mass, mass effect, or midline shift. No hydrocephalus or additional extra-axial collection. Normal pituitary and craniocervical junction. Vascular: Normal arterial flow voids. Normal arterial and venous enhancement. No evidence of venous sinus stenosis or distention. Skull and upper cervical spine: Normal marrow signal. Sinuses/Orbits: Clear paranasal sinuses. No acute finding in the orbits. Other: Fluid in the right mastoid air cells. IMPRESSION: 1. Mild diffuse smooth dural thickening, which is nonspecific but can be seen in the setting of bacterial meningitis. 2. No additional acute intracranial process. These results will be called to the ordering clinician or representative by the Radiologist Assistant, and  communication documented in the PACS or [employer]. Electronically Signed   By: Merilyn Baba M.D.   On: 09/29/2022 22:23   ECHOCARDIOGRAM COMPLETE  Result Date: 09/28/2022     ECHOCARDIOGRAM REPORT   Patient Name:   KHUSHBOO NEGA Date of Exam: 09/28/2022 Medical Rec #:  [id number]          Height:       68.0 in Accession #:    [id number]         Weight:       178.0 lb Date of Birth:  [birth year]          BSA:          1.945 m Patient Age:    48 years           BP:           117/74 mmHg Patient Gender: F                  HR:           71 bpm. Exam Location:  Inpatient Procedure: 2D Echo, Cardiac Doppler, Color Doppler and Strain Analysis Indications:    Bacteremia R78.81  History:        Patient has no prior history of Echocardiogram examinations.                 Migraine.  Sonographer:    Ronny Flurry Referring Phys: [id number] [place]  1. Left ventricular ejection fraction, by estimation, is 60 to 65%. Left ventricular ejection fraction by 3D volume is 63 %. The left ventricle has normal function. The left ventricle has no regional wall motion abnormalities. Left ventricular diastolic  parameters are consistent with Grade I diastolic dysfunction (impaired relaxation).  2. Right ventricular systolic function is normal. The right ventricular size is normal. Tricuspid regurgitation signal is inadequate for assessing PA pressure.  3. The mitral valve is grossly normal. Trivial mitral valve regurgitation. No evidence of mitral stenosis.  4. The aortic valve is grossly normal. Aortic valve regurgitation is not visualized. No aortic stenosis is present.  5. The inferior vena cava is normal in size with greater than 50% respiratory variability, suggesting right atrial pressure of 3 mmHg. Conclusion(s)/Recommendation(s): No evidence of valvular vegetations on this transthoracic echocardiogram. Consider a transesophageal echocardiogram to exclude infective endocarditis if clinically indicated. FINDINGS  Left Ventricle: Left ventricular ejection fraction, by estimation, is 60 to 65%. Left ventricular ejection fraction by 3D volume is 63 %. The left ventricle has normal  function. The left ventricle has no regional wall motion abnormalities. Global longitudinal strain performed but not reported based on interpreter judgement due to suboptimal tracking. The left ventricular internal cavity size was normal in size. There is no left ventricular hypertrophy. Left ventricular diastolic parameters are consistent with Grade I diastolic dysfunction (impaired relaxation). Right Ventricle: The right ventricular size is normal. No increase in right ventricular wall thickness. Right ventricular systolic function is normal. Tricuspid regurgitation signal is inadequate for assessing PA pressure. Left Atrium: Left atrial size was normal in size. Right Atrium: Right atrial size was normal in size. Pericardium: Trivial pericardial effusion is present. Mitral Valve: The mitral valve is grossly normal. Trivial mitral valve regurgitation. No evidence of mitral valve stenosis. Tricuspid Valve: The tricuspid valve is grossly normal. Tricuspid valve regurgitation is trivial. No evidence of tricuspid stenosis. Aortic Valve: The aortic valve is grossly normal. Aortic valve regurgitation is not visualized. No aortic stenosis is present. Aortic  valve mean gradient measures 4.0 mmHg. Aortic valve peak gradient measures 6.8 mmHg. Aortic valve area, by VTI measures 3.04 cm. Pulmonic Valve: The pulmonic valve was grossly normal. Pulmonic valve regurgitation is not visualized. No evidence of pulmonic stenosis. Aorta: The aortic root and ascending aorta are structurally normal, with no evidence of dilitation. Venous: The inferior vena cava is normal in size with greater than 50% respiratory variability, suggesting right atrial pressure of 3 mmHg. IAS/Shunts: The atrial septum is grossly normal.  LEFT VENTRICLE PLAX 2D LVIDd:         4.10 cm         Diastology LVIDs:         2.80 cm         LV e' medial:    6.85 cm/s LV PW:         0.80 cm         LV E/e' medial:  9.8 LV IVS:        1.00 cm         LV e' lateral:    5.55 cm/s LVOT diam:     2.20 cm         LV E/e' lateral: 12.1 LV SV:         88 LV SV Index:   45 LVOT Area:     3.80 cm        3D Volume EF                                LV 3D EF:    Left                                             ventricul                                             ar                                             ejection                                             fraction                                             by 3D                                             volume is                                             63 %.  3D Volume EF:                                3D EF:        63 %                                LV EDV:       183 ml                                LV ESV:       67 ml                                LV SV:        116 ml RIGHT VENTRICLE             IVC RV S prime:     12.40 cm/s  IVC diam: 1.40 cm TAPSE (M-mode): 2.2 cm LEFT ATRIUM           Index        RIGHT ATRIUM           Index LA diam:      2.80 cm 1.44 cm/m   RA Area:     12.60 cm LA Vol (A2C): 43.6 ml 22.42 ml/m  RA Volume:   26.90 ml  13.83 ml/m LA Vol (A4C): 27.9 ml 14.34 ml/m  AORTIC VALVE AV Area (Vmax):    3.18 cm AV Area (Vmean):   2.96 cm AV Area (VTI):     3.04 cm AV Vmax:           130.00 cm/s AV Vmean:          90.000 cm/s AV VTI:            0.291 m AV Peak Grad:      6.8 mmHg AV Mean Grad:      4.0 mmHg LVOT Vmax:         108.67 cm/s LVOT Vmean:        70.133 cm/s LVOT VTI:          0.233 m LVOT/AV VTI ratio: 0.80  AORTA Ao Root diam: 3.40 cm Ao Asc diam:  3.50 cm MITRAL VALVE MV Area (PHT): 3.48 cm    SHUNTS MV Decel Time: 218 msec    Systemic VTI:  0.23 m MV E velocity: 67.40 cm/s  Systemic Diam: 2.20 cm MV A velocity: 85.90 cm/s MV E/A ratio:  0.78 Eleonore Chiquito MD Electronically signed by Eleonore Chiquito MD Signature Date/Time: 09/28/2022/1:33:07 PM    Final     Scheduled Meds:    acetaminophen  1,000 mg Oral Q8H   gabapentin  300 mg Oral TID   pantoprazole  40  mg Oral Daily   sodium chloride flush  3 mL Intravenous Q12H   venlafaxine XR  225 mg Oral Q breakfast    Continuous Infusions:    lactated ringers     nafcillin 12 g in sodium chloride 0.9 % 500 mL continuous infusion 20.8 mL/hr at 09/30/22 0539     LOS: 4 days     Vernell Leep, MD,  FACP, [facility], [facility], [facility], [place]     To  contact the attending provider between 7A-7P or the covering provider during after hours 7P-7A, please log into the web site [email or web address] and access using universal [place] password for that web site. If you do not have the password, please call the hospital operator.  09/30/2022, 1:03 PM

## 2022-09-30 NOTE — Progress Notes (Signed)
Patient ID: Amanda [name], female   DOB: [birth year], 62 y.o.   MRN: [id number] BP 137/74 (BP Location: Left Arm)   Pulse 88   Temp 98 F (36.7 C) (Oral)   Resp 12   Ht '5\' 8"'$  (1.727 m)   Wt 80.7 kg   SpO2 92%   BMI 27.06 kg/m  Alert and oriented x 4 Speech is clear and fluent Moving All extremities well Dressing is clean and dry

## 2022-09-30 NOTE — H&P (View-Only) (Signed)
Patient ID: Amanda [name], female   DOB: 07/05/1961, 62 y.o.   MRN: [id number] BP 137/74 (BP Location: Left Arm)   Pulse 88   Temp 98 F (36.7 C) (Oral)   Resp 12   Ht 5' 8" (1.727 m)   Wt 80.7 kg   SpO2 92%   BMI 27.06 kg/m  Alert and oriented x 4 Speech is clear and fluent Moving All extremities well Dressing is clean and dry 

## 2022-09-30 NOTE — Plan of Care (Signed)

## 2022-09-30 NOTE — Progress Notes (Signed)
    [facility] has been requested to perform a transesophageal echocardiogram on 10/01/2022 for Bacteremia.  After careful review of history and examination, the risks and benefits of transesophageal echocardiogram have been explained including risks of esophageal damage, perforation (1:10,000 risk), bleeding, pharyngeal hematoma as well as other potential complications associated with conscious sedation including aspiration, arrhythmia, respiratory failure and death. Alternatives to treatment were discussed, questions were answered. Patient is willing to proceed.   Reino Bellis, NP-C 09/30/2022 3:23 PM

## 2022-10-01 ENCOUNTER — Encounter (HOSPITAL_COMMUNITY)
Admission: EM | Disposition: A | Discharge status: Home Health | Payer: Self-pay | Source: Home / Self Care | Attending: Internal Medicine

## 2022-10-01 ENCOUNTER — Inpatient Hospital Stay (HOSPITAL_COMMUNITY)
Admit: 2022-10-01 | Discharge: 2022-10-01 | Disposition: A | Discharge status: Home / Self Care | Payer: Medicare Other | Attending: Cardiology | Admitting: Cardiology

## 2022-10-01 ENCOUNTER — Inpatient Hospital Stay (HOSPITAL_COMMUNITY): Payer: Medicare Other | Admitting: Anesthesiology

## 2022-10-01 ENCOUNTER — Encounter (HOSPITAL_COMMUNITY): Payer: Self-pay | Admitting: Internal Medicine

## 2022-10-01 ENCOUNTER — Inpatient Hospital Stay: Payer: Self-pay

## 2022-10-01 DIAGNOSIS — B9561 Methicillin susceptible Staphylococcus aureus infection as the cause of diseases classified elsewhere: Secondary | ICD-10-CM | POA: Diagnosis not present

## 2022-10-01 DIAGNOSIS — G003 Staphylococcal meningitis: Secondary | ICD-10-CM | POA: Diagnosis not present

## 2022-10-01 DIAGNOSIS — R7881 Bacteremia: Secondary | ICD-10-CM | POA: Diagnosis not present

## 2022-10-01 DIAGNOSIS — I361 Nonrheumatic tricuspid (valve) insufficiency: Secondary | ICD-10-CM | POA: Diagnosis not present

## 2022-10-01 HISTORY — PX: TEE WITHOUT CARDIOVERSION: SHX5443

## 2022-10-01 LAB — BASIC METABOLIC PANEL
Anion gap: 12 (ref 5–15)
BUN: 5 mg/dL — ABNORMAL LOW (ref 8–23)
CO2: 21 mmol/L — ABNORMAL LOW (ref 22–32)
Calcium: 8.4 mg/dL — ABNORMAL LOW (ref 8.9–10.3)
Chloride: 100 mmol/L (ref 98–111)
Creatinine, Ser: 0.83 mg/dL (ref 0.44–1.00)
GFR, Estimated: >60 mL/min (ref >60)
Glucose, Bld: 105 mg/dL — ABNORMAL HIGH (ref 70–99)
Potassium: 3 mmol/L — ABNORMAL LOW (ref 3.5–5.1)
Sodium: 133 mmol/L — ABNORMAL LOW (ref 135–145)

## 2022-10-01 LAB — HEPATIC FUNCTION PANEL
ALT: 33 U/L (ref 0–44)
AST: 41 U/L (ref 15–41)
Albumin: 2.4 g/dL — ABNORMAL LOW (ref 3.5–5.0)
Alkaline Phosphatase: 182 U/L — ABNORMAL HIGH (ref 38–126)
Bilirubin, Direct: 0.2 mg/dL (ref 0.0–0.2)
Indirect Bilirubin: 1.5 mg/dL — ABNORMAL HIGH (ref 0.3–0.9)
Total Bilirubin: 1.7 mg/dL — ABNORMAL HIGH (ref 0.3–1.2)
Total Protein: 5.9 g/dL — ABNORMAL LOW (ref 6.5–8.1)

## 2022-10-01 LAB — MAGNESIUM: Magnesium: 1.7 mg/dL (ref 1.7–2.4)

## 2022-10-01 SURGERY — ECHOCARDIOGRAM, TRANSESOPHAGEAL
Anesthesia: Monitor Anesthesia Care

## 2022-10-01 MED ORDER — POTASSIUM CHLORIDE 10 MEQ/100ML IV SOLN
10.0000 meq | INTRAVENOUS | Status: AC
  Administered 2022-10-01 (×4): 10 meq via INTRAVENOUS
  Filled 2022-10-01 (×4): qty 100

## 2022-10-01 MED ORDER — POTASSIUM CHLORIDE 2 MEQ/ML IV SOLN
INTRAVENOUS | Status: AC
  Filled 2022-10-01 (×2): qty 1000

## 2022-10-01 MED ORDER — PROPOFOL 500 MG/50ML IV EMUL
INTRAVENOUS | Status: DC | PRN
  Administered 2022-10-01: 200 ug/kg/min via INTRAVENOUS

## 2022-10-01 MED ORDER — POTASSIUM CHLORIDE 2 MEQ/ML IV SOLN: INTRAVENOUS | Status: DC

## 2022-10-01 MED ORDER — POTASSIUM CHLORIDE 10 MEQ/100ML IV SOLN
10.0000 meq | INTRAVENOUS | Status: DC
  Administered 2022-10-01 (×2): 10 meq via INTRAVENOUS
  Filled 2022-10-01 (×3): qty 100

## 2022-10-01 MED ORDER — POTASSIUM CHLORIDE CRYS ER 20 MEQ PO TBCR: 40.0000 meq | EXTENDED_RELEASE_TABLET | ORAL | Status: DC

## 2022-10-01 MED ORDER — PROPOFOL 10 MG/ML IV BOLUS
INTRAVENOUS | Status: DC | PRN
  Administered 2022-10-01: 60 mg via INTRAVENOUS
  Administered 2022-10-01: 40 mg via INTRAVENOUS

## 2022-10-01 MED ORDER — LIDOCAINE 2% (20 MG/ML) 5 ML SYRINGE
INTRAMUSCULAR | Status: DC | PRN
  Administered 2022-10-01: 60 mg via INTRAVENOUS
  Administered 2022-10-01: 40 mg via INTRAVENOUS

## 2022-10-01 NOTE — Progress Notes (Signed)
[place] [facility]  Date of Admission:  09/26/2022           Reason for visit: Follow up on MSSA bacteremia  Current antibiotics: Nafcillin   ASSESSMENT:    62 y.o. female admitted with:  MSSA bacteremia: In the setting of recent L4-5 microdiscectomy on 09/14/2022 as an outpatient.  Wound drainage reported prior to admission as a potential source with blood cultures positive on 09/26/2022.  Repeat blood cultures obtained 09/28/2022 are currently no growth to date.  She underwent MRI of the lumbar spine on 09/28/2022 which showed a subdural fluid collection from L5 through at least T12 superiorly.  Radiology and neurosurgery thinks this is consistent with hematoma.  TTE and TEE were negative for vegetation. CSF cultures positive for MSSA: She underwent a traumatic lumbar puncture which resulted in 46,000 RBCs.  However, she still had a fairly significant pleocytosis even when accounting for her RBCs.  Glucose and protein abnormal and her CSF cultures are also growing MSSA.  I think given the overall clinical picture, this is consistent with meningitis.  MRI brain had some dural thickening which is nonspecific but may be seen in bacterial meningitis. Encephalopathy/Headaches: This has improved.  RECOMMENDATIONS:    Continue Nafcillin Okay to place PICC line Will follow    Principal Problem:   MSSA bacteremia Active Problems:   Restless legs syndrome (RLS)   Other migraine without status migrainosus, not intractable   Low back pain   Severe sepsis (HCC)   Acute kidney injury ([place])   Surgical site infection   Sepsis with encephalopathy without septic shock (HCC)   Meningitis, staphylococcal    MEDICATIONS:    Scheduled Meds:  acetaminophen  1,000 mg Oral Q8H   gabapentin  300 mg Oral TID   pantoprazole  40 mg Oral Daily   sodium chloride flush  3 mL Intravenous Q12H   venlafaxine XR  225 mg Oral Q breakfast   Continuous Infusions:  sodium chloride 20  mL/hr at 10/01/22 0558   lactated ringers 1,000 mL with potassium chloride 40 mEq infusion 50 mL/hr at 10/01/22 0626   nafcillin 12 g in sodium chloride 0.9 % 500 mL continuous infusion 12 g (09/30/22 1332)   potassium chloride 10 mEq (10/01/22 0743)   PRN Meds:.diazepam, HYDROmorphone (DILAUDID) injection, ibuprofen, ondansetron (ZOFRAN) IV, mouth rinse, oxyCODONE, polyethylene glycol, traZODone  SUBJECTIVE:   24 hour events:  There have been no acute events noted overnight She is afebrile, Tmax 98.1 Sodium and potassium slightly decreased Total bilirubin improved from the day prior MRI brain 3/6 showed some mild smooth dural thickening.  Relatively nonspecific but can be seen in the setting of meningitis.  No other process was noted Per neurosurgery note from yesterday, dressing was clean and dry Patient is down at TEE this morning.  Discussed with her husband.  States incision has been doing well.  States that she has not been up to the chair much and has mostly been lying flat. Headaches were better yesterday.   OBJECTIVE:   Blood pressure 136/85, pulse 97, temperature 98 F (36.7 C), temperature source Oral, resp. rate 15, height '5\' 8"'$  (1.727 m), weight 80.7 kg, SpO2 95 %. Body mass index is 27.06 kg/m.   Lab Results: Lab Results  Component Value Date   WBC 6.0 09/30/2022   HGB 10.8 (L) 09/30/2022   HCT 33.6 (L) 09/30/2022   MCV 87.5 09/30/2022   PLT 240 09/30/2022    Lab  Results  Component Value Date   NA 133 (L) 10/01/2022   K 3.0 (L) 10/01/2022   CO2 21 (L) 10/01/2022   GLUCOSE 105 (H) 10/01/2022   BUN 5 (L) 10/01/2022   CREATININE 0.83 10/01/2022   CALCIUM 8.4 (L) 10/01/2022   GFRNONAA >60 10/01/2022   GFRAA >60 05/25/2016    Lab Results  Component Value Date   ALT 33 10/01/2022   AST 41 10/01/2022   ALKPHOS 182 (H) 10/01/2022   BILITOT 1.7 (H) 10/01/2022    No results found for: "CRP"  No results found for: "ESRSEDRATE"   I have reviewed the micro  and lab results in Epic.  Imaging: MR BRAIN W WO CONTRAST  Result Date: 09/29/2022 CLINICAL DATA:  MSSA bacteremia and headaches, concern for meningitis; recent lumbar spinal surgery EXAM: MRI HEAD WITHOUT AND WITH CONTRAST TECHNIQUE: Multiplanar, multiecho pulse sequences of the brain and surrounding structures were obtained without and with intravenous contrast. CONTRAST:  7.13m GADAVIST GADOBUTROL 1 MMOL/ML IV SOLN COMPARISON:  MRI head 04/19/2016, correlation is also made with CT head 09/27/2022 FINDINGS: Brain: Mild diffuse smooth dural thickening, without definite extension into the sulci. 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 No other abnormal parenchymal or leptomeningeal enhancement. No restricted diffusion to suggest acute or subacute infarct. No acute hemorrhage, mass, mass effect, or midline shift. No hydrocephalus or additional extra-axial collection. Normal pituitary and craniocervical junction. Vascular: Normal arterial flow voids. Normal arterial and venous enhancement. No evidence of venous sinus stenosis or distention. Skull and upper cervical spine: Normal marrow signal. Sinuses/Orbits: Clear paranasal sinuses. No acute finding in the orbits. Other: Fluid in the right mastoid air cells. IMPRESSION: 1. Mild diffuse smooth dural thickening, which is nonspecific but can be seen in the setting of bacterial meningitis. 2. No additional acute intracranial process. These results will be called to the ordering clinician or representative by the Radiologist Assistant, and communication documented in the PACS or [employer] Electronically Signed   By: AMerilyn BabaM.D.   On: 09/29/2022 22:23     Imaging independently reviewed in Epic.    ARaynelle Highlandfor Infectious Disease CThe University HospitalGroup [phone] 10/01/2022, 8:14  AM

## 2022-10-01 NOTE — Progress Notes (Signed)
PROGRESS NOTE   Amanda [name]  [id number]    DOB: 23-Jul-1961    DOA: 09/26/2022  PCP: Carol Ada, MD   I have briefly reviewed patients previous medical records in [facility], [place].  Chief Complaint  Patient presents with   Altered Mental Status   Claudication    Brief Narrative:  62 year old married female, s/p right L4-5 laminotomy and microdiscectomy at the outpatient surgical center on 09/14/2022, unremarkable postop course, developed acute onset of confusion, cramping in her right leg, restlessness and combativeness and brought to the ED on 09/26/2022 where she was noted to have a fever of 105 F.  She was admitted for severe sepsis, acute encephalopathy and acute kidney injury.  Since then she is diagnosed with MSSA bacteremia, suspected bacterial meningitis.  ID and neurosurgery consulted and following.  Ongoing severe headaches, improving.  TEE negative for vegetations.   Assessment & Plan:  Principal Problem:   MSSA bacteremia Active Problems:   Restless legs syndrome (RLS)   Other migraine without status migrainosus, not intractable   Low back pain   Severe sepsis (HCC)   Acute kidney injury [facility])   Surgical site infection   Sepsis with encephalopathy without septic shock (HCC)   Meningitis, staphylococcal   Severe sepsis, secondary to MSSA bacteremia in the setting of recent L4-5 microdiscectomy (09/14/2022) with suspected bacterial meningitis, all POA: Surveillance blood cultures from 3/5: Negative to date.  Sepsis physiology resolved.  ID following.  MSSA bacteremia: ID follow-up appreciated.  Surgical wound infection as a potential source for bacteremia.  Blood cultures from 09/26/2022 positive for MSSA.  Surveillance blood cultures from 09/28/2022: Remain negative to date.  MRI L-spine 3/5 showed subdural fluid collection from L5 through at least T12.  As per radiology and neurosurgery, they feel this is a hematoma.  Continue IV nafcillin.  TTE showed normal EF,  no evidence of vegetations.  TEE negative for vegetations.  Suspected MSSA bacterial meningitis: Difficult and traumatic lumbar puncture.  CSF growing MSSA.  Although ID felt that her L-spine subdural fluid collection on MRI could represent an empyema or infected hematoma, Neurosurgery disagrees and indicated a subdural infection in the lumbar spine is exceedingly rare.  Continue IV nafcillin.  ID plan to place PICC line.  Septic encephalopathy: Secondary to above.  Resolved as per discussion with spouse at bedside  Severe headache: Probably multifactorial related to acute bacterial meningitis, per patient report worsened after the LP and hence CSF leak is a potential.  However per neurosurgery, no evidence of CSF leak.  MRI brain with and without contrast 3/6: Mild diffuse smooth dural thickening, which is nonspecific but can be seen in the setting of bacterial meningitis.  No additional acute intracranial process.  Waxing and waning headache, reportedly was bad again last night but complains that she did not get her pain meds in a timely fashion, better this morning.  Encouraged improved oral intake, and mobilization.  Acute kidney injury Creatinine had peaked to 1.4.  Resolved.  Normocytic anemia: Reportedly chronic.  Follow CBC periodically.  Hypokalemia: Continue to replace aggressively.  Since she was n.p.o. this morning, replacing as IV runs and in IV fluids.  Follow BMP in AM.  Metabolic acidosis has resolved.  Check and replace magnesium as needed.  Leg cramps may be related to hypokalemia versus her RLS.  Depression, migraine, RLS: Continue home dose of gabapentin and Effexor XR.  Constipation: Last BM approximately 5 days ago.  Passing flatus.  Had a single episode  of nonbloody emesis yesterday.  Poor oral intake.  Continues to decline bowel regimen despite not having a BM for the last almost 1 week.  She indicates that this is not unusual for her.  Declines resuming home dose of  Linzess.  Isolated indirect hyperbilirubinemia: Unclear etiology.  Total bilirubin 2.3.  Mildly elevated alkaline phosphatase.  Follow LFTs and a.m. including split bilirubin.  Likely related to subdural hematoma  Body mass index is 27.06 kg/m.    DVT prophylaxis: SCDs Start: 09/26/22 1259     Code Status: Full Code:  ACP Documents: None present Family Communication: Spouse at bedside Disposition:  DC home pending improvement in pain control on p.o. meds alone, improved oral intake, correction of electrolyte abnormalities and mobilization.     Consultants:   Infectious disease Neurosurgery Cardiology   Procedures:   Lumbar puncture TEE 3/8:  LV: normal function and size. LVEF 60-65% RV: normal function and size.  No valvular pathology or intracardiac vegetations identified.   Antimicrobials:   As noted above   Subjective:  Reports that she had a rough night last night, did not get her pain meds in a timely fashion (updated floor charge nurse).  States that the headache was 100/10.  Better this morning after pain meds.  6/10.  Ate some salad for lunch yesterday and did not eat anything for dinner.  Was n.p.o. this morning for TEE and was yet to order a meal.  No BM yet.  No nausea or vomiting.  Feels hungry.  Has not been out of bed.  Objective:   Vitals:   10/01/22 1010 10/01/22 1039 10/01/22 1204 10/01/22 1522  BP: (!) 142/82 139/77  (!) 152/89  Pulse: 93 84 87 90  Resp: '18 14 15 15  '$ Temp:  98.2 F (36.8 C)  98.2 F (36.8 C)  TempSrc:  Oral  Oral  SpO2: 96% 100% 97% 99%  Weight:      Height:        General exam: Middle-age female, moderately built and nourished lying comfortably supine in bed without distress. Respiratory system: Clear to auscultation.  No increased work of breathing. Cardiovascular system: S1 and S2 heard, RRR.  No JVD, murmurs or pedal edema.  Telemetry personally reviewed: Sinus rhythm.   Gastrointestinal system: Abdomen is nondistended,  soft and nontender. No organomegaly or masses felt. Normal bowel sounds heard. Central nervous system: Alert and oriented. No focal neurological deficits. Extremities: Symmetric 5 x 5 power. Skin: No rashes, lesions or ulcers.  As examined on 3/6: Lumbar surgical site examined along with spouse, dressing mildly soaked with dried blood but otherwise no acute findings. Psychiatry: Judgement and insight appear normal. Mood & affect appropriate.     Data Reviewed:   I have personally reviewed following labs and imaging studies   CBC: Recent Labs  Lab 09/26/22 0700 09/27/22 0115 09/30/22 0139  WBC 13.1* 9.8 6.0  NEUTROABS 12.3*  --   --   HGB 10.8* 9.9* 10.8*  HCT 34.2* 29.8* 33.6*  MCV 90.2 87.9 87.5  PLT 348 241 [id number]    Basic Metabolic Panel: Recent Labs  Lab 09/26/22 0700 09/27/22 0115 09/30/22 0139 09/30/22 1405 10/01/22 0129  NA 132* 137 134* 136 133*  K 3.7 4.0 2.9* 3.8 3.0*  CL 99 106 102 104 100  CO2 21* 23 16* 19* 21*  GLUCOSE 159* 112* 73 100* 105*  BUN '13 13 8 '$ 6* 5*  CREATININE 1.40* 1.06* 1.05* 0.97 0.83  CALCIUM 8.6* 8.4* 8.2*  8.3* 8.4*  MG  --   --  1.8  --   --     Liver Function Tests: Recent Labs  Lab 09/26/22 0700 09/27/22 0115 09/30/22 0139 10/01/22 0129  AST 57* 54* 34 41  ALT '31 30 28 '$ 33  ALKPHOS 110 95 178* 182*  BILITOT 0.5 0.5 2.3* 1.7*  PROT 6.4* 5.6* 5.6* 5.9*  ALBUMIN 3.4* 2.5* 2.3* 2.4*    CBG: No results for input(s): "GLUCAP" in the last 168 hours.  Microbiology Studies:   Recent Results (from the past 240 hour(s))  Blood Culture (routine x 2)     Status: Abnormal   Collection Time: 09/26/22  7:00 AM   Specimen: BLOOD  Result Value Ref Range Status   Specimen Description BLOOD RIGHT ANTECUBITAL  Final   Special Requests   Final    BOTTLES DRAWN AEROBIC AND ANAEROBIC Blood Culture adequate volume   Culture  Setup Time   Final    GRAM POSITIVE COCCI IN CLUSTERS IN BOTH AEROBIC AND ANAEROBIC BOTTLES Organism ID to  follow CRITICAL RESULT CALLED TO, READ BACK BY AND VERIFIED WITH: T RUDISILL,PHARMD'@2320'$  09/26/22 [place] Performed at [facility], [address], [place], [place] [address]    Culture STAPHYLOCOCCUS AUREUS (A)  Final   Report Status 09/29/2022 FINAL  Final   Organism ID, Bacteria STAPHYLOCOCCUS AUREUS  Final      Susceptibility   Staphylococcus aureus - MIC*    CIPROFLOXACIN <=0.5 SENSITIVE Sensitive     ERYTHROMYCIN <=0.25 SENSITIVE Sensitive     GENTAMICIN <=0.5 SENSITIVE Sensitive     OXACILLIN 0.5 SENSITIVE Sensitive     TETRACYCLINE <=1 SENSITIVE Sensitive     VANCOMYCIN 1 SENSITIVE Sensitive     TRIMETH/SULFA <=10 SENSITIVE Sensitive     CLINDAMYCIN <=0.25 SENSITIVE Sensitive     RIFAMPIN <=0.5 SENSITIVE Sensitive     Inducible Clindamycin NEGATIVE Sensitive     * STAPHYLOCOCCUS AUREUS  Blood Culture ID Panel (Reflexed)     Status: Abnormal   Collection Time: 09/26/22  7:00 AM  Result Value Ref Range Status   Enterococcus faecalis NOT DETECTED NOT DETECTED Final   Enterococcus Faecium NOT DETECTED NOT DETECTED Final   Listeria monocytogenes NOT DETECTED NOT DETECTED Final   Staphylococcus species DETECTED (A) NOT DETECTED Final    Comment: CRITICAL RESULT CALLED TO, READ BACK BY AND VERIFIED WITH: T RUDISILL,PHARMD'@2320'$  09/26/22 [place]    Staphylococcus aureus (BCID) DETECTED (A) NOT DETECTED Final    Comment: CRITICAL RESULT CALLED TO, READ BACK BY AND VERIFIED WITH: T RUDISILL,PHARMD'@2320'$  09/26/22 [place]    Staphylococcus epidermidis NOT DETECTED NOT DETECTED Final   Staphylococcus lugdunensis NOT DETECTED NOT DETECTED Final   Streptococcus species NOT DETECTED NOT DETECTED Final   Streptococcus agalactiae NOT DETECTED NOT DETECTED Final   Streptococcus pneumoniae NOT DETECTED NOT DETECTED Final   Streptococcus pyogenes NOT DETECTED NOT DETECTED Final   A.calcoaceticus-baumannii NOT DETECTED NOT DETECTED Final   Bacteroides fragilis NOT DETECTED NOT DETECTED Final    Enterobacterales NOT DETECTED NOT DETECTED Final   Enterobacter cloacae complex NOT DETECTED NOT DETECTED Final   Escherichia coli NOT DETECTED NOT DETECTED Final   Klebsiella aerogenes NOT DETECTED NOT DETECTED Final   Klebsiella oxytoca NOT DETECTED NOT DETECTED Final   Klebsiella pneumoniae NOT DETECTED NOT DETECTED Final   Proteus species NOT DETECTED NOT DETECTED Final   Salmonella species NOT DETECTED NOT DETECTED Final   Serratia marcescens NOT DETECTED NOT DETECTED Final   Haemophilus  influenzae NOT DETECTED NOT DETECTED Final   Neisseria meningitidis NOT DETECTED NOT DETECTED Final   Pseudomonas aeruginosa NOT DETECTED NOT DETECTED Final   Stenotrophomonas maltophilia NOT DETECTED NOT DETECTED Final   Candida albicans NOT DETECTED NOT DETECTED Final   Candida auris NOT DETECTED NOT DETECTED Final   Candida glabrata NOT DETECTED NOT DETECTED Final   Candida krusei NOT DETECTED NOT DETECTED Final   Candida parapsilosis NOT DETECTED NOT DETECTED Final   Candida tropicalis NOT DETECTED NOT DETECTED Final   Cryptococcus neoformans/gattii NOT DETECTED NOT DETECTED Final   Meth resistant mecA/C and MREJ NOT DETECTED NOT DETECTED Final    Comment: Performed at [facility], [place] [address], [place], [place] [address]  Blood Culture (routine x 2)     Status: Abnormal   Collection Time: 09/26/22  7:02 AM   Specimen: BLOOD  Result Value Ref Range Status   Specimen Description BLOOD LEFT ANTECUBITAL  Final   Special Requests   Final    BOTTLES DRAWN AEROBIC AND ANAEROBIC Blood Culture adequate volume   Culture  Setup Time   Final    GRAM POSITIVE COCCI IN BOTH AEROBIC AND ANAEROBIC BOTTLES CRITICAL VALUE NOTED.  VALUE IS CONSISTENT WITH PREVIOUSLY REPORTED AND CALLED VALUE.    Culture (A)  Final    STAPHYLOCOCCUS AUREUS SUSCEPTIBILITIES PERFORMED ON PREVIOUS CULTURE WITHIN THE LAST 5 DAYS. Performed at [facility], [place] [address] [place], [place] [address]    Report  Status 09/29/2022 FINAL  Final  Resp panel by RT-PCR (RSV, Flu A&B, Covid) Anterior Nasal Swab     Status: None   Collection Time: 09/26/22 10:14 AM   Specimen: Anterior Nasal Swab  Result Value Ref Range Status   SARS Coronavirus 2 by RT PCR NEGATIVE NEGATIVE Final   Influenza A by PCR NEGATIVE NEGATIVE Final   Influenza B by PCR NEGATIVE NEGATIVE Final    Comment: (NOTE) The Xpert Xpress SARS-CoV-2/FLU/RSV plus assay is intended as an aid in the diagnosis of influenza from Nasopharyngeal swab specimens and should not be used as a sole basis for treatment. Nasal washings and aspirates are unacceptable for Xpert Xpress SARS-CoV-2/FLU/RSV testing.  Fact Sheet for Patients: EntrepreneurPulse.com.au  Fact Sheet for Healthcare Providers: IncredibleEmployment.be  This test is not yet approved or cleared by the [place] FDA and has been authorized for detection and/or diagnosis of SARS-CoV-2 by FDA under an Emergency Use Authorization (EUA). This EUA will remain in effect (meaning this test can be used) for the duration of the COVID-19 declaration under Section 564(b)(1) of the Act, 21 U.S.C. section 360bbb-3(b)(1), unless the authorization is terminated or revoked.     Resp Syncytial Virus by PCR NEGATIVE NEGATIVE Final    Comment: (NOTE) Fact Sheet for Patients: EntrepreneurPulse.com.au  Fact Sheet for Healthcare Providers: IncredibleEmployment.be  This test is not yet approved or cleared by the [place] FDA and has been authorized for detection and/or diagnosis of SARS-CoV-2 by FDA under an Emergency Use Authorization (EUA). This EUA will remain in effect (meaning this test can be used) for the duration of the COVID-19 declaration under Section 564(b)(1) of the Act, 21 U.S.C. section 360bbb-3(b)(1), unless the authorization is terminated or revoked.  Performed at [facility], [place]  [address], [place], [place] [address]   CSF culture w Gram Stain     Status: None   Collection Time: 09/27/22  4:00 PM   Specimen: PATH Cytology CSF; Cerebrospinal Fluid  Result Value  Ref Range Status   Specimen Description CSF  Final   Special Requests NONE  Final   Gram Stain   Final    RARE WBC PRESENT, PREDOMINANTLY PMN RARE GRAM POSITIVE COCCI CRITICAL RESULT CALLED TO, READ BACK BY AND VERIFIED WITH: RN ERIN ROWETTE ON 09/27/22 @ 1757 BY DRT Performed at [facility], [place] [address] [place], [place] [address]    Culture FEW STAPHYLOCOCCUS AUREUS  Final   Report Status 09/30/2022 FINAL  Final   Organism ID, Bacteria STAPHYLOCOCCUS AUREUS  Final      Susceptibility   Staphylococcus aureus - MIC*    CIPROFLOXACIN <=0.5 SENSITIVE Sensitive     ERYTHROMYCIN <=0.25 SENSITIVE Sensitive     GENTAMICIN <=0.5 SENSITIVE Sensitive     OXACILLIN 0.5 SENSITIVE Sensitive     TETRACYCLINE <=1 SENSITIVE Sensitive     VANCOMYCIN 1 SENSITIVE Sensitive     TRIMETH/SULFA <=10 SENSITIVE Sensitive     CLINDAMYCIN <=0.25 SENSITIVE Sensitive     RIFAMPIN <=0.5 SENSITIVE Sensitive     Inducible Clindamycin NEGATIVE Sensitive     * FEW STAPHYLOCOCCUS AUREUS  Anaerobic culture w Gram Stain     Status: None (Preliminary result)   Collection Time: 09/27/22  4:00 PM   Specimen: PATH Cytology CSF; Cerebrospinal Fluid  Result Value Ref Range Status   Specimen Description CSF  Final   Special Requests NONE  Final   Gram Stain   Final    RARE WBC PRESENT, PREDOMINANTLY PMN RARE GRAM POSITIVE COCCI Performed at [facility], [place] [address], [place], [place] [address]    Culture   Final    NO ANAEROBES ISOLATED; CULTURE IN PROGRESS FOR 5 DAYS   Report Status PENDING  Incomplete  Culture, blood (Routine X 2) w Reflex to ID Panel     Status: None (Preliminary result)   Collection Time: 09/28/22  8:10 AM   Specimen: BLOOD  Result Value Ref Range Status   Specimen Description BLOOD RIGHT ANTECUBITAL   Final   Special Requests   Final    BOTTLES DRAWN AEROBIC AND ANAEROBIC Blood Culture adequate volume   Culture   Final    NO GROWTH 3 DAYS Performed at [facility], [place] [address] [place], [place] [address]    Report Status PENDING  Incomplete  Culture, blood (Routine X 2) w Reflex to ID Panel     Status: None (Preliminary result)   Collection Time: 09/28/22  8:21 AM   Specimen: BLOOD RIGHT HAND  Result Value Ref Range Status   Specimen Description BLOOD RIGHT HAND  Final   Special Requests   Final    BOTTLES DRAWN AEROBIC AND ANAEROBIC Blood Culture adequate volume   Culture   Final    NO GROWTH 3 DAYS Performed at [facility], [place] [address], [place], [place] [address]    Report Status PENDING  Incomplete    Radiology Studies:  [place] EKG SITE RITE  Result Date: 10/01/2022 If Site Rite image not attached, placement could not be confirmed due to current cardiac rhythm.  MR BRAIN W WO CONTRAST  Result Date: 09/29/2022 CLINICAL DATA:  MSSA bacteremia and headaches, concern for meningitis; recent lumbar spinal surgery EXAM: MRI HEAD WITHOUT AND WITH CONTRAST TECHNIQUE: Multiplanar, multiecho pulse sequences of the brain and surrounding structures were obtained without and with intravenous contrast. CONTRAST:  7.81m GADAVIST GADOBUTROL 1 MMOL/ML IV SOLN COMPARISON:  MRI head 04/19/2016, correlation is also made with  CT head 09/27/2022 FINDINGS: Brain: Mild diffuse smooth dural thickening, without definite extension into the sulci. 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 No other abnormal parenchymal or leptomeningeal enhancement. No restricted diffusion to suggest acute or subacute infarct. No acute hemorrhage, mass, mass effect, or midline shift. No hydrocephalus or additional extra-axial collection. Normal pituitary and craniocervical junction.  Vascular: Normal arterial flow voids. Normal arterial and venous enhancement. No evidence of venous sinus stenosis or distention. Skull and upper cervical spine: Normal marrow signal. Sinuses/Orbits: Clear paranasal sinuses. No acute finding in the orbits. Other: Fluid in the right mastoid air cells. IMPRESSION: 1. Mild diffuse smooth dural thickening, which is nonspecific but can be seen in the setting of bacterial meningitis. 2. No additional acute intracranial process. These results will be called to the ordering clinician or representative by the Radiologist Assistant, and communication documented in the PACS or [employer]. Electronically Signed   By: Merilyn Baba M.D.   On: 09/29/2022 22:23    Scheduled Meds:    acetaminophen  1,000 mg Oral Q8H   gabapentin  300 mg Oral TID   pantoprazole  40 mg Oral Daily   sodium chloride flush  3 mL Intravenous Q12H   venlafaxine XR  225 mg Oral Q breakfast    Continuous Infusions:    lactated ringers 1,000 mL with potassium chloride 40 mEq infusion 50 mL/hr at 10/01/22 0626   nafcillin 12 g in sodium chloride 0.9 % 500 mL continuous infusion 12 g (10/01/22 1330)   potassium chloride 10 mEq (10/01/22 1515)     LOS: 5 days     Vernell Leep, MD,  FACP, [facility], [facility], [facility], [place]     To contact the attending provider between 7A-7P or the covering provider during after hours 7P-7A, please log into the web site [email or web address] and access using universal [place] password for that web site. If you do not have the password, please call the hospital operator.  10/01/2022, 3:37 PM

## 2022-10-01 NOTE — Anesthesia Preprocedure Evaluation (Signed)
Anesthesia Evaluation  Patient identified by MRN, date of birth, ID band Patient awake    Reviewed: Allergy & Precautions, NPO status , Patient's Chart, lab work & pertinent test results  Airway Mallampati: II  TM Distance: <3 FB Neck ROM: Full    Dental  (+) Teeth Intact, Dental Advisory Given   Pulmonary    breath sounds clear to auscultation       Cardiovascular  Rhythm:Regular Rate:Normal  Echo: 1. Left ventricular ejection fraction, by estimation, is 60 to 65%. Left  ventricular ejection fraction by 3D volume is 63 %. The left ventricle has  normal function. The left ventricle has no regional wall motion  abnormalities. Left ventricular diastolic   parameters are consistent with Grade I diastolic dysfunction (impaired  relaxation).   2. Right ventricular systolic function is normal. The right ventricular  size is normal. Tricuspid regurgitation signal is inadequate for assessing  PA pressure.   3. The mitral valve is grossly normal. Trivial mitral valve  regurgitation. No evidence of mitral stenosis.   4. The aortic valve is grossly normal. Aortic valve regurgitation is not  visualized. No aortic stenosis is present.   5. The inferior vena cava is normal in size with greater than 50%  respiratory variability, suggesting right atrial pressure of 3 mmHg.     Neuro/Psych  Headaches PSYCHIATRIC DISORDERS  Depression       GI/Hepatic negative GI ROS, Neg liver ROS,,,  Endo/Other  negative endocrine ROS    Renal/GU Renal disease     Musculoskeletal negative musculoskeletal ROS (+)    Abdominal   Peds  Hematology negative hematology ROS (+)   Anesthesia Other Findings   Reproductive/Obstetrics                             Anesthesia Physical Anesthesia Plan  ASA: 3  Anesthesia Plan: MAC   Post-op Pain Management: Minimal or no pain anticipated   Induction: Intravenous  PONV Risk  Score and Plan: 0 and Propofol infusion  Airway Management Planned: Natural Airway and Nasal Cannula  Additional Equipment:   Intra-op Plan:   Post-operative Plan:   Informed Consent: I have reviewed the patients History and Physical, chart, labs and discussed the procedure including the risks, benefits and alternatives for the proposed anesthesia with the patient or authorized representative who has indicated his/her understanding and acceptance.       Plan Discussed with: CRNA  Anesthesia Plan Comments:        Anesthesia Quick Evaluation

## 2022-10-01 NOTE — Progress Notes (Signed)
Echocardiogram Echocardiogram Transesophageal has been performed.  Fidel Levy 10/01/2022, 10:10 AM

## 2022-10-01 NOTE — Transfer of Care (Signed)
Immediate Anesthesia Transfer of Care Note  Patient: Sharhonda E Lagrange  Procedure(s) Performed: TRANSESOPHAGEAL ECHOCARDIOGRAM (TEE)  Patient Location: PACU  Anesthesia Type:MAC  Level of Consciousness: awake, alert , and oriented  Airway & Oxygen Therapy: Patient Spontanous Breathing and Patient connected to nasal cannula oxygen  Post-op Assessment: Report given to RN and Post -op Vital signs reviewed and stable  Post vital signs: Reviewed and stable  Last Vitals:  Vitals Value Taken Time  BP    Temp    Pulse 95 10/01/22 1005  Resp 20 10/01/22 1005  SpO2 97 % 10/01/22 1005  Vitals shown include unvalidated device data.  Last Pain:  Vitals:   10/01/22 0858  TempSrc: Oral  PainSc: 6       Patients Stated Pain Goal: 2 ([id number])  Complications: No notable events documented.

## 2022-10-01 NOTE — Progress Notes (Signed)
Patient ID: Amanda [name], female   DOB: 02/17/1961, 62 y.o.   MRN: [id number] BP (!) 152/89 (BP Location: Left Arm)   Pulse 84   Temp 98.2 F (36.8 C) (Oral)   Resp 16   Ht '5\' 8"'$  (1.727 m)   Wt 80.7 kg   SpO2 99%   BMI 27.06 kg/m  Wound dressing saturated this evening. Will just take back to the OR tomorrow to explore the wound. Blood cultures are now negative. Explained rationale to Mrs. Upham.

## 2022-10-01 NOTE — Progress Notes (Signed)
Pt has developed a small amount of serous fluid in dressing site at lumbar incision.  MD notified.

## 2022-10-01 NOTE — Interval H&P Note (Signed)
History and Physical Interval Note:  10/01/2022 9:19 AM  Amanda [name]  has presented today for surgery, with the diagnosis of MRSA MENINGITIS.  The various methods of treatment have been discussed with the patient and family. After consideration of risks, benefits and other options for treatment, the patient has consented to  Procedure(s): TRANSESOPHAGEAL ECHOCARDIOGRAM (TEE) (N/A) as a surgical intervention.  The patient's history has been reviewed, patient examined, no change in status, stable for surgery.  I have reviewed the patient's chart and labs.  Questions were answered to the patient's satisfaction.     [name]

## 2022-10-01 NOTE — CV Procedure (Signed)
   TRANSESOPHAGEAL ECHOCARDIOGRAM GUIDED DIRECT CURRENT CARDIOVERSION  NAME:  Amanda [name]    MRN: [id number] DOB:  16-Aug-1960    ADMIT DATE: 09/26/2022  INDICATIONS: MSSA Bacteremia, rule out endocarditis.   PROCEDURE:   Informed consent was obtained prior to the procedure. The risks, benefits and alternatives for the procedure were discussed and the patient comprehended these risks.  Risks include, but are not limited to, cough, sore throat, vomiting, nausea, somnolence, esophageal and stomach trauma or perforation, bleeding, low blood pressure, aspiration, pneumonia, infection, trauma to the teeth and death.    After a procedural time-out, the oropharynx was anesthetized and the patient was sedated by the anesthesia service. The transesophageal probe was inserted in the esophagus and stomach without difficulty and multiple views were obtained. Sedation by anesthesia.   COMPLICATIONS:    Complications: No complications Patient tolerated procedure well.  KEY FINDINGS:  LV: normal function and size. LVEF 60-65% RV: normal function and size.  No valvular pathology or intracardiac vegetations identified.    Skylah Delauter Advanced Heart Failure 9:53 AM

## 2022-10-02 ENCOUNTER — Inpatient Hospital Stay (HOSPITAL_COMMUNITY): Payer: Medicare Other | Admitting: Certified Registered Nurse Anesthetist

## 2022-10-02 ENCOUNTER — Encounter (HOSPITAL_COMMUNITY)
Admission: EM | Disposition: A | Discharge status: Home Health | Payer: Self-pay | Source: Home / Self Care | Attending: Internal Medicine

## 2022-10-02 ENCOUNTER — Inpatient Hospital Stay (HOSPITAL_COMMUNITY): Payer: Medicare Other

## 2022-10-02 ENCOUNTER — Other Ambulatory Visit: Payer: Self-pay

## 2022-10-02 ENCOUNTER — Encounter (HOSPITAL_COMMUNITY): Payer: Self-pay | Admitting: Internal Medicine

## 2022-10-02 DIAGNOSIS — G9600 Cerebrospinal fluid leak, unspecified: Secondary | ICD-10-CM | POA: Diagnosis not present

## 2022-10-02 DIAGNOSIS — G003 Staphylococcal meningitis: Secondary | ICD-10-CM | POA: Diagnosis not present

## 2022-10-02 DIAGNOSIS — R7881 Bacteremia: Secondary | ICD-10-CM | POA: Diagnosis not present

## 2022-10-02 DIAGNOSIS — S32049A Unspecified fracture of fourth lumbar vertebra, initial encounter for closed fracture: Secondary | ICD-10-CM | POA: Diagnosis not present

## 2022-10-02 DIAGNOSIS — B9561 Methicillin susceptible Staphylococcus aureus infection as the cause of diseases classified elsewhere: Secondary | ICD-10-CM | POA: Diagnosis not present

## 2022-10-02 DIAGNOSIS — S32059A Unspecified fracture of fifth lumbar vertebra, initial encounter for closed fracture: Secondary | ICD-10-CM | POA: Diagnosis not present

## 2022-10-02 DIAGNOSIS — G9609 Other spinal cerebrospinal fluid leak: Secondary | ICD-10-CM | POA: Diagnosis present

## 2022-10-02 HISTORY — PX: WOUND EXPLORATION: SHX6188

## 2022-10-02 HISTORY — PX: MAXIMUM ACCESS (MAS)POSTERIOR LUMBAR INTERBODY FUSION (PLIF) 1 LEVEL: SHX6368

## 2022-10-02 LAB — COMPREHENSIVE METABOLIC PANEL
ALT: 29 U/L (ref 0–44)
AST: 36 U/L (ref 15–41)
Albumin: 2.5 g/dL — ABNORMAL LOW (ref 3.5–5.0)
Alkaline Phosphatase: 156 U/L — ABNORMAL HIGH (ref 38–126)
Anion gap: 11 (ref 5–15)
BUN: <5 mg/dL — ABNORMAL LOW (ref 8–23)
CO2: 22 mmol/L (ref 22–32)
Calcium: 8.4 mg/dL — ABNORMAL LOW (ref 8.9–10.3)
Chloride: 99 mmol/L (ref 98–111)
Creatinine, Ser: 0.72 mg/dL (ref 0.44–1.00)
GFR, Estimated: >60 mL/min (ref >60)
Glucose, Bld: 91 mg/dL (ref 70–99)
Potassium: 3.6 mmol/L (ref 3.5–5.1)
Sodium: 132 mmol/L — ABNORMAL LOW (ref 135–145)
Total Bilirubin: 1.6 mg/dL — ABNORMAL HIGH (ref 0.3–1.2)
Total Protein: 6 g/dL — ABNORMAL LOW (ref 6.5–8.1)

## 2022-10-02 LAB — SURGICAL PCR SCREEN
MRSA, PCR: NEGATIVE
Staphylococcus aureus: NEGATIVE

## 2022-10-02 LAB — ANAEROBIC CULTURE W GRAM STAIN

## 2022-10-02 SURGERY — WOUND EXPLORATION
Anesthesia: General | Site: Back

## 2022-10-02 MED ORDER — DEXAMETHASONE SODIUM PHOSPHATE 10 MG/ML IJ SOLN
INTRAMUSCULAR | Status: DC | PRN
  Administered 2022-10-02: 10 mg via INTRAVENOUS

## 2022-10-02 MED ORDER — ACETAMINOPHEN 10 MG/ML IV SOLN
1000.0000 mg | Freq: Once | INTRAVENOUS | Status: DC | PRN
  Administered 2022-10-02: 1000 mg via INTRAVENOUS

## 2022-10-02 MED ORDER — ACETAMINOPHEN 650 MG RE SUPP: 650.0000 mg | RECTAL | Status: DC | PRN

## 2022-10-02 MED ORDER — FENTANYL CITRATE (PF) 250 MCG/5ML IJ SOLN
INTRAMUSCULAR | Status: AC
  Filled 2022-10-02: qty 5

## 2022-10-02 MED ORDER — AMISULPRIDE (ANTIEMETIC) 5 MG/2ML IV SOLN: 10.0000 mg | Freq: Once | INTRAVENOUS | Status: DC | PRN

## 2022-10-02 MED ORDER — ONDANSETRON HCL 4 MG/2ML IJ SOLN
INTRAMUSCULAR | Status: DC | PRN
  Administered 2022-10-02: 8 mg via INTRAVENOUS

## 2022-10-02 MED ORDER — THROMBIN 20000 UNITS EX SOLR
CUTANEOUS | Status: DC | PRN
  Administered 2022-10-02: 20 mL via TOPICAL

## 2022-10-02 MED ORDER — ACETAMINOPHEN 325 MG PO TABS: 650.0000 mg | ORAL_TABLET | ORAL | Status: DC | PRN

## 2022-10-02 MED ORDER — BUPIVACAINE HCL 0.5 % IJ SOLN
INTRAMUSCULAR | Status: DC | PRN
  Administered 2022-10-02: 20 mL

## 2022-10-02 MED ORDER — FENTANYL CITRATE (PF) 100 MCG/2ML IJ SOLN
INTRAMUSCULAR | Status: AC
  Filled 2022-10-02: qty 2

## 2022-10-02 MED ORDER — FENTANYL CITRATE (PF) 100 MCG/2ML IJ SOLN
25.0000 ug | INTRAMUSCULAR | Status: DC | PRN
  Administered 2022-10-02 (×3): 50 ug via INTRAVENOUS

## 2022-10-02 MED ORDER — SODIUM CHLORIDE 0.9 % IV SOLN: 250.0000 mL | INTRAVENOUS | Status: DC

## 2022-10-02 MED ORDER — SODIUM CHLORIDE 0.9% FLUSH
3.0000 mL | Freq: Two times a day (BID) | INTRAVENOUS | Status: DC
  Administered 2022-10-02 – 2022-10-09 (×11): 3 mL via INTRAVENOUS

## 2022-10-02 MED ORDER — MENTHOL 3 MG MT LOZG: 1.0000 | LOZENGE | OROMUCOSAL | Status: DC | PRN

## 2022-10-02 MED ORDER — LIDOCAINE 2% (20 MG/ML) 5 ML SYRINGE
INTRAMUSCULAR | Status: AC
  Filled 2022-10-02: qty 5

## 2022-10-02 MED ORDER — LACTATED RINGERS IV SOLN: INTRAVENOUS | Status: DC

## 2022-10-02 MED ORDER — ONDANSETRON HCL 4 MG/2ML IJ SOLN
INTRAMUSCULAR | Status: AC
  Filled 2022-10-02: qty 4

## 2022-10-02 MED ORDER — ADHERUS DURAL SEALANT
PACK | TOPICAL | Status: DC | PRN
  Administered 2022-10-02: 1 via TOPICAL

## 2022-10-02 MED ORDER — OXYCODONE HCL 5 MG PO TABS
5.0000 mg | ORAL_TABLET | ORAL | Status: DC | PRN
  Filled 2022-10-02 (×2): qty 1

## 2022-10-02 MED ORDER — OXYCODONE HCL ER 20 MG PO T12A
20.0000 mg | EXTENDED_RELEASE_TABLET | Freq: Two times a day (BID) | ORAL | Status: DC
  Administered 2022-10-02 – 2022-10-09 (×13): 20 mg via ORAL
  Filled 2022-10-02 (×4): qty 1
  Filled 2022-10-02: qty 2
  Filled 2022-10-02: qty 1
  Filled 2022-10-02: qty 2
  Filled 2022-10-02 (×3): qty 1
  Filled 2022-10-02 (×4): qty 2

## 2022-10-02 MED ORDER — LIDOCAINE 2% (20 MG/ML) 5 ML SYRINGE
INTRAMUSCULAR | Status: DC | PRN
  Administered 2022-10-02: 60 mg via INTRAVENOUS

## 2022-10-02 MED ORDER — BUPIVACAINE HCL (PF) 0.5 % IJ SOLN
INTRAMUSCULAR | Status: AC
  Filled 2022-10-02: qty 30

## 2022-10-02 MED ORDER — DEXAMETHASONE SODIUM PHOSPHATE 10 MG/ML IJ SOLN
INTRAMUSCULAR | Status: AC
  Filled 2022-10-02: qty 1

## 2022-10-02 MED ORDER — GABAPENTIN 300 MG PO CAPS
900.0000 mg | ORAL_CAPSULE | Freq: Every day | ORAL | Status: DC
  Administered 2022-10-03 – 2022-10-08 (×6): 900 mg via ORAL
  Filled 2022-10-02 (×7): qty 3

## 2022-10-02 MED ORDER — PROPOFOL 500 MG/50ML IV EMUL
INTRAVENOUS | Status: DC | PRN
  Administered 2022-10-02: 75 ug/kg/min via INTRAVENOUS

## 2022-10-02 MED ORDER — PHENYLEPHRINE HCL-NACL 20-0.9 MG/250ML-% IV SOLN
INTRAVENOUS | Status: DC | PRN
  Administered 2022-10-02: 20 ug/min via INTRAVENOUS

## 2022-10-02 MED ORDER — SODIUM CHLORIDE 0.9% FLUSH
3.0000 mL | INTRAVENOUS | Status: DC | PRN
  Administered 2022-10-07: 3 mL via INTRAVENOUS

## 2022-10-02 MED ORDER — BACITRACIN ZINC 500 UNIT/GM EX OINT
TOPICAL_OINTMENT | CUTANEOUS | Status: DC | PRN
  Administered 2022-10-02: 1 via TOPICAL

## 2022-10-02 MED ORDER — OXYCODONE HCL 5 MG PO TABS
5.0000 mg | ORAL_TABLET | ORAL | Status: DC | PRN
  Administered 2022-10-02: 10 mg via ORAL
  Filled 2022-10-02: qty 2

## 2022-10-02 MED ORDER — ROCURONIUM BROMIDE 10 MG/ML (PF) SYRINGE
PREFILLED_SYRINGE | INTRAVENOUS | Status: AC
  Filled 2022-10-02: qty 20

## 2022-10-02 MED ORDER — VENLAFAXINE HCL ER 75 MG PO CP24
225.0000 mg | ORAL_CAPSULE | Freq: Every day | ORAL | Status: DC
  Administered 2022-10-03 – 2022-10-08 (×6): 225 mg via ORAL
  Filled 2022-10-02: qty 3
  Filled 2022-10-02 (×2): qty 1
  Filled 2022-10-02: qty 3
  Filled 2022-10-02 (×2): qty 1
  Filled 2022-10-02: qty 3

## 2022-10-02 MED ORDER — FENTANYL CITRATE (PF) 250 MCG/5ML IJ SOLN
INTRAMUSCULAR | Status: DC | PRN
  Administered 2022-10-02 (×3): 50 ug via INTRAVENOUS
  Administered 2022-10-02 (×2): 100 ug via INTRAVENOUS
  Administered 2022-10-02 (×2): 50 ug via INTRAVENOUS

## 2022-10-02 MED ORDER — 0.9 % SODIUM CHLORIDE (POUR BTL) OPTIME
TOPICAL | Status: DC | PRN
  Administered 2022-10-02: 1000 mL

## 2022-10-02 MED ORDER — POTASSIUM CHLORIDE 2 MEQ/ML IV SOLN
INTRAVENOUS | Status: AC
  Filled 2022-10-02 (×5): qty 1000

## 2022-10-02 MED ORDER — CHLORHEXIDINE GLUCONATE 0.12 % MT SOLN
15.0000 mL | Freq: Once | OROMUCOSAL | Status: AC
  Administered 2022-10-02: 15 mL via OROMUCOSAL

## 2022-10-02 MED ORDER — PROPOFOL 10 MG/ML IV BOLUS
INTRAVENOUS | Status: DC | PRN
  Administered 2022-10-02: 150 mg via INTRAVENOUS

## 2022-10-02 MED ORDER — SODIUM CHLORIDE 0.9 % IV SOLN
12.5000 mg | Freq: Four times a day (QID) | INTRAVENOUS | Status: DC | PRN
  Administered 2022-10-02: 12.5 mg via INTRAVENOUS
  Filled 2022-10-02: qty 0.5

## 2022-10-02 MED ORDER — THROMBIN 20000 UNITS EX SOLR
CUTANEOUS | Status: AC
  Filled 2022-10-02: qty 20000

## 2022-10-02 MED ORDER — OXYCODONE HCL ER 15 MG PO T12A: 15.0000 mg | EXTENDED_RELEASE_TABLET | Freq: Two times a day (BID) | ORAL | Status: DC

## 2022-10-02 MED ORDER — ONDANSETRON HCL 4 MG/2ML IJ SOLN: 4.0000 mg | Freq: Once | INTRAMUSCULAR | Status: DC | PRN

## 2022-10-02 MED ORDER — PHENOL 1.4 % MT LIQD: 1.0000 | OROMUCOSAL | Status: DC | PRN

## 2022-10-02 MED ORDER — CHLORHEXIDINE GLUCONATE 0.12 % MT SOLN
OROMUCOSAL | Status: AC
  Filled 2022-10-02: qty 15

## 2022-10-02 MED ORDER — ROCURONIUM BROMIDE 10 MG/ML (PF) SYRINGE
PREFILLED_SYRINGE | INTRAVENOUS | Status: DC | PRN
  Administered 2022-10-02: 20 mg via INTRAVENOUS
  Administered 2022-10-02: 30 mg via INTRAVENOUS
  Administered 2022-10-02: 20 mg via INTRAVENOUS
  Administered 2022-10-02: 50 mg via INTRAVENOUS

## 2022-10-02 MED ORDER — SUGAMMADEX SODIUM 200 MG/2ML IV SOLN
INTRAVENOUS | Status: DC | PRN
  Administered 2022-10-02: 161.4 mg via INTRAVENOUS

## 2022-10-02 MED ORDER — BACITRACIN ZINC 500 UNIT/GM EX OINT
TOPICAL_OINTMENT | CUTANEOUS | Status: AC
  Filled 2022-10-02: qty 28.35

## 2022-10-02 MED ORDER — ORAL CARE MOUTH RINSE: 15.0000 mL | Freq: Once | OROMUCOSAL | Status: AC

## 2022-10-02 MED ORDER — OXYCODONE HCL 5 MG PO TABS
10.0000 mg | ORAL_TABLET | ORAL | Status: DC | PRN
  Administered 2022-10-02 – 2022-10-09 (×21): 10 mg via ORAL
  Filled 2022-10-02 (×20): qty 2

## 2022-10-02 SURGICAL SUPPLY — 51 items
BAG COUNTER SPONGE SURGICOUNT (BAG) ×2 IMPLANT
BAND RUBBER #18 3X1/16 STRL (MISCELLANEOUS) IMPLANT
BENZOIN TINCTURE PRP APPL 2/3 (GAUZE/BANDAGES/DRESSINGS) ×2 IMPLANT
BLADE CLIPPER SURG (BLADE) IMPLANT
BUR CARBIDE MATCH 3.0 (BURR) IMPLANT
CAGE CONVEX CASCADIA 8.5X22X10 (Cage) IMPLANT
CANISTER SUCT 3000ML PPV (MISCELLANEOUS) ×2 IMPLANT
COVER BACK TABLE 60X90IN (DRAPES) IMPLANT
DRAPE C-ARM 42X120 X-RAY (DRAPES) IMPLANT
DRAPE C-ARMOR (DRAPES) IMPLANT
DRAPE LAPAROTOMY 100X72 PEDS (DRAPES) IMPLANT
DRAPE LAPAROTOMY 100X72X124 (DRAPES) IMPLANT
DRAPE MICROSCOPE LEICA (MISCELLANEOUS) IMPLANT
DRAPE SURG 17X23 STRL (DRAPES) ×8 IMPLANT
DRSG OPSITE POSTOP 3X4 (GAUZE/BANDAGES/DRESSINGS) IMPLANT
DRSG OPSITE POSTOP 4X6 (GAUZE/BANDAGES/DRESSINGS) IMPLANT
ELECT REM PT RETURN 9FT ADLT (ELECTROSURGICAL) ×2
ELECTRODE REM PT RTRN 9FT ADLT (ELECTROSURGICAL) ×2 IMPLANT
GAUZE 4X4 16PLY ~~LOC~~+RFID DBL (SPONGE) IMPLANT
GAUZE SPONGE 4X4 12PLY STRL (GAUZE/BANDAGES/DRESSINGS) ×2 IMPLANT
GLOVE BIO SURGEON STRL SZ 6.5 (GLOVE) IMPLANT
GLOVE BIO SURGEON STRL SZ7 (GLOVE) IMPLANT
GLOVE ECLIPSE 6.5 STRL STRAW (GLOVE) ×2 IMPLANT
GLOVE EXAM NITRILE XL STR (GLOVE) IMPLANT
GOWN STRL REUS W/ TWL LRG LVL3 (GOWN DISPOSABLE) IMPLANT
GOWN STRL REUS W/ TWL XL LVL3 (GOWN DISPOSABLE) IMPLANT
GOWN STRL REUS W/TWL LRG LVL3 (GOWN DISPOSABLE) ×10
GOWN STRL REUS W/TWL XL LVL3 (GOWN DISPOSABLE)
GRAFT DURAGEN MATRIX 1WX1L (Tissue) IMPLANT
KIT BASIN OR (CUSTOM PROCEDURE TRAY) ×2 IMPLANT
KIT TURNOVER KIT B (KITS) ×2 IMPLANT
NEEDLE HYPO 22GX1.5 SAFETY (NEEDLE) IMPLANT
NS IRRIG 1000ML POUR BTL (IV SOLUTION) ×2 IMPLANT
PACK LAMINECTOMY NEURO (CUSTOM PROCEDURE TRAY) ×2 IMPLANT
PAD ARMBOARD 7.5X6 YLW CONV (MISCELLANEOUS) ×6 IMPLANT
ROD RADIUS 35MM (Rod) IMPLANT
SCREW PA EVEREST 6.5X30 (Screw) IMPLANT
SET SCREW (Screw) ×4 IMPLANT
SET SCREW VRST (Screw) IMPLANT
STRIP CLOSURE SKIN 1/2X4 (GAUZE/BANDAGES/DRESSINGS) ×2 IMPLANT
SUT ETHILON 3 0 FSL (SUTURE) IMPLANT
SUT PROLENE 6 0 BV (SUTURE) IMPLANT
SUT VIC AB 1 CT1 18XBRD ANBCTR (SUTURE) ×2 IMPLANT
SUT VIC AB 1 CT1 8-18 (SUTURE) ×4
SUT VIC AB 2-0 CP2 18 (SUTURE) ×2 IMPLANT
SUT VIC AB 2-0 CT1 18 (SUTURE) IMPLANT
SWAB COLLECTION DEVICE MRSA (MISCELLANEOUS) IMPLANT
SWAB CULTURE ESWAB REG 1ML (MISCELLANEOUS) IMPLANT
TOWEL GREEN STERILE (TOWEL DISPOSABLE) ×2 IMPLANT
TOWEL GREEN STERILE FF (TOWEL DISPOSABLE) ×2 IMPLANT
WATER STERILE IRR 1000ML POUR (IV SOLUTION) ×2 IMPLANT

## 2022-10-02 NOTE — Progress Notes (Addendum)
PICC consult: Risks and benefits reviewed with pt and husband at bedside. PICC consent obtained. Pt reports wound exploration surgery is scheduled for this afternoon. Clinical decision was made to delay PICC insertion until after surgery. Pt has adequate access. PICC team will reassess on 3/10.

## 2022-10-02 NOTE — Progress Notes (Signed)
PROGRESS NOTE   Amanda [name]  [id number]    DOB: 1961/05/02    DOA: 09/26/2022  PCP: Carol Ada, MD   I have briefly reviewed patients previous medical [facility] in [facility].  Chief Complaint  Patient presents with   Altered Mental Status   Claudication    Brief Narrative:  62 year old married female, s/p right L4-5 laminotomy and microdiscectomy at the outpatient surgical center on 09/14/2022, unremarkable postop course, developed acute onset of confusion, cramping in her right leg, restlessness and combativeness and brought to the ED on 09/26/2022 where she was noted to have a fever of 105 F.  She was admitted for severe sepsis, acute encephalopathy and acute kidney injury.  Since then she is diagnosed with MSSA bacteremia, suspected bacterial meningitis.  ID and neurosurgery consulted and following.  Ongoing severe headaches, improving.  TEE negative for vegetations.  Neurosurgery plans OR 3/9 for exploration of lumbar postop wound.   Assessment & Plan:  Principal Problem:   MSSA bacteremia Active Problems:   Restless legs syndrome (RLS)   Other migraine without status migrainosus, not intractable   Low back pain   Severe sepsis (HCC)   Acute kidney injury [facility])   Surgical site infection   Sepsis with encephalopathy without septic shock ([place])   Meningitis, staphylococcal   Severe sepsis, secondary to MSSA bacteremia in the setting of recent L4-5 microdiscectomy (09/14/2022) with suspected bacterial meningitis, all POA: Surveillance blood cultures from 3/5: Negative to date after 4 days.  Sepsis physiology resolved.  ID following.  MSSA bacteremia: ID follow-up appreciated.  Surgical wound infection as a potential source for bacteremia.  Blood cultures from 09/26/2022 positive for MSSA.  Surveillance blood cultures from 09/28/2022: Remain negative to date after 4 days.  MRI L-spine 3/5 showed subdural fluid collection from L5 through at least T12.  As per radiology  and neurosurgery, they feel this is a hematoma.  Continue IV nafcillin.  TTE showed normal EF, no evidence of vegetations.  TEE negative for vegetations. As per neurosurgery follow-up, wound draining again since 3/8 and they plan to explore the wound in the OR on 3/9.  Suspected MSSA bacterial meningitis: Difficult and traumatic lumbar puncture.  CSF growing MSSA.  Although ID felt that her L-spine subdural fluid collection on MRI could represent an empyema or infected hematoma, Neurosurgery disagrees and indicated a subdural infection in the lumbar spine is exceedingly rare.  Continue IV nafcillin.  ID plan to place PICC line.  As per PICC team, line will be placed postop because patient was anxious and claustrophobic this morning.  Septic encephalopathy: Secondary to above.  Resolved.  Severe headache: Probably multifactorial related to acute bacterial meningitis, per patient report worsened after the LP and hence CSF leak is a potential.  However per neurosurgery, no evidence of CSF leak.  MRI brain with and without contrast 3/6: Mild diffuse smooth dural thickening, which is nonspecific but can be seen in the setting of bacterial meningitis.  No additional acute intracranial process.  Although headache is better compared to earlier, has waxing and waning severity.  At her request, increased oxycodone as needed dose.  Acute kidney injury Creatinine had peaked to 1.4.  Resolved.  Normocytic anemia: Reportedly chronic.  Follow CBC periodically.  Hypokalemia: Better.  Continue to follow and replace as needed.  Replace low normal magnesium.  May be her cramps are related to electrolyte abnormalities.  She says that these are distinctly different from her RLS.  Depression, migraine, RLS:  Continue home dose of gabapentin (reportedly takes 900 mg at bedtime).  And Effexor XR.  Constipation: Last BM approximately 5 days ago.  Passing flatus.  Had a single episode of nonbloody emesis yesterday.   Poor oral intake.  Continues to decline bowel regimen despite not having a BM for the last almost 1 week.  She indicates that this is not unusual for her.  Declines resuming home dose of Linzess.  Isolated indirect hyperbilirubinemia: Unclear etiology.  Total bilirubin 2.3.  Has decreased.  Most likely due to resorption of blood products from subdural hematoma.  Body mass index is 27.07 kg/m.    DVT prophylaxis: SCD's Start: 10/02/22 0736 SCDs Start: 09/26/22 1259     Code Status: Full Code:  ACP Documents: None present Family Communication: Spouse at bedside Disposition:  Pending postop improvement     Consultants:   Infectious disease Neurosurgery Cardiology   Procedures:   Lumbar puncture TEE 3/8:  LV: normal function and size. LVEF 60-65% RV: normal function and size.  No valvular pathology or intracardiac vegetations identified.   Antimicrobials:   As noted above   Subjective:  As I walked into the room this morning, patient was irate and started yelling at me.  Depending why I had stopped her IV fluids and did I not know that she was having cramping, severe pains and was made n.p.o. by Dr. Christella Noa after his visit around 7 PM last night.  It appears that her IV fluids had stopped just an hour prior to my arrival.  Continue to express her dissatisfaction about multiple aspects of her care (as she has done almost on a daily basis), this is despite me addressing most of them item wise on a daily basis, with her in the presence of her husband.  Wanted to know why she was not getting her Effexor (reviewed chart and advised her that she had gotten it over the last 2 days), wanted it changed to at bedtime like she was taking pta.  Wanted her gabapentin changed from 3 times daily to nightly like she was taking at home.  Insisted on IV fluids being resumed.  Spouse was present during much of this interaction.  Objective:   Vitals:   10/02/22 0848 10/02/22 0959 10/02/22 1155  10/02/22 1208  BP:  (!) 150/88 (!) 155/81   Pulse:      Resp: 14  18   Temp:  98.1 F (36.7 C) 98.5 F (36.9 C)   TempSrc:  Oral Oral   SpO2:   97%   Weight:    80.7 kg  Height:    5' 7.99" (1.727 m)    General exam: Middle-age female, moderately built and nourished lying in bed, did not appear in any physical distress. Respiratory system: Clear to auscultation.  No increased work of breathing. Cardiovascular system: S1 and S2 heard, RRR.  No JVD, murmurs or pedal edema.  Telemetry personally reviewed: Sinus rhythm. Gastrointestinal system: Abdomen is nondistended, soft and nontender. No organomegaly or masses felt. Normal bowel sounds heard. Central nervous system: Alert and oriented. No focal neurological deficits. Extremities: Symmetric 5 x 5 power. Skin: No rashes, lesions or ulcers.  As examined on 3/6: Lumbar surgical site examined along with spouse, dressing mildly soaked with dried blood but otherwise no acute findings. Psychiatry: Judgement and insight appear somewhat impaired. Mood & affect angry and accusatory.    Data Reviewed:   I have personally reviewed following labs and imaging studies   CBC: Recent Labs  Lab 09/26/22 0700 09/27/22 0115 09/30/22 0139  WBC 13.1* 9.8 6.0  NEUTROABS 12.3*  --   --   HGB 10.8* 9.9* 10.8*  HCT 34.2* 29.8* 33.6*  MCV 90.2 87.9 87.5  PLT 348 241 [id number]    Basic Metabolic Panel: Recent Labs  Lab 09/27/22 0115 09/30/22 0139 09/30/22 1405 10/01/22 0129 10/01/22 0145 10/02/22 0134  NA 137 134* 136 133*  --  132*  K 4.0 2.9* 3.8 3.0*  --  3.6  CL 106 102 104 100  --  99  CO2 23 16* 19* 21*  --  22  GLUCOSE 112* 73 100* 105*  --  91  BUN 13 8 6* 5*  --  <5*  CREATININE 1.06* 1.05* 0.97 0.83  --  0.72  CALCIUM 8.4* 8.2* 8.3* 8.4*  --  8.4*  MG  --  1.8  --   --  1.7  --     Liver Function Tests: Recent Labs  Lab 09/26/22 0700 09/27/22 0115 09/30/22 0139 10/01/22 0129 10/02/22 0134  AST 57* 54* 34 41 36  ALT '31 30  28 '$ 33 29  ALKPHOS 110 95 178* 182* 156*  BILITOT 0.5 0.5 2.3* 1.7* 1.6*  PROT 6.4* 5.6* 5.6* 5.9* 6.0*  ALBUMIN 3.4* 2.5* 2.3* 2.4* 2.5*    CBG: No results for input(s): "GLUCAP" in the last 168 hours.  Microbiology Studies:   Recent Results (from the past 240 hour(s))  Blood Culture (routine x 2)     Status: Abnormal   Collection Time: 09/26/22  7:00 AM   Specimen: BLOOD  Result Value Ref Range Status   Specimen Description BLOOD RIGHT ANTECUBITAL  Final   Special Requests   Final    BOTTLES DRAWN AEROBIC AND ANAEROBIC Blood Culture adequate volume   Culture  Setup Time   Final    GRAM POSITIVE COCCI IN CLUSTERS IN BOTH AEROBIC AND ANAEROBIC BOTTLES Organism ID to follow CRITICAL RESULT CALLED TO, READ BACK BY AND VERIFIED WITH: T RUDISILL,PHARMD'@2320'$  09/26/22 [place] Performed at [facility], [address], [place], [place] [address]    Culture STAPHYLOCOCCUS AUREUS (A)  Final   Report Status 09/29/2022 FINAL  Final   Organism ID, Bacteria STAPHYLOCOCCUS AUREUS  Final      Susceptibility   Staphylococcus aureus - MIC*    CIPROFLOXACIN <=0.5 SENSITIVE Sensitive     ERYTHROMYCIN <=0.25 SENSITIVE Sensitive     GENTAMICIN <=0.5 SENSITIVE Sensitive     OXACILLIN 0.5 SENSITIVE Sensitive     TETRACYCLINE <=1 SENSITIVE Sensitive     VANCOMYCIN 1 SENSITIVE Sensitive     TRIMETH/SULFA <=10 SENSITIVE Sensitive     CLINDAMYCIN <=0.25 SENSITIVE Sensitive     RIFAMPIN <=0.5 SENSITIVE Sensitive     Inducible Clindamycin NEGATIVE Sensitive     * STAPHYLOCOCCUS AUREUS  Blood Culture ID Panel (Reflexed)     Status: Abnormal   Collection Time: 09/26/22  7:00 AM  Result Value Ref Range Status   Enterococcus faecalis NOT DETECTED NOT DETECTED Final   Enterococcus Faecium NOT DETECTED NOT DETECTED Final   Listeria monocytogenes NOT DETECTED NOT DETECTED Final   Staphylococcus species DETECTED (A) NOT DETECTED Final    Comment: CRITICAL RESULT CALLED TO, READ BACK BY AND VERIFIED  WITH: T RUDISILL,PHARMD'@2320'$  09/26/22 [place]    Staphylococcus aureus (BCID) DETECTED (A) NOT DETECTED Final    Comment: CRITICAL RESULT CALLED TO, READ BACK BY AND VERIFIED WITH: T RUDISILL,PHARMD'@2320'$  09/26/22 [place]    Staphylococcus epidermidis NOT  DETECTED NOT DETECTED Final   Staphylococcus lugdunensis NOT DETECTED NOT DETECTED Final   Streptococcus species NOT DETECTED NOT DETECTED Final   Streptococcus agalactiae NOT DETECTED NOT DETECTED Final   Streptococcus pneumoniae NOT DETECTED NOT DETECTED Final   Streptococcus pyogenes NOT DETECTED NOT DETECTED Final   A.calcoaceticus-baumannii NOT DETECTED NOT DETECTED Final   Bacteroides fragilis NOT DETECTED NOT DETECTED Final   Enterobacterales NOT DETECTED NOT DETECTED Final   Enterobacter cloacae complex NOT DETECTED NOT DETECTED Final   Escherichia coli NOT DETECTED NOT DETECTED Final   Klebsiella aerogenes NOT DETECTED NOT DETECTED Final   Klebsiella oxytoca NOT DETECTED NOT DETECTED Final   Klebsiella pneumoniae NOT DETECTED NOT DETECTED Final   Proteus species NOT DETECTED NOT DETECTED Final   Salmonella species NOT DETECTED NOT DETECTED Final   Serratia marcescens NOT DETECTED NOT DETECTED Final   Haemophilus influenzae NOT DETECTED NOT DETECTED Final   Neisseria meningitidis NOT DETECTED NOT DETECTED Final   Pseudomonas aeruginosa NOT DETECTED NOT DETECTED Final   Stenotrophomonas maltophilia NOT DETECTED NOT DETECTED Final   Candida albicans NOT DETECTED NOT DETECTED Final   Candida auris NOT DETECTED NOT DETECTED Final   Candida glabrata NOT DETECTED NOT DETECTED Final   Candida krusei NOT DETECTED NOT DETECTED Final   Candida parapsilosis NOT DETECTED NOT DETECTED Final   Candida tropicalis NOT DETECTED NOT DETECTED Final   Cryptococcus neoformans/gattii NOT DETECTED NOT DETECTED Final   Meth resistant mecA/C and MREJ NOT DETECTED NOT DETECTED Final    Comment: Performed at [facility], [address],  [place], [place] [address]  Blood Culture (routine x 2)     Status: Abnormal   Collection Time: 09/26/22  7:02 AM   Specimen: BLOOD  Result Value Ref Range Status   Specimen Description BLOOD LEFT ANTECUBITAL  Final   Special Requests   Final    BOTTLES DRAWN AEROBIC AND ANAEROBIC Blood Culture adequate volume   Culture  Setup Time   Final    GRAM POSITIVE COCCI IN BOTH AEROBIC AND ANAEROBIC BOTTLES CRITICAL VALUE NOTED.  VALUE IS CONSISTENT WITH PREVIOUSLY REPORTED AND CALLED VALUE.    Culture (A)  Final    STAPHYLOCOCCUS AUREUS SUSCEPTIBILITIES PERFORMED ON PREVIOUS CULTURE WITHIN THE LAST 5 DAYS. Performed at [facility], [place] [address] [place], [place] [address]    Report Status 09/29/2022 FINAL  Final  Resp panel by RT-PCR (RSV, Flu A&B, Covid) Anterior Nasal Swab     Status: None   Collection Time: 09/26/22 10:14 AM   Specimen: Anterior Nasal Swab  Result Value Ref Range Status   SARS Coronavirus 2 by RT PCR NEGATIVE NEGATIVE Final   Influenza A by PCR NEGATIVE NEGATIVE Final   Influenza B by PCR NEGATIVE NEGATIVE Final    Comment: (NOTE) The Xpert Xpress SARS-CoV-2/FLU/RSV plus assay is intended as an aid in the diagnosis of influenza from Nasopharyngeal swab specimens and should not be used as a sole basis for treatment. Nasal washings and aspirates are unacceptable for Xpert Xpress SARS-CoV-2/FLU/RSV testing.  Fact Sheet for Patients: EntrepreneurPulse.com.au  Fact Sheet for Healthcare Providers: IncredibleEmployment.be  This test is not yet approved or cleared by the [place] FDA and has been authorized for detection and/or diagnosis of SARS-CoV-2 by FDA under an Emergency Use Authorization (EUA). This EUA will remain in effect (meaning this test can be used) for the duration of the COVID-19 declaration under Section 564(b)(1) of the Act, 21 U.S.C. section 360bbb-3(b)(1), unless  the authorization is terminated  or revoked.     Resp Syncytial Virus by PCR NEGATIVE NEGATIVE Final    Comment: (NOTE) Fact Sheet for Patients: EntrepreneurPulse.com.au  Fact Sheet for Healthcare Providers: IncredibleEmployment.be  This test is not yet approved or cleared by the [place] FDA and has been authorized for detection and/or diagnosis of SARS-CoV-2 by FDA under an Emergency Use Authorization (EUA). This EUA will remain in effect (meaning this test can be used) for the duration of the COVID-19 declaration under Section 564(b)(1) of the Act, 21 U.S.C. section 360bbb-3(b)(1), unless the authorization is terminated or revoked.  Performed at [facility], [place] [address] [place], [place] [address]   CSF culture w Gram Stain     Status: None   Collection Time: 09/27/22  4:00 PM   Specimen: PATH Cytology CSF; Cerebrospinal Fluid  Result Value Ref Range Status   Specimen Description CSF  Final   Special Requests NONE  Final   Gram Stain   Final    RARE WBC PRESENT, PREDOMINANTLY PMN RARE GRAM POSITIVE COCCI CRITICAL RESULT CALLED TO, READ BACK BY AND VERIFIED WITH: RN ERIN ROWETTE ON 09/27/22 @ 1757 BY DRT Performed at [facility], [place] [address] [place], [place] [address]    Culture FEW STAPHYLOCOCCUS AUREUS  Final   Report Status 09/30/2022 FINAL  Final   Organism ID, Bacteria STAPHYLOCOCCUS AUREUS  Final      Susceptibility   Staphylococcus aureus - MIC*    CIPROFLOXACIN <=0.5 SENSITIVE Sensitive     ERYTHROMYCIN <=0.25 SENSITIVE Sensitive     GENTAMICIN <=0.5 SENSITIVE Sensitive     OXACILLIN 0.5 SENSITIVE Sensitive     TETRACYCLINE <=1 SENSITIVE Sensitive     VANCOMYCIN 1 SENSITIVE Sensitive     TRIMETH/SULFA <=10 SENSITIVE Sensitive     CLINDAMYCIN <=0.25 SENSITIVE Sensitive     RIFAMPIN <=0.5 SENSITIVE Sensitive     Inducible Clindamycin NEGATIVE Sensitive     * FEW STAPHYLOCOCCUS AUREUS  Anaerobic culture w Gram Stain     Status: None  (Preliminary result)   Collection Time: 09/27/22  4:00 PM   Specimen: PATH Cytology CSF; Cerebrospinal Fluid  Result Value Ref Range Status   Specimen Description CSF  Final   Special Requests NONE  Final   Gram Stain   Final    RARE WBC PRESENT, PREDOMINANTLY PMN RARE GRAM POSITIVE COCCI Performed at [facility], [place] [address] [place], [place] [address]    Culture   Final    NO ANAEROBES ISOLATED; CULTURE IN PROGRESS FOR 5 DAYS   Report Status PENDING  Incomplete  Culture, blood (Routine X 2) w Reflex to ID Panel     Status: None (Preliminary result)   Collection Time: 09/28/22  8:10 AM   Specimen: BLOOD  Result Value Ref Range Status   Specimen Description BLOOD RIGHT ANTECUBITAL  Final   Special Requests   Final    BOTTLES DRAWN AEROBIC AND ANAEROBIC Blood Culture adequate volume   Culture   Final    NO GROWTH 4 DAYS Performed at [facility], [place] [address], [place], [place] [address]    Report Status PENDING  Incomplete  Culture, blood (Routine X 2) w Reflex to ID Panel     Status: None (Preliminary result)   Collection Time: 09/28/22  8:21 AM   Specimen: BLOOD RIGHT HAND  Result Value Ref Range Status   Specimen Description BLOOD RIGHT HAND  Final  Special Requests   Final    BOTTLES DRAWN AEROBIC AND ANAEROBIC Blood Culture adequate volume   Culture   Final    NO GROWTH 4 DAYS Performed at [facility], [place] [address], [place], [place] [address]    Report Status PENDING  Incomplete  Surgical pcr screen     Status: None   Collection Time: 10/02/22 12:00 PM   Specimen: Nasal Mucosa; Nasal Swab  Result Value Ref Range Status   MRSA, PCR NEGATIVE NEGATIVE Final   Staphylococcus aureus NEGATIVE NEGATIVE Final    Comment: (NOTE) The Xpert SA Assay (FDA approved for NASAL specimens in patients 63 years of age and older), is one component of a comprehensive surveillance program. It is not intended to diagnose infection nor to guide or monitor  treatment. Performed at [facility], [place] [address], [place], [place] [address]     Radiology Studies:  [place] EKG SITE RITE  Result Date: 10/01/2022 If Site Rite image not attached, placement could not be confirmed due to current cardiac rhythm.   Scheduled Meds:    [MAR Hold] acetaminophen  1,000 mg Oral Q8H   chlorhexidine       [MAR Hold] gabapentin  900 mg Oral QHS   [MAR Hold] pantoprazole  40 mg Oral Daily   [MAR Hold] sodium chloride flush  3 mL Intravenous Q12H   [MAR Hold] venlafaxine XR  225 mg Oral QHS    Continuous Infusions:    acetaminophen     lactated ringers 1,000 mL with potassium chloride 40 mEq infusion 100 mL/hr at 10/02/22 0840   lactated ringers     [MAR Hold] nafcillin 12 g in sodium chloride 0.9 % 500 mL continuous infusion 12 g (10/01/22 1330)   [MAR Hold] promethazine (PHENERGAN) injection (IM or IVPB)       LOS: 6 days     Vernell Leep, MD,  FACP, [place], [place], [facility], [place]     To contact the attending provider between 7A-7P or the covering provider during after hours 7P-7A, please log into the web site [email or web address] and access using universal [place] password for that web site. If you do not have the password, please call the hospital operator.  10/02/2022, 1:52 PM

## 2022-10-02 NOTE — Progress Notes (Addendum)
   10/02/22 1739  Vitals  Temp 98.2 F (36.8 C)  Temp Source Oral  BP (!) 141/81  MAP (mmHg) 97  BP Location Left Arm  BP Method Automatic  Patient Position (if appropriate) Lying  ECG Heart Rate 91  Resp 12  MEWS COLOR  MEWS Score Color Green  Oxygen Therapy  SpO2 97 %  O2 Device Room Air  MEWS Score  MEWS Temp 0  MEWS Systolic 0  MEWS Pulse 0  MEWS RR 1  MEWS LOC 0  MEWS Score 1   Patient arrived from PACU, moaning in pain, family at bedside, patient with low back dressing with red staining on lower part of dressing, not consistent with report. Per PACU RN dressing was clean and dry.  Vital signs obtained and call bell with in reach. Bed side RN Updated. Kariyah Baugh, Bettina Gavia RN

## 2022-10-02 NOTE — Anesthesia Preprocedure Evaluation (Addendum)
Anesthesia Evaluation  Patient identified by MRN, date of birth, ID band Patient awake    Reviewed: Allergy & Precautions, NPO status , Patient's Chart, lab work & pertinent test results  Airway Mallampati: II  TM Distance: >3 FB Neck ROM: Full    Dental no notable dental hx.    Pulmonary neg pulmonary ROS   Pulmonary exam normal        Cardiovascular negative cardio ROS  Rhythm:Regular Rate:Normal     Neuro/Psych  Headaches   Depression       GI/Hepatic Neg liver ROS,GERD  Medicated,,  Endo/Other  negative endocrine ROS    Renal/GU   negative genitourinary   Musculoskeletal CSF leak   Abdominal Normal abdominal exam  (+)   Peds  Hematology negative hematology ROS (+)   Anesthesia Other Findings   Reproductive/Obstetrics                             Anesthesia Physical Anesthesia Plan  ASA: 2  Anesthesia Plan: General   Post-op Pain Management:    Induction: Intravenous  PONV Risk Score and Plan: 3 and Ondansetron, Dexamethasone, Midazolam, Treatment may vary due to age or medical condition and Amisulpride  Airway Management Planned: Mask and Oral ETT  Additional Equipment: None  Intra-op Plan:   Post-operative Plan: Extubation in OR  Informed Consent: I have reviewed the patients History and Physical, chart, labs and discussed the procedure including the risks, benefits and alternatives for the proposed anesthesia with the patient or authorized representative who has indicated his/her understanding and acceptance.     Dental advisory given  Plan Discussed with: CRNA  Anesthesia Plan Comments: (Lab Results      Component                Value               Date                      WBC                      6.0                 09/30/2022                HGB                      10.8 (L)            09/30/2022                HCT                      33.6 (L)             09/30/2022                MCV                      87.5                09/30/2022                PLT                      240  09/30/2022             Lab Results      Component                Value               Date                      NA                       132 (L)             10/02/2022                K                        3.6                 10/02/2022                CO2                      22                  10/02/2022                GLUCOSE                  91                  10/02/2022                BUN                      <5 (L)              10/02/2022                CREATININE               0.72                10/02/2022                CALCIUM                  8.4 (L)             10/02/2022                GFRNONAA                 >60                 10/02/2022           )       Anesthesia Quick Evaluation

## 2022-10-02 NOTE — Op Note (Signed)
10/02/2022  5:26 PM  PATIENT:  Amanda [name]  62 y.o. female Was leaking clear fluid after a laminectomy on the right side at L4/5. She is taken to the operating room for dural repair PRE-OPERATIVE DIAGNOSIS:  Cerebrospinal Fluid Leak  POST-OPERATIVE DIAGNOSIS:  Cerebrospinal Fluid Leak, Lumbar Four-Five Right L4 inferior Facet Fracture  PROCEDURE:  Procedure(s):dural defect repair POSTERIOR LUMBAR INTERBODY FUSION Lumbar Four - Five, Stryker 34m x187mcage packed with autograft morsels Non segmental pedicle screw fixation right L4/5, Stryker Everest SURGEON:  Surgeon(s): CaAshok PallMD  ASSISTANTS:none  ANESTHESIA:   general  EBL:  Total I/O In: 500 [I.V.:500] Out: 100 [Blood:100]  BLOOD ADMINISTERED:none  CELL SAVER GIVEN:not used  COUNT:correct per nursing  DRAINS: none   SPECIMEN:  No Specimen  DICTATION: Amanda [name] is a [age].o. female whom was taken to the operating room intubated, and placed under a general anesthetic without difficulty. A foley catheter was placed under sterile conditions. She was positioned prone on a Wilson frame with all pressure points properly padded.  Her lumbar region was prepped and draped in a sterile manner.  I opened the skin with a 10 blade and took the incision down to the thoracolumbar fascia. I exposed the lamina of L4, and L5 on the right side in a subperiosteal fashion. I confirmed my location with an intraoperative xray.  I placed self retaining retractors and started the decompression.  I decompressed the spinal canal at L4/5 by taking the discetomy further laterally. I used epstein curettes to remove the remaining disc and denude the L4 and L5 surfaces of soft tissue. This was done on the right side.  I identified the CSF leak and undertook a primary repair of the dura.  A PLIF was performed at L4/5 on the right side. . I used curettes, rongeurs, punches, shavers for the disc space, and rasps in the discetomy. I measured  the disc space and placed one 2264m 87m74mtanium cage(Stryker) into the disc space(s). I packed auto graft bone morsels in the disc space in addition to the cages.  I placed pedicle screws at L4, and 5, using fluoroscopic guidance. I drilled a pilot hole, then cannulated the pedicle with a drill at each site. I then tapped each pedicle, assessing each site for pedicle violations. No cutouts were appreciated. Screws 6.5mm 12mmm 32m4, and 6.5mm x37m (S80mer everest) were then placed at each site without difficulty. I attached rods and locking caps with the appropriate tools. The locking caps were secured with torque limited screwdrivers. Final films were performed and the final construct appeared to be in good position.  There was no purulence, evidence of infection, or abnormal findings other than the incompetent facet and csf leak.  I closed the wound in a layered fashion. I approximated the thoracolumbar fascia, subcutaneous, and subcuticular planes with vicryl sutures. I used a nylon suture to approximate the skin edges. I used and occlusive bandage for a sterile dressing.     PLAN OF CARE: Admit to inpatient   PATIENT DISPOSITION:  PACU - hemodynamically stable.   Delay start of Pharmacological VTE agent (>24hrs) due to surgical blood loss or risk of bleeding:  yes

## 2022-10-02 NOTE — Anesthesia Procedure Notes (Signed)
Procedure Name: Intubation Date/Time: 10/02/2022 1:50 PM  Performed by: Darral Dash, DOPre-anesthesia Checklist: Patient identified, Emergency Drugs available, Suction available and Patient being monitored Patient Re-evaluated:Patient Re-evaluated prior to induction Oxygen Delivery Method: Circle System Utilized Preoxygenation: Pre-oxygenation with 100% oxygen Induction Type: IV induction Ventilation: Mask ventilation without difficulty and Two handed mask ventilation required Laryngoscope Size: Glidescope and 3 Grade View: Grade II Tube type: Oral Tube size: 7.0 mm Number of attempts: 4 Airway Equipment and Method: Oral airway, Bougie stylet, Video-laryngoscopy and Rigid stylet Placement Confirmation: ETT inserted through vocal cords under direct vision, positive ETCO2 and breath sounds checked- equal and bilateral Secured at: 20 cm Tube secured with: Tape Dental Injury: Teeth and Oropharynx as per pre-operative assessment and Injury to lip  Difficulty Due To: Difficulty was unanticipated Future Recommendations: Recommend- induction with short-acting agent, and alternative techniques readily available Comments: DL X 2 MAC 3 CRNA grade IV view unsuccessful, bougie X1 unsuccessful. Single attempt by self Sabra Heck #2 grade II view but unable to pass ETT 2/2 anterior larynx. Bougie also unsuccessful. Single attempt GS #3 grade I view successful.

## 2022-10-02 NOTE — Anesthesia Postprocedure Evaluation (Signed)
Anesthesia Post Note  Patient: Yamile E Jurek  Procedure(s) Performed: WOUND EXPLORATION (Back) POSTERIOR LUMBAR INTERBODY FUSION Lumbar Four - Five (Back)     Patient location during evaluation: PACU Anesthesia Type: General Level of consciousness: awake and alert Pain management: pain level controlled Vital Signs Assessment: post-procedure vital signs reviewed and stable Respiratory status: spontaneous breathing, nonlabored ventilation, respiratory function stable and patient connected to nasal cannula oxygen Cardiovascular status: blood pressure returned to baseline and stable Postop Assessment: no apparent nausea or vomiting Anesthetic complications: yes   Encounter Notable Events  Notable Event Outcome Phase Comment  Difficult to intubate - unexpected  Intraprocedure Filed from anesthesia note documentation.    Last Vitals:  Vitals:   10/02/22 1729 10/02/22 1739  BP: (!) 144/86 (!) 141/81  Pulse: 91   Resp: 12 12  Temp: 36.4 C 36.8 C  SpO2: 96% 97%    Last Pain:  Vitals:   10/02/22 1804  TempSrc:   PainSc: 10-Worst pain ever                 Belenda Cruise P Desmon Hitchner

## 2022-10-02 NOTE — Progress Notes (Signed)
BP (!) 155/81   Pulse 92   Temp 98.5 F (36.9 C) (Oral)   Resp 18   Ht 5' 7.99" (1.727 m)   Wt 80.7 kg   SpO2 97%   BMI 27.07 kg/m  Alert and oriented x 4, speech is clear Moving all extremities Wound leaking clear fluid OR for wound exploration

## 2022-10-02 NOTE — Transfer of Care (Signed)
Immediate Anesthesia Transfer of Care Note  Patient: Amanda [name]  Procedure(s) Performed: WOUND EXPLORATION (Back) POSTERIOR LUMBAR INTERBODY FUSION Lumbar Four - Five (Back)  Patient Location: PACU  Anesthesia Type:General  Level of Consciousness: awake, alert , and oriented  Airway & Oxygen Therapy: Patient Spontanous Breathing  Post-op Assessment: Report given to RN and Post -op Vital signs reviewed and stable  Post vital signs: Reviewed and stable  Last Vitals:  Vitals Value Taken Time  BP 133/95 10/02/22 1652  Temp 36.7 C 10/02/22 1650  Pulse 96 10/02/22 1659  Resp 13 10/02/22 1659  SpO2 94 % 10/02/22 1659  Vitals shown include unvalidated device data.  Last Pain:  Vitals:   10/02/22 1155  TempSrc: Oral  PainSc:       Patients Stated Pain Goal: 2 ([id number])  Complications:  Encounter Notable Events  Notable Event Outcome Phase Comment  Difficult to intubate - unexpected  Intraprocedure Filed from anesthesia note documentation.

## 2022-10-03 DIAGNOSIS — R7881 Bacteremia: Secondary | ICD-10-CM | POA: Diagnosis not present

## 2022-10-03 DIAGNOSIS — B9561 Methicillin susceptible Staphylococcus aureus infection as the cause of diseases classified elsewhere: Secondary | ICD-10-CM | POA: Diagnosis not present

## 2022-10-03 DIAGNOSIS — G003 Staphylococcal meningitis: Secondary | ICD-10-CM | POA: Diagnosis not present

## 2022-10-03 LAB — CBC
HCT: 33.4 % — ABNORMAL LOW (ref 36.0–46.0)
Hemoglobin: 11 g/dL — ABNORMAL LOW (ref 12.0–15.0)
MCH: 28.4 pg (ref 26.0–34.0)
MCHC: 32.9 g/dL (ref 30.0–36.0)
MCV: 86.3 fL (ref 80.0–100.0)
Platelets: 342 10*3/uL (ref 150–400)
RBC: 3.87 MIL/uL (ref 3.87–5.11)
RDW: 14.7 % (ref 11.5–15.5)
WBC: 12.6 10*3/uL — ABNORMAL HIGH (ref 4.0–10.5)
nRBC: 0 % (ref 0.0–0.2)

## 2022-10-03 LAB — CULTURE, BLOOD (ROUTINE X 2)
Culture: NO GROWTH
Culture: NO GROWTH
Special Requests: ADEQUATE
Special Requests: ADEQUATE

## 2022-10-03 LAB — COMPREHENSIVE METABOLIC PANEL
ALT: 26 U/L (ref 0–44)
AST: 29 U/L (ref 15–41)
Albumin: 2.4 g/dL — ABNORMAL LOW (ref 3.5–5.0)
Alkaline Phosphatase: 147 U/L — ABNORMAL HIGH (ref 38–126)
Anion gap: 13 (ref 5–15)
BUN: 6 mg/dL — ABNORMAL LOW (ref 8–23)
CO2: 20 mmol/L — ABNORMAL LOW (ref 22–32)
Calcium: 8.4 mg/dL — ABNORMAL LOW (ref 8.9–10.3)
Chloride: 104 mmol/L (ref 98–111)
Creatinine, Ser: 0.87 mg/dL (ref 0.44–1.00)
GFR, Estimated: >60 mL/min (ref >60)
Glucose, Bld: 84 mg/dL (ref 70–99)
Potassium: 4.1 mmol/L (ref 3.5–5.1)
Sodium: 137 mmol/L (ref 135–145)
Total Bilirubin: 1.7 mg/dL — ABNORMAL HIGH (ref 0.3–1.2)
Total Protein: 6.1 g/dL — ABNORMAL LOW (ref 6.5–8.1)

## 2022-10-03 LAB — MAGNESIUM: Magnesium: 2 mg/dL (ref 1.7–2.4)

## 2022-10-03 MED ORDER — KETOROLAC TROMETHAMINE 30 MG/ML IJ SOLN
30.0000 mg | Freq: Four times a day (QID) | INTRAMUSCULAR | Status: DC | PRN
  Administered 2022-10-03 (×4): 30 mg via INTRAVENOUS
  Filled 2022-10-03 (×4): qty 1

## 2022-10-03 MED ORDER — SODIUM CHLORIDE 0.9% FLUSH: 10.0000 mL | INTRAVENOUS | Status: DC | PRN

## 2022-10-03 MED ORDER — SODIUM CHLORIDE 0.9% FLUSH
10.0000 mL | Freq: Two times a day (BID) | INTRAVENOUS | Status: DC
  Administered 2022-10-03: 10 mL
  Administered 2022-10-03: 20 mL
  Administered 2022-10-04 – 2022-10-09 (×9): 10 mL

## 2022-10-03 MED ORDER — CHLORHEXIDINE GLUCONATE CLOTH 2 % EX PADS
6.0000 | MEDICATED_PAD | Freq: Every day | CUTANEOUS | Status: DC
  Administered 2022-10-03 – 2022-10-09 (×7): 6 via TOPICAL

## 2022-10-03 MED ORDER — KETOROLAC TROMETHAMINE 30 MG/ML IJ SOLN: 30.0000 mg | Freq: Four times a day (QID) | INTRAMUSCULAR | Status: DC

## 2022-10-03 NOTE — Progress Notes (Signed)
Peripherally Inserted Central Catheter Placement  The IV Nurse has discussed with the patient and/or persons authorized to consent for the patient, the purpose of this procedure and the potential benefits and risks involved with this procedure.  The benefits include less needle sticks, lab draws from the catheter, and the patient may be discharged home with the catheter. Risks include, but not limited to, infection, bleeding, blood clot (thrombus formation), and puncture of an artery; nerve damage and irregular heartbeat and possibility to perform a PICC exchange if needed/ordered by physician.  Alternatives to this procedure were also discussed.  Bard Power PICC patient education guide, fact sheet on infection prevention and patient information card has been provided to patient /or left at bedside.    PICC Placement Documentation  PICC Double Lumen 123456 Right Basilic 37 cm 0 cm (Active)  Indication for Insertion or Continuance of Line Home intravenous therapies (PICC only) 10/03/22 1027  Exposed Catheter (cm) 0 cm 10/03/22 1027  Site Assessment Clean, Dry, Intact 10/03/22 1027  Lumen #1 Status Flushed;Saline locked;Blood return noted 10/03/22 1027  Lumen #2 Status Flushed;Saline locked;Blood return noted 10/03/22 1027  Dressing Type Transparent;Securing device 10/03/22 1027  Dressing Status Antimicrobial disc in place;Clean, Dry, Intact 10/03/22 1027  Safety Lock Not Applicable [id number]  Line Care Connections checked and tightened 10/03/22 1027  Line Adjustment (NICU/IV Team Only) No 10/03/22 1027  Dressing Intervention New dressing 10/03/22 1027  Dressing Change Due 10/10/22 10/03/22 1027       Rolena Infante 10/03/2022, 10:28 AM

## 2022-10-03 NOTE — Progress Notes (Signed)
PROGRESS NOTE   Amanda [name]  [id number]    DOB: 1960/09/11    DOA: 09/26/2022  PCP: Carol Ada, MD   I have briefly reviewed patients previous medical records in [facility].  Chief Complaint  Patient presents with   Altered Mental Status   Claudication    Brief Narrative:  62 year old married female, s/p right L4-5 laminotomy and microdiscectomy at the outpatient surgical center on 09/14/2022, unremarkable postop course, developed acute onset of confusion, cramping in her right leg, restlessness and combativeness and brought to the ED on 09/26/2022 where she was noted to have a fever of 105 F.  She was admitted for severe sepsis, acute encephalopathy and acute kidney injury.  Since then she is diagnosed with MSSA bacteremia, suspected bacterial meningitis.  ID and neurosurgery consulted and following.  Ongoing severe headaches, improving.  TEE negative for vegetations.  3/9: CSF leak repair and L4-5 PLIF.  Improving.   Assessment & Plan:  Principal Problem:   MSSA bacteremia Active Problems:   Restless legs syndrome (RLS)   Other migraine without status migrainosus, not intractable   Low back pain   Severe sepsis (HCC)   Acute kidney injury [facility])   Surgical site infection   Sepsis with encephalopathy without septic shock (HCC)   Meningitis, staphylococcal   Other spinal cerebrospinal fluid leak   Severe sepsis, secondary to MSSA bacteremia in the setting of recent L4-5 microdiscectomy (09/14/2022) with suspected bacterial meningitis, all POA: Surveillance blood cultures from 3/5: Negative.  Sepsis physiology resolved.  ID following.  MSSA bacteremia: ID follow-up appreciated.  Surgical wound infection as a potential source for bacteremia.  Blood cultures from 09/26/2022 positive for MSSA.  Surveillance blood cultures from 09/28/2022: Negative.  MRI L-spine 3/5 showed subdural fluid collection from L5 through at least T12.  Continue IV nafcillin.  TTE showed normal EF,  no evidence of vegetations.  TEE negative for vegetations. As per neurosurgery follow-up, wound was draining again.  Taken to the OR on 3/9 and underwent CSF leak repair and L4-5 PLIF.  Remains on bedrest x 48 hours postprocedure.  .  Suspected MSSA bacterial meningitis: Difficult and traumatic lumbar puncture.  CSF growing MSSA. Continue IV nafcillin.  ID plan to place PICC line.  Insert of a single-lumen PICC, the PICC team placed a double-lumen PICC line 3/8.  ID will follow-up again tomorrow.  Septic encephalopathy: Secondary to above.  Resolved.  Severe headache: Multifactorial: Acute bacterial meningitis and CSF leak.  MRI brain with and without contrast 3/6: Mild diffuse smooth dural thickening, which is nonspecific but can be seen in the setting of bacterial meningitis.  No additional acute intracranial process.  Reports headache being 25% better than yesterday.  On multimodality pain control.  IV Toradol added by neurosurgery which is helping.  Leg cramps: Unclear etiology.  Electrolytes have been replaced.  Does not feel that this is similar to her RLS.?  Related to meningitis.  IV Toradol helping.  Acute diarrhea: Patient had not had BM for almost a week and then in the last 24 hours, multiple episodes ( 3-4) of loose stools.  Type 6-7 stools per RN.  Patient does have history of IBS.  Discussed with ID MD on-call 3/10: Continue to monitor for now.  If worsens then consider C. difficile and GI panel testing, but not at this time.  No antimotility agents unless infectious etiology ruled out.  Acute kidney injury Creatinine had peaked to 1.4.  Resolved.  Normocytic anemia: Reportedly  chronic.  Follow CBC periodically.  Hypokalemia: Resolved.  Magnesium normal.  Depression, migraine, RLS: Continue home dose of gabapentin (reportedly takes 900 mg at bedtime).  And Effexor XR.  Constipation: Now having acute diarrhea.  Please see above.  Isolated indirect  hyperbilirubinemia: Unclear etiology.  Total bilirubin 2.3.  Improved and stable  Body mass index is 27.07 kg/m.    DVT prophylaxis: SCD's Start: 10/02/22 1752 SCDs Start: 09/26/22 1259     Code Status: Full Code:  ACP Documents: None present Family Communication: Female friend at bedside Disposition:  Pending further clinical improvement.  Remains on IV antibiotics.     Consultants:   Infectious disease Neurosurgery Cardiology   Procedures:   Lumbar puncture TEE 3/8:  LV: normal function and size. LVEF 60-65% RV: normal function and size.  No valvular pathology or intracardiac vegetations identified.   3/9: CSF leak repair and PLIF of L4-5  Antimicrobials:   As noted above   Subjective:  Patient reports that she remained n.p.o. overnight due to returning late after surgery.  Had a rough night with leg cramps which finally improved after IV Toradol initiated by neurosurgery.  When I saw her this morning, she had 1 episode of loose stools overnight but since then RN has reported several others.  Headache 25% better.  Objective:   Vitals:   10/03/22 0751 10/03/22 0932 10/03/22 1129 10/03/22 1303  BP: 138/69  137/77   Pulse: 95 95 91 94  Resp: (!) '21 20 18 14  '$ Temp: 98.6 F (37 C)  98.2 F (36.8 C)   TempSrc: Oral  Oral   SpO2: 98% 100% 98% 99%  Weight:      Height:        General exam: Middle-age female, moderately built and nourished lying in bed.  Appears to be in better spirits today. Respiratory system: Clear to auscultation.  No increased work of breathing. Cardiovascular system: S1 and S2 heard, RRR.  No JVD, murmurs or pedal edema.  Telemetry personally reviewed: Sinus rhythm, discontinued telemetry. Gastrointestinal system: Abdomen is nondistended, soft and nontender. No organomegaly or masses felt. Normal bowel sounds heard. Central nervous system: Alert and oriented. No focal neurological deficits. Extremities: Symmetric 5 x 5 power. Skin: No  rashes, lesions or ulcers.   Psychiatry: Judgement and insight appear somewhat impaired. Mood & affect pleasant and appropriate.    Data Reviewed:   I have personally reviewed following labs and imaging studies   CBC: Recent Labs  Lab 09/27/22 0115 09/30/22 0139 10/03/22 0130  WBC 9.8 6.0 12.6*  HGB 9.9* 10.8* 11.0*  HCT 29.8* 33.6* 33.4*  MCV 87.9 87.5 86.3  PLT 241 240 [id number]    Basic Metabolic Panel: Recent Labs  Lab 09/30/22 0139 09/30/22 1405 10/01/22 0129 10/01/22 0145 10/02/22 0134 10/03/22 0130  NA 134* 136 133*  --  132* 137  K 2.9* 3.8 3.0*  --  3.6 4.1  CL 102 104 100  --  99 104  CO2 16* 19* 21*  --  22 20*  GLUCOSE 73 100* 105*  --  91 84  BUN 8 6* 5*  --  <5* 6*  CREATININE 1.05* 0.97 0.83  --  0.72 0.87  CALCIUM 8.2* 8.3* 8.4*  --  8.4* 8.4*  MG 1.8  --   --  1.7  --  2.0    Liver Function Tests: Recent Labs  Lab 09/27/22 0115 09/30/22 0139 10/01/22 0129 10/02/22 0134 10/03/22 0130  AST 54* 34 41 36  29  ALT 30 28 33 29 26  ALKPHOS 95 178* 182* 156* 147*  BILITOT 0.5 2.3* 1.7* 1.6* 1.7*  PROT 5.6* 5.6* 5.9* 6.0* 6.1*  ALBUMIN 2.5* 2.3* 2.4* 2.5* 2.4*    CBG: No results for input(s): "GLUCAP" in the last 168 hours.  Microbiology Studies:   Recent Results (from the past 240 hour(s))  Blood Culture (routine x 2)     Status: Abnormal   Collection Time: 09/26/22  7:00 AM   Specimen: BLOOD  Result Value Ref Range Status   Specimen Description BLOOD RIGHT ANTECUBITAL  Final   Special Requests   Final    BOTTLES DRAWN AEROBIC AND ANAEROBIC Blood Culture adequate volume   Culture  Setup Time   Final    GRAM POSITIVE COCCI IN CLUSTERS IN BOTH AEROBIC AND ANAEROBIC BOTTLES Organism ID to follow CRITICAL RESULT CALLED TO, READ BACK BY AND VERIFIED WITH: T RUDISILL,PHARMD'@2320'$  09/26/22 [place] Performed at [facility], [address], [place], [place] [address]    Culture STAPHYLOCOCCUS AUREUS (A)  Final   Report Status 09/29/2022 FINAL   Final   Organism ID, Bacteria STAPHYLOCOCCUS AUREUS  Final      Susceptibility   Staphylococcus aureus - MIC*    CIPROFLOXACIN <=0.5 SENSITIVE Sensitive     ERYTHROMYCIN <=0.25 SENSITIVE Sensitive     GENTAMICIN <=0.5 SENSITIVE Sensitive     OXACILLIN 0.5 SENSITIVE Sensitive     TETRACYCLINE <=1 SENSITIVE Sensitive     VANCOMYCIN 1 SENSITIVE Sensitive     TRIMETH/SULFA <=10 SENSITIVE Sensitive     CLINDAMYCIN <=0.25 SENSITIVE Sensitive     RIFAMPIN <=0.5 SENSITIVE Sensitive     Inducible Clindamycin NEGATIVE Sensitive     * STAPHYLOCOCCUS AUREUS  Blood Culture ID Panel (Reflexed)     Status: Abnormal   Collection Time: 09/26/22  7:00 AM  Result Value Ref Range Status   Enterococcus faecalis NOT DETECTED NOT DETECTED Final   Enterococcus Faecium NOT DETECTED NOT DETECTED Final   Listeria monocytogenes NOT DETECTED NOT DETECTED Final   Staphylococcus species DETECTED (A) NOT DETECTED Final    Comment: CRITICAL RESULT CALLED TO, READ BACK BY AND VERIFIED WITH: T RUDISILL,PHARMD'@2320'$  09/26/22 [place]    Staphylococcus aureus (BCID) DETECTED (A) NOT DETECTED Final    Comment: CRITICAL RESULT CALLED TO, READ BACK BY AND VERIFIED WITH: T RUDISILL,PHARMD'@2320'$  09/26/22 [place]    Staphylococcus epidermidis NOT DETECTED NOT DETECTED Final   Staphylococcus lugdunensis NOT DETECTED NOT DETECTED Final   Streptococcus species NOT DETECTED NOT DETECTED Final   Streptococcus agalactiae NOT DETECTED NOT DETECTED Final   Streptococcus pneumoniae NOT DETECTED NOT DETECTED Final   Streptococcus pyogenes NOT DETECTED NOT DETECTED Final   A.calcoaceticus-baumannii NOT DETECTED NOT DETECTED Final   Bacteroides fragilis NOT DETECTED NOT DETECTED Final   Enterobacterales NOT DETECTED NOT DETECTED Final   Enterobacter cloacae complex NOT DETECTED NOT DETECTED Final   Escherichia coli NOT DETECTED NOT DETECTED Final   Klebsiella aerogenes NOT DETECTED NOT DETECTED Final   Klebsiella oxytoca NOT DETECTED NOT  DETECTED Final   Klebsiella pneumoniae NOT DETECTED NOT DETECTED Final   Proteus species NOT DETECTED NOT DETECTED Final   Salmonella species NOT DETECTED NOT DETECTED Final   Serratia marcescens NOT DETECTED NOT DETECTED Final   Haemophilus influenzae NOT DETECTED NOT DETECTED Final   Neisseria meningitidis NOT DETECTED NOT DETECTED Final   Pseudomonas aeruginosa NOT DETECTED NOT DETECTED Final   Stenotrophomonas maltophilia NOT DETECTED NOT DETECTED Final  Candida albicans NOT DETECTED NOT DETECTED Final   Candida auris NOT DETECTED NOT DETECTED Final   Candida glabrata NOT DETECTED NOT DETECTED Final   Candida krusei NOT DETECTED NOT DETECTED Final   Candida parapsilosis NOT DETECTED NOT DETECTED Final   Candida tropicalis NOT DETECTED NOT DETECTED Final   Cryptococcus neoformans/gattii NOT DETECTED NOT DETECTED Final   Meth resistant mecA/C and MREJ NOT DETECTED NOT DETECTED Final    Comment: Performed at [facility], [place] [address] [place], [place] [address]  Blood Culture (routine x 2)     Status: Abnormal   Collection Time: 09/26/22  7:02 AM   Specimen: BLOOD  Result Value Ref Range Status   Specimen Description BLOOD LEFT ANTECUBITAL  Final   Special Requests   Final    BOTTLES DRAWN AEROBIC AND ANAEROBIC Blood Culture adequate volume   Culture  Setup Time   Final    GRAM POSITIVE COCCI IN BOTH AEROBIC AND ANAEROBIC BOTTLES CRITICAL VALUE NOTED.  VALUE IS CONSISTENT WITH PREVIOUSLY REPORTED AND CALLED VALUE.    Culture (A)  Final    STAPHYLOCOCCUS AUREUS SUSCEPTIBILITIES PERFORMED ON PREVIOUS CULTURE WITHIN THE LAST 5 DAYS. Performed at [facility], [place] [address] [place], [place] [address]    Report Status 09/29/2022 FINAL  Final  Resp panel by RT-PCR (RSV, Flu A&B, Covid) Anterior Nasal Swab     Status: None   Collection Time: 09/26/22 10:14 AM   Specimen: Anterior Nasal Swab  Result Value Ref Range Status   SARS Coronavirus 2 by RT PCR NEGATIVE  NEGATIVE Final   Influenza A by PCR NEGATIVE NEGATIVE Final   Influenza B by PCR NEGATIVE NEGATIVE Final    Comment: (NOTE) The Xpert Xpress SARS-CoV-2/FLU/RSV plus assay is intended as an aid in the diagnosis of influenza from Nasopharyngeal swab specimens and should not be used as a sole basis for treatment. Nasal washings and aspirates are unacceptable for Xpert Xpress SARS-CoV-2/FLU/RSV testing.  Fact Sheet for Patients: EntrepreneurPulse.com.au  Fact Sheet for Healthcare Providers: IncredibleEmployment.be  This test is not yet approved or cleared by the [place] FDA and has been authorized for detection and/or diagnosis of SARS-CoV-2 by FDA under an Emergency Use Authorization (EUA). This EUA will remain in effect (meaning this test can be used) for the duration of the COVID-19 declaration under Section 564(b)(1) of the Act, 21 U.S.C. section 360bbb-3(b)(1), unless the authorization is terminated or revoked.     Resp Syncytial Virus by PCR NEGATIVE NEGATIVE Final    Comment: (NOTE) Fact Sheet for Patients: EntrepreneurPulse.com.au  Fact Sheet for Healthcare Providers: IncredibleEmployment.be  This test is not yet approved or cleared by the [place] FDA and has been authorized for detection and/or diagnosis of SARS-CoV-2 by FDA under an Emergency Use Authorization (EUA). This EUA will remain in effect (meaning this test can be used) for the duration of the COVID-19 declaration under Section 564(b)(1) of the Act, 21 U.S.C. section 360bbb-3(b)(1), unless the authorization is terminated or revoked.  Performed at [facility], [place] [address] [place],  [address]   CSF culture w Gram Stain     Status: None   Collection Time: 09/27/22  4:00 PM   Specimen: PATH Cytology CSF; Cerebrospinal Fluid  Result Value Ref Range Status   Specimen Description CSF  Final   Special Requests  NONE  Final   Gram Stain   Final    RARE WBC PRESENT, PREDOMINANTLY PMN RARE GRAM POSITIVE  COCCI CRITICAL RESULT CALLED TO, READ BACK BY AND VERIFIED WITH: RN ERIN ROWETTE ON 09/27/22 @ 1757 BY DRT Performed at [facility], [place] [address], [place], [place] [address]    Culture FEW STAPHYLOCOCCUS AUREUS  Final   Report Status 09/30/2022 FINAL  Final   Organism ID, Bacteria STAPHYLOCOCCUS AUREUS  Final      Susceptibility   Staphylococcus aureus - MIC*    CIPROFLOXACIN <=0.5 SENSITIVE Sensitive     ERYTHROMYCIN <=0.25 SENSITIVE Sensitive     GENTAMICIN <=0.5 SENSITIVE Sensitive     OXACILLIN 0.5 SENSITIVE Sensitive     TETRACYCLINE <=1 SENSITIVE Sensitive     VANCOMYCIN 1 SENSITIVE Sensitive     TRIMETH/SULFA <=10 SENSITIVE Sensitive     CLINDAMYCIN <=0.25 SENSITIVE Sensitive     RIFAMPIN <=0.5 SENSITIVE Sensitive     Inducible Clindamycin NEGATIVE Sensitive     * FEW STAPHYLOCOCCUS AUREUS  Anaerobic culture w Gram Stain     Status: None   Collection Time: 09/27/22  4:00 PM   Specimen: PATH Cytology CSF; Cerebrospinal Fluid  Result Value Ref Range Status   Specimen Description CSF  Final   Special Requests NONE  Final   Gram Stain   Final    RARE WBC PRESENT, PREDOMINANTLY PMN RARE GRAM POSITIVE COCCI    Culture   Final    NO ANAEROBES ISOLATED Performed at [facility], [place] [address], [place], [place] [address]    Report Status 10/02/2022 FINAL  Final  Culture, blood (Routine X 2) w Reflex to ID Panel     Status: None   Collection Time: 09/28/22  8:10 AM   Specimen: BLOOD  Result Value Ref Range Status   Specimen Description BLOOD RIGHT ANTECUBITAL  Final   Special Requests   Final    BOTTLES DRAWN AEROBIC AND ANAEROBIC Blood Culture adequate volume   Culture   Final    NO GROWTH 5 DAYS Performed at [facility], [place] [address], [place], [place] [address]    Report Status 10/03/2022 FINAL  Final  Culture, blood (Routine X 2) w Reflex to ID Panel      Status: None   Collection Time: 09/28/22  8:21 AM   Specimen: BLOOD RIGHT HAND  Result Value Ref Range Status   Specimen Description BLOOD RIGHT HAND  Final   Special Requests   Final    BOTTLES DRAWN AEROBIC AND ANAEROBIC Blood Culture adequate volume   Culture   Final    NO GROWTH 5 DAYS Performed at [facility], [place] [address] [place], [place] [address]    Report Status 10/03/2022 FINAL  Final  Surgical pcr screen     Status: None   Collection Time: 10/02/22 12:00 PM   Specimen: Nasal Mucosa; Nasal Swab  Result Value Ref Range Status   MRSA, PCR NEGATIVE NEGATIVE Final   Staphylococcus aureus NEGATIVE NEGATIVE Final    Comment: (NOTE) The Xpert SA Assay (FDA approved for NASAL specimens in patients 41 years of age and older), is one component of a comprehensive surveillance program. It is not intended to diagnose infection nor to guide or monitor treatment. Performed at [facility], [place] [address], [place], [place] [address]     Radiology Studies:  DG Lumbar Spine 2-3 Views  Result Date: 10/02/2022 CLINICAL DATA:  Lumbar spine surgery EXAM: LUMBAR SPINE - 2-3 VIEW COMPARISON:  MRI lumbar spine 09/28/2022 FINDINGS: Postsurgical changes compatible with L4-5 fusion  and interbody spacer. L5-S1 degenerative changes. IMPRESSION: Postsurgical changes compatible with L4-5 fusion. Electronically Signed   By: Lovey Newcomer M.D.   On: 10/02/2022 16:49   DG C-Arm 1-60 Min-No Report  Result Date: 10/02/2022 Fluoroscopy was utilized by the requesting physician.  No radiographic interpretation.   DG C-Arm 1-60 Min-No Report  Result Date: 10/02/2022 Fluoroscopy was utilized by the requesting physician.  No radiographic interpretation.    Scheduled Meds:    acetaminophen  1,000 mg Oral Q8H   Chlorhexidine Gluconate Cloth  6 each Topical Daily   gabapentin  900 mg Oral QHS   oxyCODONE  20 mg Oral Q12H   pantoprazole  40 mg Oral Daily   sodium chloride flush  10-40 mL  Intracatheter Q12H   sodium chloride flush  3 mL Intravenous Q12H   venlafaxine XR  225 mg Oral QHS    Continuous Infusions:    sodium chloride     lactated ringers 1,000 mL with potassium chloride 40 mEq infusion Stopped (10/03/22 0815)   nafcillin 12 g in sodium chloride 0.9 % 500 mL continuous infusion 12 g (10/03/22 1234)   promethazine (PHENERGAN) injection (IM or IVPB) Stopped (10/02/22 2331)     LOS: 7 days     Vernell Leep, MD,  FACP, [facility], [facility], [facility], [place]     To contact the attending provider between 7A-7P or the covering provider during after hours 7P-7A, please log into the web site [email or web address] and access using universal [place] password for that web site. If you do not have the password, please call the hospital operator.  10/03/2022, 2:45 PM

## 2022-10-03 NOTE — Progress Notes (Signed)
Neurosurgery Service Progress Note  Subjective: No acute events overnight. Overnight she complained of severe pain despite taking IV dilaudid, Oxy 20 ER, oxy 10 IR, and valium. Toradol was added and she is feeling significantly better. States that her LE spasms, radiculopathy, and headaches are all improved.    Objective: Vitals:   10/02/22 2100 10/02/22 2332 10/03/22 0342 10/03/22 0751  BP: (!) 140/80 (!) 148/73 134/76 138/69  Pulse: 93 93 97 95  Resp: '12 13 15 '$ (!) 21  Temp:  98.2 F (36.8 C) 98.8 F (37.1 C) 98.6 F (37 C)  TempSrc:  Oral Axillary Oral  SpO2: 99% 99% 99% 98%  Weight:      Height:        Physical Exam: Strength 5/5 x4 and SILTx4, incision c/d/I   Assessment & Plan: 62 y.o. female s/p CSF leak repair and L4-5 PLIF, recovering well.  -continue strict bed rest with HOB flat x48 hours post-op -continue current pain regimen and Toradol '30mg'$  IV Q6h prn  -SCDs / TED   Norm Parcel, PA-C 10/03/22 8:39 AM

## 2022-10-04 ENCOUNTER — Encounter (HOSPITAL_COMMUNITY): Payer: Self-pay | Admitting: Neurosurgery

## 2022-10-04 DIAGNOSIS — A4101 Sepsis due to Methicillin susceptible Staphylococcus aureus: Secondary | ICD-10-CM | POA: Diagnosis not present

## 2022-10-04 DIAGNOSIS — G003 Staphylococcal meningitis: Secondary | ICD-10-CM | POA: Diagnosis not present

## 2022-10-04 DIAGNOSIS — G9609 Other spinal cerebrospinal fluid leak: Secondary | ICD-10-CM

## 2022-10-04 DIAGNOSIS — B9561 Methicillin susceptible Staphylococcus aureus infection as the cause of diseases classified elsewhere: Secondary | ICD-10-CM | POA: Diagnosis not present

## 2022-10-04 DIAGNOSIS — T8149XA Infection following a procedure, other surgical site, initial encounter: Secondary | ICD-10-CM | POA: Diagnosis not present

## 2022-10-04 DIAGNOSIS — R7881 Bacteremia: Secondary | ICD-10-CM | POA: Diagnosis not present

## 2022-10-04 LAB — CBC
HCT: 29 % — ABNORMAL LOW (ref 36.0–46.0)
Hemoglobin: 9.7 g/dL — ABNORMAL LOW (ref 12.0–15.0)
MCH: 28.7 pg (ref 26.0–34.0)
MCHC: 33.4 g/dL (ref 30.0–36.0)
MCV: 85.8 fL (ref 80.0–100.0)
Platelets: 368 10*3/uL (ref 150–400)
RBC: 3.38 MIL/uL — ABNORMAL LOW (ref 3.87–5.11)
RDW: 15 % (ref 11.5–15.5)
WBC: 9.6 10*3/uL (ref 4.0–10.5)
nRBC: 0 % (ref 0.0–0.2)

## 2022-10-04 LAB — COMPREHENSIVE METABOLIC PANEL
ALT: 16 U/L (ref 0–44)
AST: 17 U/L (ref 15–41)
Albumin: 1.7 g/dL — ABNORMAL LOW (ref 3.5–5.0)
Alkaline Phosphatase: 108 U/L (ref 38–126)
Anion gap: 12 (ref 5–15)
BUN: <5 mg/dL — ABNORMAL LOW (ref 8–23)
CO2: 18 mmol/L — ABNORMAL LOW (ref 22–32)
Calcium: 6.9 mg/dL — ABNORMAL LOW (ref 8.9–10.3)
Chloride: 109 mmol/L (ref 98–111)
Creatinine, Ser: 0.6 mg/dL (ref 0.44–1.00)
GFR, Estimated: >60 mL/min (ref >60)
Glucose, Bld: 80 mg/dL (ref 70–99)
Potassium: 3 mmol/L — ABNORMAL LOW (ref 3.5–5.1)
Sodium: 139 mmol/L (ref 135–145)
Total Bilirubin: 1.2 mg/dL (ref 0.3–1.2)
Total Protein: 4.6 g/dL — ABNORMAL LOW (ref 6.5–8.1)

## 2022-10-04 MED ORDER — KETOROLAC TROMETHAMINE 30 MG/ML IJ SOLN: 30.0000 mg | Freq: Four times a day (QID) | INTRAMUSCULAR | Status: DC | PRN

## 2022-10-04 MED ORDER — POTASSIUM CHLORIDE 2 MEQ/ML IV SOLN
INTRAVENOUS | Status: DC
  Filled 2022-10-04 (×4): qty 1000

## 2022-10-04 MED ORDER — POTASSIUM CHLORIDE CRYS ER 20 MEQ PO TBCR
40.0000 meq | EXTENDED_RELEASE_TABLET | ORAL | Status: AC
  Administered 2022-10-04 (×2): 40 meq via ORAL
  Filled 2022-10-04 (×2): qty 2

## 2022-10-04 MED ORDER — KETOROLAC TROMETHAMINE 30 MG/ML IJ SOLN
30.0000 mg | Freq: Four times a day (QID) | INTRAMUSCULAR | Status: AC | PRN
  Administered 2022-10-04: 30 mg via INTRAVENOUS
  Filled 2022-10-04: qty 1

## 2022-10-04 MED ORDER — KETOROLAC TROMETHAMINE 30 MG/ML IJ SOLN
30.0000 mg | Freq: Four times a day (QID) | INTRAMUSCULAR | Status: DC | PRN
  Administered 2022-10-04 – 2022-10-05 (×4): 30 mg via INTRAVENOUS
  Filled 2022-10-04 (×4): qty 1

## 2022-10-04 NOTE — Anesthesia Postprocedure Evaluation (Signed)
Anesthesia Post Note  Patient: Amanda [name]  Procedure(s) Performed: TRANSESOPHAGEAL ECHOCARDIOGRAM (TEE)     Patient location during evaluation: Endoscopy Anesthesia Type: MAC Level of consciousness: awake Pain management: pain level controlled Vital Signs Assessment: post-procedure vital signs reviewed and stable Respiratory status: spontaneous breathing, nonlabored ventilation and respiratory function stable Cardiovascular status: blood pressure returned to baseline and stable Postop Assessment: no apparent nausea or vomiting Anesthetic complications: no   No notable events documented.  Last Vitals:  Vitals:   10/04/22 0330 10/04/22 0800  BP: (!) 146/89   Pulse: 100 91  Resp: 17 15  Temp: 36.7 C 36.7 C  SpO2: 98% 100%    Last Pain:  Vitals:   10/04/22 0800  TempSrc: Oral  PainSc: 10-Worst pain ever                 Sahasra Belue P Arriyah Madej

## 2022-10-04 NOTE — Progress Notes (Signed)
Patient ID: Amanda [name], female   DOB: 04/07/1961, 62 y.o.   MRN: [id number] BP (!) 149/75 (BP Location: Left Arm)   Pulse 95   Temp 98 F (36.7 C) (Oral)   Resp 15   Ht 5' 7.99" (1.727 m)   Wt 80.7 kg   SpO2 100%   BMI 27.07 kg/m  Alert and oriented x 4 Speech is clear and fluent Moving lower extremities well Wound is clean, dry, flat May have head of bed up tonight

## 2022-10-04 NOTE — Progress Notes (Signed)
Subjective: No new complaints   Antibiotics:  Anti-infectives (From admission, onward)    Start     Dose/Rate Route Frequency Ordered Stop   09/27/22 1315  nafcillin 12 g in sodium chloride 0.9 % 500 mL continuous infusion        12 g 20.8 mL/hr over 24 Hours Intravenous Every 24 hours 09/27/22 1226     09/27/22 0300  ceFAZolin (ANCEF) IVPB 2g/100 mL premix  Status:  Discontinued        2 g 200 mL/hr over 30 Minutes Intravenous Every 8 hours 09/26/22 2341 09/27/22 1226   09/26/22 2000  ceFEPIme (MAXIPIME) 2 g in sodium chloride 0.9 % 100 mL IVPB  Status:  Discontinued        2 g 200 mL/hr over 30 Minutes Intravenous Every 12 hours 09/26/22 1326 09/26/22 2341   09/26/22 1900  metroNIDAZOLE (FLAGYL) IVPB 500 mg  Status:  Discontinued        500 mg 100 mL/hr over 60 Minutes Intravenous Every 12 hours 09/26/22 1252 09/26/22 2341   09/26/22 1326  vancomycin variable dose per unstable renal function (pharmacist dosing)  Status:  Discontinued         Does not apply See admin instructions 09/26/22 1326 09/26/22 2341   09/26/22 1030  ampicillin (OMNIPEN) 2 g in sodium chloride 0.9 % 100 mL IVPB        2 g 300 mL/hr over 20 Minutes Intravenous Once 09/26/22 1017 09/26/22 1155   09/26/22 0745  vancomycin (VANCOREADY) IVPB 1500 mg/300 mL        1,500 mg 150 mL/hr over 120 Minutes Intravenous  Once 09/26/22 0737 09/26/22 1128   09/26/22 0730  ceFEPIme (MAXIPIME) 2 g in sodium chloride 0.9 % 100 mL IVPB        2 g 200 mL/hr over 30 Minutes Intravenous  Once 09/26/22 0719 09/26/22 0815   09/26/22 0730  metroNIDAZOLE (FLAGYL) IVPB 500 mg        500 mg 100 mL/hr over 60 Minutes Intravenous  Once 09/26/22 0719 09/26/22 0919   09/26/22 0730  vancomycin (VANCOCIN) IVPB 1000 mg/200 mL premix  Status:  Discontinued        1,000 mg 200 mL/hr over 60 Minutes Intravenous  Once 09/26/22 0719 09/26/22 0737       Medications: Scheduled Meds:  acetaminophen  1,000 mg Oral Q8H    Chlorhexidine Gluconate Cloth  6 each Topical Daily   gabapentin  900 mg Oral QHS   oxyCODONE  20 mg Oral Q12H   pantoprazole  40 mg Oral Daily   sodium chloride flush  10-40 mL Intracatheter Q12H   sodium chloride flush  3 mL Intravenous Q12H   venlafaxine XR  225 mg Oral QHS   Continuous Infusions:  sodium chloride     lactated ringers 1,000 mL with potassium chloride 40 mEq infusion     nafcillin 12 g in sodium chloride 0.9 % 500 mL continuous infusion 20.8 mL/hr at 10/04/22 0400   promethazine (PHENERGAN) injection (IM or IVPB) Stopped (10/02/22 2331)   PRN Meds:.diazepam, HYDROmorphone (DILAUDID) injection, ketorolac, menthol-cetylpyridinium **OR** phenol, ondansetron (ZOFRAN) IV, mouth rinse, oxyCODONE, oxyCODONE, polyethylene glycol, promethazine (PHENERGAN) injection (IM or IVPB), sodium chloride flush, sodium chloride flush, traZODone    Objective: Weight change:   Intake/Output Summary (Last 24 hours) at 10/04/2022 1106 Last data filed at 10/04/2022 0400 Gross per 24 hour  Intake 1971.31 ml  Output 1200 ml  Net 771.31  ml   Blood pressure (!) 146/82, pulse 91, temperature 98 F (36.7 C), temperature source Oral, resp. rate 15, height 5' 7.99" (1.727 m), weight 80.7 kg, SpO2 100 %. Temp:  [97.7 F (36.5 C)-98.2 F (36.8 C)] 98 F (36.7 C) (03/11 0800) Pulse Rate:  [90-100] 91 (03/11 0800) Resp:  [13-18] 15 (03/11 0800) BP: (132-148)/(66-89) 146/82 (03/11 0800) SpO2:  [98 %-100 %] 100 % (03/11 0800)  Physical Exam: Physical Exam Constitutional:      General: She is not in acute distress.    Appearance: She is well-developed. She is not diaphoretic.  HENT:     Head: Normocephalic and atraumatic.     Right Ear: External ear normal.     Left Ear: External ear normal.     Mouth/Throat:     Pharynx: No oropharyngeal exudate.  Eyes:     General: No scleral icterus.    Conjunctiva/sclera: Conjunctivae normal.     Pupils: Pupils are equal, round, and reactive to  light.  Cardiovascular:     Rate and Rhythm: Normal rate and regular rhythm.  Pulmonary:     Effort: Pulmonary effort is normal. No respiratory distress.     Breath sounds: No wheezing.  Abdominal:     General: Bowel sounds are normal.     Palpations: Abdomen is soft.  Musculoskeletal:        General: No tenderness. Normal range of motion.  Lymphadenopathy:     Cervical: No cervical adenopathy.  Skin:    General: Skin is warm and dry.     Coloration: Skin is not pale.     Findings: No erythema or rash.  Neurological:     Mental Status: She is alert and oriented to person, place, and time.     Motor: No abnormal muscle tone.     Coordination: Coordination normal.  Psychiatric:        Mood and Affect: Mood normal.        Behavior: Behavior normal.        Thought Content: Thought content normal.        Judgment: Judgment normal.      CBC:    BMET Recent Labs    10/03/22 0130 10/04/22 0355  NA 137 139  K 4.1 3.0*  CL 104 109  CO2 20* 18*  GLUCOSE 84 80  BUN 6* <5*  CREATININE 0.87 0.60  CALCIUM 8.4* 6.9*     Liver Panel  Recent Labs    10/03/22 0130 10/04/22 0355  PROT 6.1* 4.6*  ALBUMIN 2.4* 1.7*  AST 29 17  ALT 26 16  ALKPHOS 147* 108  BILITOT 1.7* 1.2       Sedimentation Rate No results for input(s): "ESRSEDRATE" in the last 72 hours. C-Reactive Protein No results for input(s): "CRP" in the last 72 hours.  Micro Results: Recent Results (from the past 720 hour(s))  Blood Culture (routine x 2)     Status: Abnormal   Collection Time: 09/26/22  7:00 AM   Specimen: BLOOD  Result Value Ref Range Status   Specimen Description BLOOD RIGHT ANTECUBITAL  Final   Special Requests   Final    BOTTLES DRAWN AEROBIC AND ANAEROBIC Blood Culture adequate volume   Culture  Setup Time   Final    GRAM POSITIVE COCCI IN CLUSTERS IN BOTH AEROBIC AND ANAEROBIC BOTTLES Organism ID to follow CRITICAL RESULT CALLED TO, READ BACK BY AND VERIFIED WITH: T  RUDISILL,PHARMD'@2320'$  09/26/22 [place] Performed at [facility],  [address], [place], [place] [address]    Culture STAPHYLOCOCCUS AUREUS (A)  Final   Report Status 09/29/2022 FINAL  Final   Organism ID, Bacteria STAPHYLOCOCCUS AUREUS  Final      Susceptibility   Staphylococcus aureus - MIC*    CIPROFLOXACIN <=0.5 SENSITIVE Sensitive     ERYTHROMYCIN <=0.25 SENSITIVE Sensitive     GENTAMICIN <=0.5 SENSITIVE Sensitive     OXACILLIN 0.5 SENSITIVE Sensitive     TETRACYCLINE <=1 SENSITIVE Sensitive     VANCOMYCIN 1 SENSITIVE Sensitive     TRIMETH/SULFA <=10 SENSITIVE Sensitive     CLINDAMYCIN <=0.25 SENSITIVE Sensitive     RIFAMPIN <=0.5 SENSITIVE Sensitive     Inducible Clindamycin NEGATIVE Sensitive     * STAPHYLOCOCCUS AUREUS  Blood Culture ID Panel (Reflexed)     Status: Abnormal   Collection Time: 09/26/22  7:00 AM  Result Value Ref Range Status   Enterococcus faecalis NOT DETECTED NOT DETECTED Final   Enterococcus Faecium NOT DETECTED NOT DETECTED Final   Listeria monocytogenes NOT DETECTED NOT DETECTED Final   Staphylococcus species DETECTED (A) NOT DETECTED Final    Comment: CRITICAL RESULT CALLED TO, READ BACK BY AND VERIFIED WITH: T RUDISILL,PHARMD'@2320'$  09/26/22 [place]    Staphylococcus aureus (BCID) DETECTED (A) NOT DETECTED Final    Comment: CRITICAL RESULT CALLED TO, READ BACK BY AND VERIFIED WITH: T RUDISILL,PHARMD'@2320'$  09/26/22 [place]    Staphylococcus epidermidis NOT DETECTED NOT DETECTED Final   Staphylococcus lugdunensis NOT DETECTED NOT DETECTED Final   Streptococcus species NOT DETECTED NOT DETECTED Final   Streptococcus agalactiae NOT DETECTED NOT DETECTED Final   Streptococcus pneumoniae NOT DETECTED NOT DETECTED Final   Streptococcus pyogenes NOT DETECTED NOT DETECTED Final   A.calcoaceticus-baumannii NOT DETECTED NOT DETECTED Final   Bacteroides fragilis NOT DETECTED NOT DETECTED Final   Enterobacterales NOT DETECTED NOT DETECTED Final   Enterobacter cloacae  complex NOT DETECTED NOT DETECTED Final   Escherichia coli NOT DETECTED NOT DETECTED Final   Klebsiella aerogenes NOT DETECTED NOT DETECTED Final   Klebsiella oxytoca NOT DETECTED NOT DETECTED Final   Klebsiella pneumoniae NOT DETECTED NOT DETECTED Final   Proteus species NOT DETECTED NOT DETECTED Final   Salmonella species NOT DETECTED NOT DETECTED Final   Serratia marcescens NOT DETECTED NOT DETECTED Final   Haemophilus influenzae NOT DETECTED NOT DETECTED Final   Neisseria meningitidis NOT DETECTED NOT DETECTED Final   Pseudomonas aeruginosa NOT DETECTED NOT DETECTED Final   Stenotrophomonas maltophilia NOT DETECTED NOT DETECTED Final   Candida albicans NOT DETECTED NOT DETECTED Final   Candida auris NOT DETECTED NOT DETECTED Final   Candida glabrata NOT DETECTED NOT DETECTED Final   Candida krusei NOT DETECTED NOT DETECTED Final   Candida parapsilosis NOT DETECTED NOT DETECTED Final   Candida tropicalis NOT DETECTED NOT DETECTED Final   Cryptococcus neoformans/gattii NOT DETECTED NOT DETECTED Final   Meth resistant mecA/C and MREJ NOT DETECTED NOT DETECTED Final    Comment: Performed at [facility], [address], [place], [place] [address]  Blood Culture (routine x 2)     Status: Abnormal   Collection Time: 09/26/22  7:02 AM   Specimen: BLOOD  Result Value Ref Range Status   Specimen Description BLOOD LEFT ANTECUBITAL  Final   Special Requests   Final    BOTTLES DRAWN AEROBIC AND ANAEROBIC Blood Culture adequate volume   Culture  Setup Time   Final    GRAM POSITIVE COCCI IN BOTH AEROBIC AND ANAEROBIC  BOTTLES CRITICAL VALUE NOTED.  VALUE IS CONSISTENT WITH PREVIOUSLY REPORTED AND CALLED VALUE.    Culture (A)  Final    STAPHYLOCOCCUS AUREUS SUSCEPTIBILITIES PERFORMED ON PREVIOUS CULTURE WITHIN THE LAST 5 DAYS. Performed at [facility], [place] [address] [place], [place] [address]    Report Status 09/29/2022 FINAL  Final  Resp panel by RT-PCR (RSV, Flu A&B,  Covid) Anterior Nasal Swab     Status: None   Collection Time: 09/26/22 10:14 AM   Specimen: Anterior Nasal Swab  Result Value Ref Range Status   SARS Coronavirus 2 by RT PCR NEGATIVE NEGATIVE Final   Influenza A by PCR NEGATIVE NEGATIVE Final   Influenza B by PCR NEGATIVE NEGATIVE Final    Comment: (NOTE) The Xpert Xpress SARS-CoV-2/FLU/RSV plus assay is intended as an aid in the diagnosis of influenza from Nasopharyngeal swab specimens and should not be used as a sole basis for treatment. Nasal washings and aspirates are unacceptable for Xpert Xpress SARS-CoV-2/FLU/RSV testing.  Fact Sheet for Patients: EntrepreneurPulse.com.au  Fact Sheet for Healthcare Providers: IncredibleEmployment.be  This test is not yet approved or cleared by the [place] FDA and has been authorized for detection and/or diagnosis of SARS-CoV-2 by FDA under an Emergency Use Authorization (EUA). This EUA will remain in effect (meaning this test can be used) for the duration of the COVID-19 declaration under Section 564(b)(1) of the Act, 21 U.S.C. section 360bbb-3(b)(1), unless the authorization is terminated or revoked.     Resp Syncytial Virus by PCR NEGATIVE NEGATIVE Final    Comment: (NOTE) Fact Sheet for Patients: EntrepreneurPulse.com.au  Fact Sheet for Healthcare Providers: IncredibleEmployment.be  This test is not yet approved or cleared by the [place] FDA and has been authorized for detection and/or diagnosis of SARS-CoV-2 by FDA under an Emergency Use Authorization (EUA). This EUA will remain in effect (meaning this test can be used) for the duration of the COVID-19 declaration under Section 564(b)(1) of the Act, 21 U.S.C. section 360bbb-3(b)(1), unless the authorization is terminated or revoked.  Performed at [facility], [place] [address] [place], [place] [address]   CSF culture w Gram Stain      Status: None   Collection Time: 09/27/22  4:00 PM   Specimen: PATH Cytology CSF; Cerebrospinal Fluid  Result Value Ref Range Status   Specimen Description CSF  Final   Special Requests NONE  Final   Gram Stain   Final    RARE WBC PRESENT, PREDOMINANTLY PMN RARE GRAM POSITIVE COCCI CRITICAL RESULT CALLED TO, READ BACK BY AND VERIFIED WITH: RN ERIN ROWETTE ON 09/27/22 @ 1757 BY DRT Performed at [facility], [place] [address] [place], [place] [address]    Culture FEW STAPHYLOCOCCUS AUREUS  Final   Report Status 09/30/2022 FINAL  Final   Organism ID, Bacteria STAPHYLOCOCCUS AUREUS  Final      Susceptibility   Staphylococcus aureus - MIC*    CIPROFLOXACIN <=0.5 SENSITIVE Sensitive     ERYTHROMYCIN <=0.25 SENSITIVE Sensitive     GENTAMICIN <=0.5 SENSITIVE Sensitive     OXACILLIN 0.5 SENSITIVE Sensitive     TETRACYCLINE <=1 SENSITIVE Sensitive     VANCOMYCIN 1 SENSITIVE Sensitive     TRIMETH/SULFA <=10 SENSITIVE Sensitive     CLINDAMYCIN <=0.25 SENSITIVE Sensitive     RIFAMPIN <=0.5 SENSITIVE Sensitive     Inducible Clindamycin NEGATIVE Sensitive     * FEW STAPHYLOCOCCUS AUREUS  Anaerobic culture w Gram Stain  Status: None   Collection Time: 09/27/22  4:00 PM   Specimen: PATH Cytology CSF; Cerebrospinal Fluid  Result Value Ref Range Status   Specimen Description CSF  Final   Special Requests NONE  Final   Gram Stain   Final    RARE WBC PRESENT, PREDOMINANTLY PMN RARE GRAM POSITIVE COCCI    Culture   Final    NO ANAEROBES ISOLATED Performed at [facility], [place] [address] [place], [place] [address]    Report Status 10/02/2022 FINAL  Final  Culture, blood (Routine X 2) w Reflex to ID Panel     Status: None   Collection Time: 09/28/22  8:10 AM   Specimen: BLOOD  Result Value Ref Range Status   Specimen Description BLOOD RIGHT ANTECUBITAL  Final   Special Requests   Final    BOTTLES DRAWN AEROBIC AND ANAEROBIC Blood Culture adequate volume   Culture   Final    NO  GROWTH 5 DAYS Performed at [facility], [place] [address], [place], [place] [address]    Report Status 10/03/2022 FINAL  Final  Culture, blood (Routine X 2) w Reflex to ID Panel     Status: None   Collection Time: 09/28/22  8:21 AM   Specimen: BLOOD RIGHT HAND  Result Value Ref Range Status   Specimen Description BLOOD RIGHT HAND  Final   Special Requests   Final    BOTTLES DRAWN AEROBIC AND ANAEROBIC Blood Culture adequate volume   Culture   Final    NO GROWTH 5 DAYS Performed at [facility], [place] [address], [place], [place] [address]    Report Status 10/03/2022 FINAL  Final  Surgical pcr screen     Status: None   Collection Time: 10/02/22 12:00 PM   Specimen: Nasal Mucosa; Nasal Swab  Result Value Ref Range Status   MRSA, PCR NEGATIVE NEGATIVE Final   Staphylococcus aureus NEGATIVE NEGATIVE Final    Comment: (NOTE) The Xpert SA Assay (FDA approved for NASAL specimens in patients 108 years of age and older), is one component of a comprehensive surveillance program. It is not intended to diagnose infection nor to guide or monitor treatment. Performed at [facility], [place] [address] [place], [place] [address]     Studies/Results: DG Lumbar Spine 2-3 Views  Result Date: 10/02/2022 CLINICAL DATA:  Lumbar spine surgery EXAM: LUMBAR SPINE - 2-3 VIEW COMPARISON:  MRI lumbar spine 09/28/2022 FINDINGS: Postsurgical changes compatible with L4-5 fusion and interbody spacer. L5-S1 degenerative changes. IMPRESSION: Postsurgical changes compatible with L4-5 fusion. Electronically Signed   By: Lovey Newcomer M.D.   On: 10/02/2022 16:49   DG C-Arm 1-60 Min-No Report  Result Date: 10/02/2022 Fluoroscopy was utilized by the requesting physician.  No radiographic interpretation.   DG C-Arm 1-60 Min-No Report  Result Date: 10/02/2022 Fluoroscopy was utilized by the requesting physician.  No radiographic interpretation.      Assessment/Plan:  INTERVAL HISTORY: pt is sp CSF  repair lumbar fusion with hardware   Principal Problem:   MSSA bacteremia Active Problems:   Restless legs syndrome (RLS)   Other migraine without status migrainosus, not intractable   Low back pain   Severe sepsis (HCC)   Acute kidney injury ([place])   Surgical site infection   Sepsis with encephalopathy without septic shock ([place])   Meningitis, staphylococcal   Other spinal cerebrospinal fluid leak    Amanda [name] is a 62 y.o. female with who underwent  microdiscectomy that was complicated by infection with MSSA bacteremia and MSSA also having seeded CSF. She has been on nafcillin and has now undergone repair of CSF leak and placement of hardware  TEE clean  #1 MSSA bacteremia with CSF infection, CSF leak after microdiscectomy.  There was a subdural fluid collection seen on MRI spine thought to be infection vs hematoma with latter favored ultimately by radiology  She has had CSF leak identified by Dr. Christella Noa and repaird with posterior interbody fusion.  While the area where hardware was placed does not sound to have had any evidence of overt infection I would still have anxiety about MSSA having had potential to involve hardware  We will plan on giving her 2 weeks of nafcillin postoperatively followed by an additional 4 weeks of cefazolin for 6 weeks postoperative antibiotics total.  I will see him in the clinic and discuss options of extending antibiotics with orals afterwards given the presence of hardware.  I spent 54 minutes with the patient including than 50% of the time in face to face counseling of the patient writing her MSSA bacteremia recent lumbar surgery CSF leak with MSSA infection of spinal fluid meninges,  along with review of medical records in preparation for the visit and during the visit and in coordination of her care.   Diagnosis: MSSA bacteremia, meningitis, CSF leak  Culture Result: MSSA  Allergies  Allergen Reactions   Wellbutrin [Bupropion]      GERD   Aspirin Other (See Comments)    Makes her ears ring    OPAT Orders Discharge antibiotics to be given via PICC line Discharge antibiotics: Regimen: Nafcillin continuous infusion of 12g daily through 3/22, followed by cefazolin 2g IV q8h from 3/23 through 4/19 End date: 11/12/2022   [facility] Care Per Protocol:  Home health RN for IV administration and teaching; PICC line care and labs.    Labs weekly while on IV antibiotics: _x_ CBC with differential x__ BMP  _x_ CRP _x_ ESR   _x_ Please pull PIC at completion of IV antibiotics __ Please leave PIC in place until doctor has seen patient or been notified  Fax weekly labs to [phone]  Clinic Follow Up Appt:   Tryna Crow [place] has an appointment on 10/27/2022 at 245PM with Dr. Tommy Medal at  [facility], which  is located in the [facility] at  [address] in [place].  [address], which is located to the left of the elevators.  Phone: [phone]  Fax: [phone]  [email or web address]  The patient should arrive 30 minutes prior to their appoitment.  I have shown the patient and her husband our website and where our clinic is located  I will sign off for now.  Please call with further questions.   LOS: 8 days   Alcide Evener 10/04/2022, 11:06 AM

## 2022-10-04 NOTE — Progress Notes (Signed)
PROGRESS NOTE   Amanda [name]  [id number]    DOB: 1961-04-14    DOA: 09/26/2022  PCP: Carol Ada, MD   I have briefly reviewed patients previous medical records in [facility]  Chief Complaint  Patient presents with   Altered Mental Status   Claudication    Brief Narrative:  62 year old married female, s/p right L4-5 laminotomy and microdiscectomy at the outpatient surgical center on 09/14/2022, unremarkable postop course, developed acute onset of confusion, cramping in her right leg, restlessness and combativeness and brought to the ED on 09/26/2022 where she was noted to have a fever of 105 F.  She was admitted for severe sepsis, acute encephalopathy and acute kidney injury.  Since then she is diagnosed with MSSA bacteremia, suspected bacterial meningitis.  ID and neurosurgery consulted and following.  Ongoing severe headaches, improving.  TEE negative for vegetations.  3/9: CSF leak repair and L4-5 PLIF.  Improving.   Assessment & Plan:  Principal Problem:   MSSA bacteremia Active Problems:   Restless legs syndrome (RLS)   Other migraine without status migrainosus, not intractable   Low back pain   Severe sepsis (HCC)   Acute kidney injury [facility])   Surgical site infection   Sepsis with encephalopathy without septic shock (HCC)   Meningitis, staphylococcal   Other spinal cerebrospinal fluid leak   Severe sepsis, secondary to MSSA bacteremia in the setting of recent L4-5 microdiscectomy (09/14/2022) with suspected bacterial meningitis, all POA: Surveillance blood cultures from 3/5: Negative.  Sepsis physiology resolved.  ID following.  Discussed with ID in person 3/11.  MSSA bacteremia: ID follow-up appreciated.  Surgical wound infection as a potential source for bacteremia.  Blood cultures from 09/26/2022 positive for MSSA.  Surveillance blood cultures from 09/28/2022: Negative.  MRI L-spine 3/5 showed subdural fluid collection from L5 through at least T12.  Continue  IV nafcillin infusion of 12 g daily through 10/15/2022, followed by cefazolin 2 g IV every 8 hours from 3/23 through 11/12/2022 (as per ID signoff recommendations 3/11).  TTE showed normal EF, no evidence of vegetations.  TEE negative for vegetations. As per neurosurgery follow-up, wound was draining again.  Taken to the OR on 3/9 and underwent CSF leak repair and L4-5 PLIF.  Remains on bedrest x 48 hours postprocedure, supposed to end today.  .  Suspected MSSA bacterial meningitis: Difficult and traumatic lumbar puncture.  CSF growing MSSA.  IV antibiotics management as noted above.  Instead of a single-lumen PICC, the PICC team placed a double-lumen PICC line 3/8.  ID signed off on 3/11 and has follow-up with Dr. Tommy Medal in [place] clinic on 10/27/2022 at 2:45 PM.  Septic encephalopathy: Secondary to above.  Resolved.  Severe headache: Multifactorial: Acute bacterial meningitis and CSF leak.  MRI brain with and without contrast 3/6: Mild diffuse smooth dural thickening, which is nonspecific but can be seen in the setting of bacterial meningitis.  No additional acute intracranial process.  On multimodality pain control.  Headache significant for better.  Patient insisting on continuing oral pain regimen including IV Toradol for additional 24 hours.  Made her aware of AKI risks.  Leg cramps: Unclear etiology.  Electrolytes have been replaced.  Does not feel that this is similar to her RLS.?  Related to meningitis.  IV Toradol helping.  Acute diarrhea, self-limited: Had several episodes of loose stools on 3/10.  Case was reviewed with ID MD on-call, managed expectantly.  No BM since 7 PM last night.  Discontinued  contact isolation and GI panel that were ordered.  Continue to monitor.  Discussed with ID who agree.  Acute kidney injury Creatinine had peaked to 1.4.  Resolved.  Normocytic anemia: Reportedly chronic.  Follow CBC periodically.  Hypokalemia: Recurrent.  Continue to replace aggressively and  follow.  Depression, migraine, RLS: Continue home dose of gabapentin (reportedly takes 900 mg at bedtime).  And Effexor XR.  Constipation: Since then had diarrhea, self-limited.  Monitor  Isolated indirect hyperbilirubinemia: Unclear etiology.  Total bilirubin 2.3.  Resolved.  Body mass index is 27.07 kg/m.    DVT prophylaxis: SCD's Start: 10/02/22 1752 SCDs Start: 09/26/22 1259     Code Status: Full Code:  ACP Documents: None present Family Communication: Spouse at bedside Disposition:  Pending further clinical improvement.  Remains on IV antibiotics.  Yet to mobilize post spine procedure.     Consultants:   Infectious disease Neurosurgery Cardiology   Procedures:   Lumbar puncture TEE 3/8:  LV: normal function and size. LVEF 60-65% RV: normal function and size.  No valvular pathology or intracardiac vegetations identified.   3/9: CSF leak repair and PLIF of L4-5  Antimicrobials:   As noted above   Subjective:  Overall feels better.  No BM since 7 PM on 3/10.  Headache has significantly improved.  Still with poor and inconsistent oral intake.  Headache and cramps controlled with IV Toradol and wishes to continue.  Spouse reiterates the same.  Objective:   Vitals:   10/03/22 1947 10/04/22 0000 10/04/22 0330 10/04/22 0800  BP: (!) 148/83 132/82 (!) 146/89 (!) 146/82  Pulse:  94 100 91  Resp: '13 18 17 15  '$ Temp: 98.1 F (36.7 C) 98 F (36.7 C) 98 F (36.7 C) 98 F (36.7 C)  TempSrc: Oral Oral Oral Oral  SpO2: 100% 99% 98% 100%  Weight:      Height:        General exam: Middle-age female, moderately built and nourished lying comfortably in bed.  Did not appear in any distress. Respiratory system: Clear to auscultation.  No increased work of breathing. Cardiovascular system: S1 and S2 heard, RRR.  No JVD, murmurs or pedal edema.  Telemetry personally reviewed: Sinus rhythm.  Despite discontinue telemetry order being placed, remains on  telemetry. Gastrointestinal system: Abdomen is nondistended, soft and nontender. No organomegaly or masses felt. Normal bowel sounds heard. Central nervous system: Alert and oriented. No focal neurological deficits. Extremities: Symmetric 5 x 5 power. Skin: No rashes, lesions or ulcers.   Psychiatry: Judgement and insight appear somewhat impaired. Mood & affect pleasant and appropriate.    Data Reviewed:   I have personally reviewed following labs and imaging studies   CBC: Recent Labs  Lab 09/30/22 0139 10/03/22 0130 10/04/22 0355  WBC 6.0 12.6* 9.6  HGB 10.8* 11.0* 9.7*  HCT 33.6* 33.4* 29.0*  MCV 87.5 86.3 85.8  PLT 240 342 [id number]    Basic Metabolic Panel: Recent Labs  Lab 09/30/22 0139 09/30/22 1405 10/01/22 0129 10/01/22 0145 10/02/22 0134 10/03/22 0130 10/04/22 0355  NA 134* 136 133*  --  132* 137 139  K 2.9* 3.8 3.0*  --  3.6 4.1 3.0*  CL 102 104 100  --  99 104 109  CO2 16* 19* 21*  --  22 20* 18*  GLUCOSE 73 100* 105*  --  91 84 80  BUN 8 6* 5*  --  <5* 6* <5*  CREATININE 1.05* 0.97 0.83  --  0.72 0.87 0.60  CALCIUM 8.2* 8.3* 8.4*  --  8.4* 8.4* 6.9*  MG 1.8  --   --  1.7  --  2.0  --     Liver Function Tests: Recent Labs  Lab 09/30/22 0139 10/01/22 0129 10/02/22 0134 10/03/22 0130 10/04/22 0355  AST 34 41 36 29 17  ALT 28 33 '29 26 16  '$ ALKPHOS 178* 182* 156* 147* 108  BILITOT 2.3* 1.7* 1.6* 1.7* 1.2  PROT 5.6* 5.9* 6.0* 6.1* 4.6*  ALBUMIN 2.3* 2.4* 2.5* 2.4* 1.7*    CBG: No results for input(s): "GLUCAP" in the last 168 hours.  Microbiology Studies:   Recent Results (from the past 240 hour(s))  Blood Culture (routine x 2)     Status: Abnormal   Collection Time: 09/26/22  7:00 AM   Specimen: BLOOD  Result Value Ref Range Status   Specimen Description BLOOD RIGHT ANTECUBITAL  Final   Special Requests   Final    BOTTLES DRAWN AEROBIC AND ANAEROBIC Blood Culture adequate volume   Culture  Setup Time   Final    GRAM POSITIVE COCCI IN  CLUSTERS IN BOTH AEROBIC AND ANAEROBIC BOTTLES Organism ID to follow CRITICAL RESULT CALLED TO, READ BACK BY AND VERIFIED WITH: T RUDISILL,PHARMD'@2320'$  09/26/22 [place] Performed at [facility], [address], [place], [place] [address]    Culture STAPHYLOCOCCUS AUREUS (A)  Final   Report Status 09/29/2022 FINAL  Final   Organism ID, Bacteria STAPHYLOCOCCUS AUREUS  Final      Susceptibility   Staphylococcus aureus - MIC*    CIPROFLOXACIN <=0.5 SENSITIVE Sensitive     ERYTHROMYCIN <=0.25 SENSITIVE Sensitive     GENTAMICIN <=0.5 SENSITIVE Sensitive     OXACILLIN 0.5 SENSITIVE Sensitive     TETRACYCLINE <=1 SENSITIVE Sensitive     VANCOMYCIN 1 SENSITIVE Sensitive     TRIMETH/SULFA <=10 SENSITIVE Sensitive     CLINDAMYCIN <=0.25 SENSITIVE Sensitive     RIFAMPIN <=0.5 SENSITIVE Sensitive     Inducible Clindamycin NEGATIVE Sensitive     * STAPHYLOCOCCUS AUREUS  Blood Culture ID Panel (Reflexed)     Status: Abnormal   Collection Time: 09/26/22  7:00 AM  Result Value Ref Range Status   Enterococcus faecalis NOT DETECTED NOT DETECTED Final   Enterococcus Faecium NOT DETECTED NOT DETECTED Final   Listeria monocytogenes NOT DETECTED NOT DETECTED Final   Staphylococcus species DETECTED (A) NOT DETECTED Final    Comment: CRITICAL RESULT CALLED TO, READ BACK BY AND VERIFIED WITH: T RUDISILL,PHARMD'@2320'$  09/26/22 [place]    Staphylococcus aureus (BCID) DETECTED (A) NOT DETECTED Final    Comment: CRITICAL RESULT CALLED TO, READ BACK BY AND VERIFIED WITH: T RUDISILL,PHARMD'@2320'$  09/26/22 [place]    Staphylococcus epidermidis NOT DETECTED NOT DETECTED Final   Staphylococcus lugdunensis NOT DETECTED NOT DETECTED Final   Streptococcus species NOT DETECTED NOT DETECTED Final   Streptococcus agalactiae NOT DETECTED NOT DETECTED Final   Streptococcus pneumoniae NOT DETECTED NOT DETECTED Final   Streptococcus pyogenes NOT DETECTED NOT DETECTED Final   A.calcoaceticus-baumannii NOT DETECTED NOT DETECTED Final    Bacteroides fragilis NOT DETECTED NOT DETECTED Final   Enterobacterales NOT DETECTED NOT DETECTED Final   Enterobacter cloacae complex NOT DETECTED NOT DETECTED Final   Escherichia coli NOT DETECTED NOT DETECTED Final   Klebsiella aerogenes NOT DETECTED NOT DETECTED Final   Klebsiella oxytoca NOT DETECTED NOT DETECTED Final   Klebsiella pneumoniae NOT DETECTED NOT DETECTED Final   Proteus species NOT DETECTED NOT DETECTED Final   Salmonella  species NOT DETECTED NOT DETECTED Final   Serratia marcescens NOT DETECTED NOT DETECTED Final   Haemophilus influenzae NOT DETECTED NOT DETECTED Final   Neisseria meningitidis NOT DETECTED NOT DETECTED Final   Pseudomonas aeruginosa NOT DETECTED NOT DETECTED Final   Stenotrophomonas maltophilia NOT DETECTED NOT DETECTED Final   Candida albicans NOT DETECTED NOT DETECTED Final   Candida auris NOT DETECTED NOT DETECTED Final   Candida glabrata NOT DETECTED NOT DETECTED Final   Candida krusei NOT DETECTED NOT DETECTED Final   Candida parapsilosis NOT DETECTED NOT DETECTED Final   Candida tropicalis NOT DETECTED NOT DETECTED Final   Cryptococcus neoformans/gattii NOT DETECTED NOT DETECTED Final   Meth resistant mecA/C and MREJ NOT DETECTED NOT DETECTED Final    Comment: Performed at [facility], [place] [address] [place], [place] [address]  Blood Culture (routine x 2)     Status: Abnormal   Collection Time: 09/26/22  7:02 AM   Specimen: BLOOD  Result Value Ref Range Status   Specimen Description BLOOD LEFT ANTECUBITAL  Final   Special Requests   Final    BOTTLES DRAWN AEROBIC AND ANAEROBIC Blood Culture adequate volume   Culture  Setup Time   Final    GRAM POSITIVE COCCI IN BOTH AEROBIC AND ANAEROBIC BOTTLES CRITICAL VALUE NOTED.  VALUE IS CONSISTENT WITH PREVIOUSLY REPORTED AND CALLED VALUE.    Culture (A)  Final    STAPHYLOCOCCUS AUREUS SUSCEPTIBILITIES PERFORMED ON PREVIOUS CULTURE WITHIN THE LAST 5 DAYS. Performed at [facility], [place] [address], [place], [place] [address]    Report Status 09/29/2022 FINAL  Final  Resp panel by RT-PCR (RSV, Flu A&B, Covid) Anterior Nasal Swab     Status: None   Collection Time: 09/26/22 10:14 AM   Specimen: Anterior Nasal Swab  Result Value Ref Range Status   SARS Coronavirus 2 by RT PCR NEGATIVE NEGATIVE Final   Influenza A by PCR NEGATIVE NEGATIVE Final   Influenza B by PCR NEGATIVE NEGATIVE Final    Comment: (NOTE) The Xpert Xpress SARS-CoV-2/FLU/RSV plus assay is intended as an aid in the diagnosis of influenza from Nasopharyngeal swab specimens and should not be used as a sole basis for treatment. Nasal washings and aspirates are unacceptable for Xpert Xpress SARS-CoV-2/FLU/RSV testing.  Fact Sheet for Patients: EntrepreneurPulse.com.au  Fact Sheet for Healthcare Providers: IncredibleEmployment.be  This test is not yet approved or cleared by the [place] FDA and has been authorized for detection and/or diagnosis of SARS-CoV-2 by FDA under an Emergency Use Authorization (EUA). This EUA will remain in effect (meaning this test can be used) for the duration of the COVID-19 declaration under Section 564(b)(1) of the Act, 21 U.S.C. section 360bbb-3(b)(1), unless the authorization is terminated or revoked.     Resp Syncytial Virus by PCR NEGATIVE NEGATIVE Final    Comment: (NOTE) Fact Sheet for Patients: EntrepreneurPulse.com.au  Fact Sheet for Healthcare Providers: IncredibleEmployment.be  This test is not yet approved or cleared by the [place] FDA and has been authorized for detection and/or diagnosis of SARS-CoV-2 by FDA under an Emergency Use Authorization (EUA). This EUA will remain in effect (meaning this test can be used) for the duration of the COVID-19 declaration under Section 564(b)(1) of the Act, 21 U.S.C. section 360bbb-3(b)(1), unless the authorization is  terminated or revoked.  Performed at [facility], [place] [address], [place], [place] [address]   CSF culture w Gram Stain     Status: None  Collection Time: 09/27/22  4:00 PM   Specimen: PATH Cytology CSF; Cerebrospinal Fluid  Result Value Ref Range Status   Specimen Description CSF  Final   Special Requests NONE  Final   Gram Stain   Final    RARE WBC PRESENT, PREDOMINANTLY PMN RARE GRAM POSITIVE COCCI CRITICAL RESULT CALLED TO, READ BACK BY AND VERIFIED WITH: RN ERIN ROWETTE ON 09/27/22 @ 1757 BY DRT Performed at [facility], [place] [address] [place], [place] [address]    Culture FEW STAPHYLOCOCCUS AUREUS  Final   Report Status 09/30/2022 FINAL  Final   Organism ID, Bacteria STAPHYLOCOCCUS AUREUS  Final      Susceptibility   Staphylococcus aureus - MIC*    CIPROFLOXACIN <=0.5 SENSITIVE Sensitive     ERYTHROMYCIN <=0.25 SENSITIVE Sensitive     GENTAMICIN <=0.5 SENSITIVE Sensitive     OXACILLIN 0.5 SENSITIVE Sensitive     TETRACYCLINE <=1 SENSITIVE Sensitive     VANCOMYCIN 1 SENSITIVE Sensitive     TRIMETH/SULFA <=10 SENSITIVE Sensitive     CLINDAMYCIN <=0.25 SENSITIVE Sensitive     RIFAMPIN <=0.5 SENSITIVE Sensitive     Inducible Clindamycin NEGATIVE Sensitive     * FEW STAPHYLOCOCCUS AUREUS  Anaerobic culture w Gram Stain     Status: None   Collection Time: 09/27/22  4:00 PM   Specimen: PATH Cytology CSF; Cerebrospinal Fluid  Result Value Ref Range Status   Specimen Description CSF  Final   Special Requests NONE  Final   Gram Stain   Final    RARE WBC PRESENT, PREDOMINANTLY PMN RARE GRAM POSITIVE COCCI    Culture   Final    NO ANAEROBES ISOLATED Performed at [facility], [place] [address] [place], [place] [address]    Report Status 10/02/2022 FINAL  Final  Culture, blood (Routine X 2) w Reflex to ID Panel     Status: None   Collection Time: 09/28/22  8:10 AM   Specimen: BLOOD  Result Value Ref Range Status   Specimen Description BLOOD RIGHT  ANTECUBITAL  Final   Special Requests   Final    BOTTLES DRAWN AEROBIC AND ANAEROBIC Blood Culture adequate volume   Culture   Final    NO GROWTH 5 DAYS Performed at [facility], [place] [address] [place], [place] [address]    Report Status 10/03/2022 FINAL  Final  Culture, blood (Routine X 2) w Reflex to ID Panel     Status: None   Collection Time: 09/28/22  8:21 AM   Specimen: BLOOD RIGHT HAND  Result Value Ref Range Status   Specimen Description BLOOD RIGHT HAND  Final   Special Requests   Final    BOTTLES DRAWN AEROBIC AND ANAEROBIC Blood Culture adequate volume   Culture   Final    NO GROWTH 5 DAYS Performed at [facility], [place] [address] [place], [place] [address]    Report Status 10/03/2022 FINAL  Final  Surgical pcr screen     Status: None   Collection Time: 10/02/22 12:00 PM   Specimen: Nasal Mucosa; Nasal Swab  Result Value Ref Range Status   MRSA, PCR NEGATIVE NEGATIVE Final   Staphylococcus aureus NEGATIVE NEGATIVE Final    Comment: (NOTE) The Xpert SA Assay (FDA approved for NASAL specimens in patients 33 years of age and older), is one component of a comprehensive surveillance program. It is not intended to diagnose infection nor to guide or monitor treatment. Performed at  [facility], [place] [address] [place], [place] [address]     Radiology Studies:  DG Lumbar Spine 2-3 Views  Result Date: 10/02/2022 CLINICAL DATA:  Lumbar spine surgery EXAM: LUMBAR SPINE - 2-3 VIEW COMPARISON:  MRI lumbar spine 09/28/2022 FINDINGS: Postsurgical changes compatible with L4-5 fusion and interbody spacer. L5-S1 degenerative changes. IMPRESSION: Postsurgical changes compatible with L4-5 fusion. Electronically Signed   By: Lovey Newcomer M.D.   On: 10/02/2022 16:49   DG C-Arm 1-60 Min-No Report  Result Date: 10/02/2022 Fluoroscopy was utilized by the requesting physician.  No radiographic interpretation.   DG C-Arm 1-60 Min-No Report  Result Date:  10/02/2022 Fluoroscopy was utilized by the requesting physician.  No radiographic interpretation.    Scheduled Meds:    acetaminophen  1,000 mg Oral Q8H   Chlorhexidine Gluconate Cloth  6 each Topical Daily   gabapentin  900 mg Oral QHS   oxyCODONE  20 mg Oral Q12H   pantoprazole  40 mg Oral Daily   sodium chloride flush  10-40 mL Intracatheter Q12H   sodium chloride flush  3 mL Intravenous Q12H   venlafaxine XR  225 mg Oral QHS    Continuous Infusions:    sodium chloride     lactated ringers 1,000 mL with potassium chloride 40 mEq infusion 50 mL/hr at 10/04/22 1237   nafcillin 12 g in sodium chloride 0.9 % 500 mL continuous infusion 12 g (10/04/22 1236)   promethazine (PHENERGAN) injection (IM or IVPB) Stopped (10/02/22 2331)     LOS: 8 days     Vernell Leep, MD,  FACP, [facility], [facility], [facility], [place]     To contact the attending provider between 7A-7P or the covering provider during after hours 7P-7A, please log into the web site [email or web address] and access using universal Enterprise password for that web site. If you do not have the password, please call the hospital operator.  10/04/2022, 1:20 PM

## 2022-10-04 NOTE — TOC Initial Note (Signed)
Transition of Care (TOC) - Initial/Assessment Note  Marvetta Gibbons RN, BSN Transitions of Care Unit 4E- RN Case Manager See Treatment Team for direct phone #   Patient Details  Name: Amanda [name] MRN: [id number] Date of Birth: 1960/11/20  Transition of Care [facility]) CM/SW Contact:    Dawayne Patricia, RN Phone Number: 10/04/2022, 3:44 PM  Clinical Narrative:                 Pt from home w/ spouse, s/p CSF leak repair and L4-5 PLIF on 3/9- pt remains on bedrest through today.  Note per ID pt will need LT IV abx- plan -2 weeks of nafcillin followed by an additional 4 weeks of cefazolin for 6 weeks postoperative antibiotics total. End date 11/12/22.   Cm spoke with pt at bedside- discussed home IV abx needs and HH- list provided for [facility] choice Per CMS guidelines from [email or web address] website with star ratings (copy placed in shadow chart). Per pt she is agreeable to use preferred provider w/ ID- [facility] for home infusion pharmacy needs- and states she does not have a preference for [facility] agency as long as they are high star rated and in-network with her insurance.   Pt voiced that she does not feel like she will need therapy at home- however has not been up yet post up- has a cane at home, will see how she does with mobility before deciding on possible RW for home.   Address, phone # and PCP all confirmed, spouse to assist on return home, and will help with abx at home. Spouse will also transport home.  Call made to [facility] w/ [facility] infusion for home IV abx referral- Pam will follow for transition needs  And education at the bedside prior to discharge. CM will f/u for [facility] coordination pending pt's mobility and other [place] needs. [facility] referral pending)- Pt will need [place] orders placed prior to discharge.     Expected Discharge Plan: [place] Barriers to Discharge: Continued Medical Work up   Patient Goals and CMS Choice Patient states their goals for this  hospitalization and ongoing recovery are:: return home and get better CMS Medicare.gov Compare Post Acute Care list provided to:: Patient Choice offered to / list presented to : Patient      Expected Discharge Plan and Services   Discharge Planning Services: CM Consult Post Acute Care Choice: [place] arrangements for the past 2 months: [place] Arranged: RN, IV Antibiotics HH Agency: [facility] Date [place]: 10/04/22 Time [place]: 1400 Representative spoke with at [place]: [place] (abx)  Prior Living Arrangements/Services Living arrangements for the past 2 months: [place] Lives with:: Spouse Patient language and need for interpreter reviewed:: Yes Do you feel safe going back to the place where you live?: Yes      Need for Family Participation in Patient Care: Yes (Comment) Care giver support system in place?: Yes (comment) Current home services: DME (cane) Criminal Activity/Legal Involvement Pertinent to Current Situation/Hospitalization: No - Comment as needed  Activities of Daily Living Home Assistive Devices/Equipment: Eyeglasses ADL Screening (condition at time of admission) Patient's cognitive ability adequate to safely complete daily activities?: Yes Is the patient deaf or have difficulty hearing?: No Does the patient have difficulty seeing, even when wearing glasses/contacts?: No  Does the patient have difficulty concentrating, remembering, or making decisions?: Yes Patient able to express need for assistance with ADLs?: Yes Does the patient have difficulty dressing or bathing?: No Independently performs ADLs?: Yes (appropriate for developmental age) Does the patient have difficulty walking or climbing stairs?: No Weakness of Legs: Both Weakness of Arms/Hands: None  Permission Sought/Granted Permission sought to share information with : Facility Event organiser granted to share information with : Yes, Verbal Permission Granted     Permission granted to share info w AGENCY: HH/infusion        Emotional Assessment Appearance:: Appears stated age Attitude/Demeanor/Rapport: Engaged Affect (typically observed): Accepting, Appropriate Orientation: : Oriented to Self, Oriented to Place, Oriented to  Time, Oriented to Situation Alcohol / Substance Use: Not Applicable Psych Involvement: No (comment)  Admission diagnosis:  Sepsis ([place]) [A41.9] Sepsis with encephalopathy without septic shock, due to unspecified organism ([place]) [A41.9, R65.20, G93.41] Other spinal cerebrospinal fluid leak [G96.09] Patient Active Problem List   Diagnosis Date Noted   Other spinal cerebrospinal fluid leak 10/02/2022   Meningitis, staphylococcal 09/28/2022   MSSA bacteremia 09/27/2022   Acute kidney injury ([place]) 09/27/2022   Surgical site infection 09/27/2022   Sepsis with encephalopathy without septic shock ([place]) 09/27/2022   Severe sepsis ([place]) 09/26/2022   Low back pain 08/26/2022   Mouth droop due to facial weakness 09/06/2018   Other migraine without status migrainosus, not intractable    Acute encephalopathy 04/19/2016   Restless legs syndrome (RLS) 12/04/2012   PCP:  Carol Ada, MD Pharmacy:   [facility] [id number], [place] [address] Phone: [phone] Fax: [phone]     Social Determinants of Health (SDOH) Social History: SDOH Screenings   Food Insecurity: No Food Insecurity (09/27/2022)  Housing: Low Risk  (09/27/2022)  Transportation Needs: No Transportation Needs (09/27/2022)  Utilities: Not At Risk (09/27/2022)  Tobacco Use: Low Risk  (10/04/2022)   SDOH Interventions:     Readmission Risk Interventions     No data to display

## 2022-10-04 NOTE — Progress Notes (Signed)
PHARMACY CONSULT NOTE FOR:  OUTPATIENT  PARENTERAL ANTIBIOTIC THERAPY (OPAT)  Indication: MSSA bacteremia / meningitis Regimen: Nafcillin continuous infusion of 12g daily through 3/22, followed by cefazolin 2g IV q8h from 3/23 through 4/19 End date: 11/12/2022  IV antibiotic discharge orders are pended. To discharging provider:  please sign these orders via discharge navigator,  Select New Orders & click on the button choice - Manage This Unsigned Work.     Thank you for allowing pharmacy to be a part of this patient's care.  Candie Mile 10/04/2022, 11:15 AM

## 2022-10-05 ENCOUNTER — Encounter (HOSPITAL_COMMUNITY): Payer: Self-pay | Admitting: Cardiology

## 2022-10-05 DIAGNOSIS — B9561 Methicillin susceptible Staphylococcus aureus infection as the cause of diseases classified elsewhere: Secondary | ICD-10-CM | POA: Diagnosis not present

## 2022-10-05 DIAGNOSIS — G003 Staphylococcal meningitis: Secondary | ICD-10-CM | POA: Diagnosis not present

## 2022-10-05 DIAGNOSIS — R197 Diarrhea, unspecified: Secondary | ICD-10-CM

## 2022-10-05 DIAGNOSIS — R7881 Bacteremia: Secondary | ICD-10-CM | POA: Diagnosis not present

## 2022-10-05 LAB — CBC
HCT: 29.5 % — ABNORMAL LOW (ref 36.0–46.0)
Hemoglobin: 9.7 g/dL — ABNORMAL LOW (ref 12.0–15.0)
MCH: 28.8 pg (ref 26.0–34.0)
MCHC: 32.9 g/dL (ref 30.0–36.0)
MCV: 87.5 fL (ref 80.0–100.0)
Platelets: 391 10*3/uL (ref 150–400)
RBC: 3.37 MIL/uL — ABNORMAL LOW (ref 3.87–5.11)
RDW: 15.2 % (ref 11.5–15.5)
WBC: 11 10*3/uL — ABNORMAL HIGH (ref 4.0–10.5)
nRBC: 0 % (ref 0.0–0.2)

## 2022-10-05 LAB — BASIC METABOLIC PANEL
Anion gap: 9 (ref 5–15)
BUN: <5 mg/dL — ABNORMAL LOW (ref 8–23)
CO2: 26 mmol/L (ref 22–32)
Calcium: 8.7 mg/dL — ABNORMAL LOW (ref 8.9–10.3)
Chloride: 99 mmol/L (ref 98–111)
Creatinine, Ser: 0.69 mg/dL (ref 0.44–1.00)
GFR, Estimated: >60 mL/min (ref >60)
Glucose, Bld: 95 mg/dL (ref 70–99)
Potassium: 3.8 mmol/L (ref 3.5–5.1)
Sodium: 134 mmol/L — ABNORMAL LOW (ref 135–145)

## 2022-10-05 LAB — GASTROINTESTINAL PANEL BY PCR, STOOL (REPLACES STOOL CULTURE)

## 2022-10-05 LAB — MAGNESIUM: Magnesium: 1.8 mg/dL (ref 1.7–2.4)

## 2022-10-05 MED ORDER — ELETRIPTAN HYDROBROMIDE 20 MG PO TABS
40.0000 mg | ORAL_TABLET | ORAL | Status: DC | PRN
  Administered 2022-10-05: 40 mg via ORAL
  Filled 2022-10-05 (×2): qty 2

## 2022-10-05 NOTE — Progress Notes (Signed)
PROGRESS NOTE   Amanda [name]  [id number]    DOB: 05-19-1961    DOA: 09/26/2022  PCP: Carol Ada, MD   I have briefly reviewed patients previous medical records in [facility].  Chief Complaint  Patient presents with   Altered Mental Status   Claudication    Brief Narrative:  62 year old married female, s/p right L4-5 laminotomy and microdiscectomy at the outpatient surgical center on 09/14/2022, unremarkable postop course, developed acute onset of confusion, cramping in her right leg, restlessness and combativeness and brought to the ED on 09/26/2022 where she was noted to have a fever of 105 F.  She was admitted for severe sepsis, acute encephalopathy and acute kidney injury.  Since then she is diagnosed with MSSA bacteremia, suspected bacterial meningitis.  ID and neurosurgery consulted and following.  Ongoing severe headaches, improving.  TEE negative for vegetations.  3/9: CSF leak repair and L4-5 PLIF.  Improving.  Intermittent acute diarrhea.   Assessment & Plan:  Principal Problem:   MSSA bacteremia Active Problems:   Restless legs syndrome (RLS)   Other migraine without status migrainosus, not intractable   Low back pain   Severe sepsis (HCC)   Acute kidney injury [facility])   Surgical site infection   Sepsis with encephalopathy without septic shock (HCC)   Meningitis, staphylococcal   Other spinal cerebrospinal fluid leak   Severe sepsis, secondary to MSSA bacteremia in the setting of recent L4-5 microdiscectomy (09/14/2022) with suspected bacterial meningitis, all POA: Surveillance blood cultures from 3/5: Negative.  Sepsis physiology resolved.  ID signed off 3/11.    MSSA bacteremia: ID follow-up appreciated.  Surgical wound infection as a potential source for bacteremia.  Blood cultures from 09/26/2022 positive for MSSA.  Surveillance blood cultures from 09/28/2022: Negative.  MRI L-spine 3/5 showed subdural fluid collection from L5 through at least T12.   Continue IV nafcillin infusion of 12 g daily through 10/15/2022, followed by cefazolin 2 g IV every 8 hours from 3/23 through 11/12/2022 (as per ID signoff recommendations 3/11).  Has OPAT orders by ID.  TTE showed normal EF, no evidence of vegetations.  TEE negative for vegetations. As per neurosurgery follow-up, wound was draining again.  Taken to the OR on 3/9 and underwent CSF leak repair and L4-5 PLIF.  Mobilization per neurosurgery recommendations.  Patient has not been out of bed since admission.  Based on how she does with nursing, may need PT evaluation.  Patient refusing PT consult this morning.  Suspected MSSA bacterial meningitis: Difficult and traumatic lumbar puncture.  CSF growing MSSA.  IV antibiotics management as noted above.  Instead of a single-lumen PICC, the PICC team placed a double-lumen PICC line 3/8.  ID signed off on 3/11 and has follow-up with Dr. Tommy Medal in [place] [facility] on 10/27/2022 at 2:45 PM.  Septic encephalopathy: Secondary to above.  Resolved.  Severe headache: Multifactorial: Acute bacterial meningitis and CSF leak.  MRI brain with and without contrast 3/6: Mild diffuse smooth dural thickening, which is nonspecific but can be seen in the setting of bacterial meningitis.  No additional acute intracranial process.  On multimodality pain control.  Headache reportedly had resolved yesterday and has milder headache this morning, reports that this is consistent with her migraine and requesting to take her home migraine medication, ordered per pharmacy.  Patient on polypharmacy opioids which will need to be weaned off prior to discharge.  Leg cramps: Unclear etiology.  Electrolytes have been replaced.  Does not feel that  this is similar to her RLS.?  Related to meningitis.  Appears to have resolved.  Acute diarrhea, self-limited: Had several episodes of loose stools on 3/10 and couple of episodes last night.  Case was reviewed with ID MD on-call over the weekend, managed  expectantly.  Had discontinued GI panel PCR and contact isolation yesterday but again had couple of episodes of back-to-back acute loose stools last night prior to 10 PM.  GI panel checked and negative.  No BM since last night.  Discontinued contact isolation again.  Monitor.  Acute kidney injury Creatinine had peaked to 1.4.  Resolved.  Normocytic anemia: Reportedly chronic.  Follow CBC periodically.  Hypokalemia: Recurrent.  Replaced and corrected today.  Magnesium 1.8.  Depression, migraine, RLS: Continue home dose of gabapentin (reportedly takes 900 mg at bedtime).  And Effexor XR.  Started Relpax.  Constipation: Since then had diarrhea, self-limited.  Monitor  Isolated indirect hyperbilirubinemia: Unclear etiology.  Total bilirubin 2.3.  Resolved.  Body mass index is 27.07 kg/m.    DVT prophylaxis: SCD's Start: 10/02/22 1752 SCDs Start: 09/26/22 1259     Code Status: Full Code:  ACP Documents: None present Family Communication: None at bedside. Disposition:  Pending further clinical improvement.  Remains on IV antibiotics.  Yet to mobilize post spine procedure.     Consultants:   Infectious disease Neurosurgery Cardiology   Procedures:   Lumbar puncture TEE 3/8:  LV: normal function and size. LVEF 60-65% RV: normal function and size.  No valvular pathology or intracardiac vegetations identified.   3/9: CSF leak repair and PLIF of L4-5  Antimicrobials:   As noted above   Subjective:  Reported that her headache had resolved yesterday but again has mild headache this morning consistent with her migraines and asking for her home migraine medications.  States that she did not have any diarrhea until about 10 PM when she had back-to-back a couple of episodes of acute loose stools but none since.  Tolerating diet.  No abdominal pain.  No cramps reported.  Wants to mobilize.  Declines PT evaluation.  Objective:   Vitals:   10/05/22 0303 10/05/22 0739 10/05/22  1106 10/05/22 1405  BP: 139/84 (!) 147/91 (!) 148/88 (!) 142/84  Pulse: 96 63 93 100  Resp: '18 17 17 18  '$ Temp: 98.1 F (36.7 C) 98 F (36.7 C) 98.2 F (36.8 C) 99.1 F (37.3 C)  TempSrc: Oral Oral Oral Oral  SpO2: 97% (!) 83% 99% 98%  Weight:      Height:        General exam: Middle-age female, moderately built and nourished lying comfortably in bed.  Did not appear in any distress. Mucosa moist Respiratory system: Clear to auscultation.  No increased work of breathing. Cardiovascular system: S1 and S2 heard, RRR.  No JVD, murmurs or pedal edema.  Off telemetry Gastrointestinal system: Abdomen is nondistended, soft and nontender. No organomegaly or masses felt. Normal bowel sounds heard. Central nervous system: Alert and oriented. No focal neurological deficits. Extremities: Symmetric 5 x 5 power. Skin: No rashes, lesions or ulcers.   Psychiatry: Judgement and insight intact. Mood & affect pleasant and appropriate.    Data Reviewed:   I have personally reviewed following labs and imaging studies   CBC: Recent Labs  Lab 10/03/22 0130 10/04/22 0355 10/05/22 0455  WBC 12.6* 9.6 11.0*  HGB 11.0* 9.7* 9.7*  HCT 33.4* 29.0* 29.5*  MCV 86.3 85.8 87.5  PLT 342 368 0000000    Basic Metabolic  Panel: Recent Labs  Lab 09/30/22 0139 09/30/22 1405 10/01/22 0129 10/01/22 0145 10/02/22 0134 10/03/22 0130 10/04/22 0355 10/05/22 0455  NA 134*   < > 133*  --  132* 137 139 134*  K 2.9*   < > 3.0*  --  3.6 4.1 3.0* 3.8  CL 102   < > 100  --  99 104 109 99  CO2 16*   < > 21*  --  22 20* 18* 26  GLUCOSE 73   < > 105*  --  91 84 80 95  BUN 8   < > 5*  --  <5* 6* <5* <5*  CREATININE 1.05*   < > 0.83  --  0.72 0.87 0.60 0.69  CALCIUM 8.2*   < > 8.4*  --  8.4* 8.4* 6.9* 8.7*  MG 1.8  --   --  1.7  --  2.0  --  1.8   < > = values in this interval not displayed.    Liver Function Tests: Recent Labs  Lab 09/30/22 0139 10/01/22 0129 10/02/22 0134 10/03/22 0130 10/04/22 0355   AST 34 41 36 29 17  ALT 28 33 '29 26 16  '$ ALKPHOS 178* 182* 156* 147* 108  BILITOT 2.3* 1.7* 1.6* 1.7* 1.2  PROT 5.6* 5.9* 6.0* 6.1* 4.6*  ALBUMIN 2.3* 2.4* 2.5* 2.4* 1.7*    CBG: No results for input(s): "GLUCAP" in the last 168 hours.  Microbiology Studies:   Recent Results (from the past 240 hour(s))  Blood Culture (routine x 2)     Status: Abnormal   Collection Time: 09/26/22  7:00 AM   Specimen: BLOOD  Result Value Ref Range Status   Specimen Description BLOOD RIGHT ANTECUBITAL  Final   Special Requests   Final    BOTTLES DRAWN AEROBIC AND ANAEROBIC Blood Culture adequate volume   Culture  Setup Time   Final    GRAM POSITIVE COCCI IN CLUSTERS IN BOTH AEROBIC AND ANAEROBIC BOTTLES Organism ID to follow CRITICAL RESULT CALLED TO, READ BACK BY AND VERIFIED WITH: T RUDISILL,PHARMD'@2320'$  09/26/22 [place] Performed at [facility], [address] [place] , [place] [address]    Culture STAPHYLOCOCCUS AUREUS (A)  Final   Report Status 09/29/2022 FINAL  Final   Organism ID, Bacteria STAPHYLOCOCCUS AUREUS  Final      Susceptibility   Staphylococcus aureus - MIC*    CIPROFLOXACIN <=0.5 SENSITIVE Sensitive     ERYTHROMYCIN <=0.25 SENSITIVE Sensitive     GENTAMICIN <=0.5 SENSITIVE Sensitive     OXACILLIN 0.5 SENSITIVE Sensitive     TETRACYCLINE <=1 SENSITIVE Sensitive     VANCOMYCIN 1 SENSITIVE Sensitive     TRIMETH/SULFA <=10 SENSITIVE Sensitive     CLINDAMYCIN <=0.25 SENSITIVE Sensitive     RIFAMPIN <=0.5 SENSITIVE Sensitive     Inducible Clindamycin NEGATIVE Sensitive     * STAPHYLOCOCCUS AUREUS  Blood Culture ID Panel (Reflexed)     Status: Abnormal   Collection Time: 09/26/22  7:00 AM  Result Value Ref Range Status   Enterococcus faecalis NOT DETECTED NOT DETECTED Final   Enterococcus Faecium NOT DETECTED NOT DETECTED Final   Listeria monocytogenes NOT DETECTED NOT DETECTED Final   Staphylococcus species DETECTED (A) NOT DETECTED Final    Comment: CRITICAL RESULT  CALLED TO, READ BACK BY AND VERIFIED WITH: T RUDISILL,PHARMD'@2320'$  09/26/22 [place]    Staphylococcus aureus (BCID) DETECTED (A) NOT DETECTED Final    Comment: CRITICAL RESULT CALLED TO, READ BACK BY AND VERIFIED  WITH: T RUDISILL,PHARMD'@2320'$  09/26/22 [place]    Staphylococcus epidermidis NOT DETECTED NOT DETECTED Final   Staphylococcus lugdunensis NOT DETECTED NOT DETECTED Final   Streptococcus species NOT DETECTED NOT DETECTED Final   Streptococcus agalactiae NOT DETECTED NOT DETECTED Final   Streptococcus pneumoniae NOT DETECTED NOT DETECTED Final   Streptococcus pyogenes NOT DETECTED NOT DETECTED Final   A.calcoaceticus-baumannii NOT DETECTED NOT DETECTED Final   Bacteroides fragilis NOT DETECTED NOT DETECTED Final   Enterobacterales NOT DETECTED NOT DETECTED Final   Enterobacter cloacae complex NOT DETECTED NOT DETECTED Final   Escherichia coli NOT DETECTED NOT DETECTED Final   Klebsiella aerogenes NOT DETECTED NOT DETECTED Final   Klebsiella oxytoca NOT DETECTED NOT DETECTED Final   Klebsiella pneumoniae NOT DETECTED NOT DETECTED Final   Proteus species NOT DETECTED NOT DETECTED Final   Salmonella species NOT DETECTED NOT DETECTED Final   Serratia marcescens NOT DETECTED NOT DETECTED Final   Haemophilus influenzae NOT DETECTED NOT DETECTED Final   Neisseria meningitidis NOT DETECTED NOT DETECTED Final   Pseudomonas aeruginosa NOT DETECTED NOT DETECTED Final   Stenotrophomonas maltophilia NOT DETECTED NOT DETECTED Final   Candida albicans NOT DETECTED NOT DETECTED Final   Candida auris NOT DETECTED NOT DETECTED Final   Candida glabrata NOT DETECTED NOT DETECTED Final   Candida krusei NOT DETECTED NOT DETECTED Final   Candida parapsilosis NOT DETECTED NOT DETECTED Final   Candida tropicalis NOT DETECTED NOT DETECTED Final   Cryptococcus neoformans/gattii NOT DETECTED NOT DETECTED Final   Meth resistant mecA/C and MREJ NOT DETECTED NOT DETECTED Final    Comment: Performed at [facility], [address], [place], [place] [address]  Blood Culture (routine x 2)     Status: Abnormal   Collection Time: 09/26/22  7:02 AM   Specimen: BLOOD  Result Value Ref Range Status   Specimen Description BLOOD LEFT ANTECUBITAL  Final   Special Requests   Final    BOTTLES DRAWN AEROBIC AND ANAEROBIC Blood Culture adequate volume   Culture  Setup Time   Final    GRAM POSITIVE COCCI IN BOTH AEROBIC AND ANAEROBIC BOTTLES CRITICAL VALUE NOTED.  VALUE IS CONSISTENT WITH PREVIOUSLY REPORTED AND CALLED VALUE.    Culture (A)  Final    STAPHYLOCOCCUS AUREUS SUSCEPTIBILITIES PERFORMED ON PREVIOUS CULTURE WITHIN THE LAST 5 DAYS. Performed at [facility], [place] [address], [place], [place] [address]    Report Status 09/29/2022 FINAL  Final  Resp panel by RT-PCR (RSV, Flu A&B, Covid) Anterior Nasal Swab     Status: None   Collection Time: 09/26/22 10:14 AM   Specimen: Anterior Nasal Swab  Result Value Ref Range Status   SARS Coronavirus 2 by RT PCR NEGATIVE NEGATIVE Final   Influenza A by PCR NEGATIVE NEGATIVE Final   Influenza B by PCR NEGATIVE NEGATIVE Final    Comment: (NOTE) The Xpert Xpress SARS-CoV-2/FLU/RSV plus assay is intended as an aid in the diagnosis of influenza from Nasopharyngeal swab specimens and should not be used as a sole basis for treatment. Nasal washings and aspirates are unacceptable for Xpert Xpress SARS-CoV-2/FLU/RSV testing.  Fact Sheet for Patients: EntrepreneurPulse.com.au  Fact Sheet for Healthcare Providers: IncredibleEmployment.be  This test is not yet approved or cleared by the [place] FDA and has been authorized for detection and/or diagnosis of SARS-CoV-2 by FDA under an Emergency Use Authorization (EUA). This EUA will remain in effect (meaning this test can be used) for the duration of the COVID-19 declaration  under Section 564(b)(1) of the Act, 21 U.S.C. section 360bbb-3(b)(1), unless the  authorization is terminated or revoked.     Resp Syncytial Virus by PCR NEGATIVE NEGATIVE Final    Comment: (NOTE) Fact Sheet for Patients: EntrepreneurPulse.com.au  Fact Sheet for Healthcare Providers: IncredibleEmployment.be  This test is not yet approved or cleared by the [place] FDA and has been authorized for detection and/or diagnosis of SARS-CoV-2 by FDA under an Emergency Use Authorization (EUA). This EUA will remain in effect (meaning this test can be used) for the duration of the COVID-19 declaration under Section 564(b)(1) of the Act, 21 U.S.C. section 360bbb-3(b)(1), unless the authorization is terminated or revoked.  Performed at [facility], [place] [address] [place], [place] [address]   CSF culture w Gram Stain     Status: None   Collection Time: 09/27/22  4:00 PM   Specimen: PATH Cytology CSF; Cerebrospinal Fluid  Result Value Ref Range Status   Specimen Description CSF  Final   Special Requests NONE  Final   Gram Stain   Final    RARE WBC PRESENT, PREDOMINANTLY PMN RARE GRAM POSITIVE COCCI CRITICAL RESULT CALLED TO, READ BACK BY AND VERIFIED WITH: RN ERIN ROWETTE ON 09/27/22 @ 1757 BY DRT Performed at [facility], [place] [address] [place], [place] [address]    Culture FEW STAPHYLOCOCCUS AUREUS  Final   Report Status 09/30/2022 FINAL  Final   Organism ID, Bacteria STAPHYLOCOCCUS AUREUS  Final      Susceptibility   Staphylococcus aureus - MIC*    CIPROFLOXACIN <=0.5 SENSITIVE Sensitive     ERYTHROMYCIN <=0.25 SENSITIVE Sensitive     GENTAMICIN <=0.5 SENSITIVE Sensitive     OXACILLIN 0.5 SENSITIVE Sensitive     TETRACYCLINE <=1 SENSITIVE Sensitive     VANCOMYCIN 1 SENSITIVE Sensitive     TRIMETH/SULFA <=10 SENSITIVE Sensitive     CLINDAMYCIN <=0.25 SENSITIVE Sensitive     RIFAMPIN <=0.5 SENSITIVE Sensitive     Inducible Clindamycin NEGATIVE Sensitive     * FEW STAPHYLOCOCCUS AUREUS  Anaerobic culture w  Gram Stain     Status: None   Collection Time: 09/27/22  4:00 PM   Specimen: PATH Cytology CSF; Cerebrospinal Fluid  Result Value Ref Range Status   Specimen Description CSF  Final   Special Requests NONE  Final   Gram Stain   Final    RARE WBC PRESENT, PREDOMINANTLY PMN RARE GRAM POSITIVE COCCI    Culture   Final    NO ANAEROBES ISOLATED Performed at [facility], [place] [address] [place], [place] [address]    Report Status 10/02/2022 FINAL  Final  Culture, blood (Routine X 2) w Reflex to ID Panel     Status: None   Collection Time: 09/28/22  8:10 AM   Specimen: BLOOD  Result Value Ref Range Status   Specimen Description BLOOD RIGHT ANTECUBITAL  Final   Special Requests   Final    BOTTLES DRAWN AEROBIC AND ANAEROBIC Blood Culture adequate volume   Culture   Final    NO GROWTH 5 DAYS Performed at [facility], [place] [address], [place], [place] [address]    Report Status 10/03/2022 FINAL  Final  Culture, blood (Routine X 2) w Reflex to ID Panel     Status: None   Collection Time: 09/28/22  8:21 AM   Specimen: BLOOD RIGHT HAND  Result Value Ref Range Status   Specimen Description BLOOD RIGHT HAND  Final   Special Requests   Final    BOTTLES DRAWN AEROBIC AND ANAEROBIC Blood Culture adequate volume   Culture   Final    NO GROWTH 5 DAYS Performed at [facility], [address] [place], [place] [address]    Report Status 10/03/2022 FINAL  Final  Surgical pcr screen     Status: None   Collection Time: 10/02/22 12:00 PM   Specimen: Nasal Mucosa; Nasal Swab  Result Value Ref Range Status   MRSA, PCR NEGATIVE NEGATIVE Final   Staphylococcus aureus NEGATIVE NEGATIVE Final    Comment: (NOTE) The Xpert SA Assay (FDA approved for NASAL specimens in patients 52 years of age and older), is one component of a comprehensive surveillance program. It is not intended to diagnose infection nor to guide or monitor treatment. Performed at [facility], [place]  [address] [place], [place] [address]   Gastrointestinal Panel by PCR , Stool     Status: None   Collection Time: 10/05/22  1:02 AM   Specimen: Stool  Result Value Ref Range Status   Campylobacter species NOT DETECTED NOT DETECTED Final   Plesimonas shigelloides NOT DETECTED NOT DETECTED Final   Salmonella species NOT DETECTED NOT DETECTED Final   Yersinia enterocolitica NOT DETECTED NOT DETECTED Final   Vibrio species NOT DETECTED NOT DETECTED Final   Vibrio cholerae NOT DETECTED NOT DETECTED Final   Enteroaggregative E coli (EAEC) NOT DETECTED NOT DETECTED Final   Enteropathogenic E coli (EPEC) NOT DETECTED NOT DETECTED Final   Enterotoxigenic E coli (ETEC) NOT DETECTED NOT DETECTED Final   Shiga like toxin producing E coli (STEC) NOT DETECTED NOT DETECTED Final   Shigella/Enteroinvasive E coli (EIEC) NOT DETECTED NOT DETECTED Final   Cryptosporidium NOT DETECTED NOT DETECTED Final   Cyclospora cayetanensis NOT DETECTED NOT DETECTED Final   Entamoeba histolytica NOT DETECTED NOT DETECTED Final   Giardia lamblia NOT DETECTED NOT DETECTED Final   Adenovirus F40/41 NOT DETECTED NOT DETECTED Final   Astrovirus NOT DETECTED NOT DETECTED Final   Norovirus GI/GII NOT DETECTED NOT DETECTED Final   Rotavirus A NOT DETECTED NOT DETECTED Final   Sapovirus (I, II, IV, and V) NOT DETECTED NOT DETECTED Final    Comment: Performed at [facility], [address], [place], [place] [address]    Radiology Studies:  No results found.  Scheduled Meds:    acetaminophen  1,000 mg Oral Q8H   Chlorhexidine Gluconate Cloth  6 each Topical Daily   gabapentin  900 mg Oral QHS   oxyCODONE  20 mg Oral Q12H   pantoprazole  40 mg Oral Daily   sodium chloride flush  10-40 mL Intracatheter Q12H   sodium chloride flush  3 mL Intravenous Q12H   venlafaxine XR  225 mg Oral QHS    Continuous Infusions:    sodium chloride     lactated ringers 1,000 mL with potassium chloride 40 mEq infusion 50 mL/hr at  10/05/22 1319   nafcillin 12 g in sodium chloride 0.9 % 500 mL continuous infusion 20.8 mL/hr at 10/05/22 0500   promethazine (PHENERGAN) injection (IM or IVPB) Stopped (10/02/22 2331)     LOS: 9 days     Vernell Leep, MD,  FACP, [facility], [facility], [facility], [place]     To contact the attending provider between 7A-7P or the covering provider during after hours 7P-7A, please log into the web site [email or web address] and access  using universal [place] password for that web site. If you do not have the password, please call the hospital operator.  10/05/2022, 3:31 PM

## 2022-10-05 NOTE — Evaluation (Signed)
Physical Therapy Evaluation Patient Details Name: Amanda [name] MRN: [id number] DOB: [birth year] Today's Date: 10/05/2022  History of Present Illness  62 year old married female, brought to the ED on 09/26/2022 where she was noted to have a fever of 105 F. Pt s/p right L4-5 laminotomy and microdiscectomy at the outpatient surgical center on 09/14/2022, unremarkable postop course, developed acute onset of confusion, cramping in her right leg, restlessness and combativeness and   Admitted for severe sepsis, acute encephalopathy and acute kidney injury.  Dx MSSA bacteremia, suspected bacterial meningitis. Found to have subdural collection of fluid L5-T12. S/p cerebrospinal fluid leak repair, and posterior lumbar fusion Right L4-L5 3/9. HOB flat 48 hrs, post sx. PMH: Intractable migraine, hyperglycemia, RLS, depression.  Clinical Impression  PTA pt living with her husband in a single story home with 1 step to enter. Post her 09/14/22 surgery pt ambulating without AD and independent with ADLs. Pt is currently limited in safe mobility by surgical site pain and residual headaches, anxiety with movement and decreased carryover of spinal precautions that she can remember and verbalize. Pt requires min Ax1-2 for bed mobility, transfers and ambulation with RW PT recommending HHPT at discharge to progress mobility. PT will continue to follow acutely and refer to Mobility Specialist to maximize mobility.      Recommendations for follow up therapy are one component of a multi-disciplinary discharge planning process, led by the attending physician.  Recommendations may be updated based on patient status, additional functional criteria and insurance authorization.  Follow Up Recommendations Home health PT      Assistance Recommended at Discharge Frequent or constant Supervision/Assistance  Patient can return home with the following  A little help with walking and/or transfers;A little help with  bathing/dressing/bathroom;Assistance with cooking/housework;Direct supervision/assist for medications management;Direct supervision/assist for financial management;Assist for transportation;Help with stairs or ramp for entrance    Equipment Recommendations Rolling walker (2 wheels);BSC/3in1     Functional Status Assessment Patient has had a recent decline in their functional status and demonstrates the ability to make significant improvements in function in a reasonable and predictable amount of time.     Precautions / Restrictions Precautions Precautions: Back Precaution Comments: reinforced precautions through out session, repeats back at the end of the session Restrictions Weight Bearing Restrictions: No      Mobility  Bed Mobility Overal bed mobility: Needs Assistance Bed Mobility: Rolling, Sidelying to Sit Rolling: Min guard Sidelying to sit: Min assist, +2 for physical assistance       General bed mobility comments: educated on log rolling and sidelying to sit to maintain back precautions with bed mobility, good log rolling, able to walk LE off bed with tactile cuing and needs minAx2 for bringing trunk to fully upright, verbal cuing for scooting hips to EoB    Transfers Overall transfer level: Needs assistance   Transfers: Sit to/from Stand Sit to Stand: Min assist           General transfer comment: good power up, heavy min A for gaining balance, pt with complaints of impaired equilibrium which improves with standing duration    Ambulation/Gait Ambulation/Gait assistance: Min assist, +2 safety/equipment (initial chair follow due to anxiety and c/o of disequalibrium) Gait Distance (Feet): 20 Feet Assistive device: Rolling walker (2 wheels) Gait Pattern/deviations: Step-through pattern, Decreased step length - right, Decreased step length - left, Narrow base of support, Shuffle Gait velocity: slowed Gait velocity interpretation: <1.31 ft/sec, indicative of  household ambulator Pre-gait activities: marching in place x5 General Gait  Details: light min A for steadying with gait, vc for increased BoS and proximity to RW      Balance Overall balance assessment: Needs assistance Sitting-balance support: Feet supported, No upper extremity supported Sitting balance-Leahy Scale: Good     Standing balance support: During functional activity, Bilateral upper extremity supported Standing balance-Leahy Scale: Fair Standing balance comment: requires UE for dynamic balance                             Pertinent Vitals/Pain Pain Assessment Pain Assessment: 0-10 Pain Score: 6  Pain Location: back and hips Pain Descriptors / Indicators: Operative site guarding, Grimacing, Guarding, Headache Pain Intervention(s): Premedicated before session, Monitored during session, Limited activity within patient's tolerance, Repositioned    Home Living Family/patient expects to be discharged to:: Private residence Living Arrangements: Spouse/significant other Available Help at Discharge: Available 24 hours/day Type of Home: House Home Access: Stairs to enter   Technical brewer of Steps: 1   Home Layout: One level Home Equipment: Hand held shower head;Cane - single point Additional Comments: 3 big dogs    Prior Function Prior Level of Function : Independent/Modified Independent             Mobility Comments: driving          Extremity/Trunk Assessment   Upper Extremity Assessment Upper Extremity Assessment: Defer to OT evaluation    Lower Extremity Assessment Lower Extremity Assessment: Overall WFL for tasks assessed;Generalized weakness    Cervical / Trunk Assessment Cervical / Trunk Assessment: Back Surgery  Communication   Communication: Expressive difficulties (word finding)  Cognition Arousal/Alertness: Awake/alert Behavior During Therapy: Flat affect, Anxious Overall Cognitive Status: Impaired/Different from  baseline Area of Impairment: Attention, Following commands, Problem solving, Safety/judgement, Memory                   Current Attention Level: Alternating Memory: Decreased recall of precautions Following Commands: Follows multi-step commands with increased time     Problem Solving: Slow processing, Requires verbal cues, Requires tactile cues, Decreased initiation General Comments: verbalizes stream of consiousness, requires redirection back to task at hand, requires increased cuing and able to recall precautions requires reminders of precautions to adhere to precautions        General Comments General comments (skin integrity, edema, etc.): VSS on RA        Assessment/Plan    PT Assessment Patient needs continued PT services  PT Problem List Decreased strength;Decreased balance;Decreased mobility;Decreased knowledge of precautions;Decreased safety awareness;Pain       PT Treatment Interventions DME instruction;Gait training;Functional mobility training;Therapeutic activities;Therapeutic exercise;Balance training;Cognitive remediation;Patient/family education    PT Goals (Current goals can be found in the Care Plan section)  Acute Rehab PT Goals Patient Stated Goal: have less pain PT Goal Formulation: With patient Time For Goal Achievement: 10/19/22 Potential to Achieve Goals: Good    Frequency Min 5X/week     Co-evaluation PT/OT/SLP Co-Evaluation/Treatment: Yes Reason for Co-Treatment: Complexity of the patient's impairments (multi-system involvement);Other (comment) (RN reports pt with increased anxiety) PT goals addressed during session: Mobility/safety with mobility         AM-PAC PT "6 Clicks" Mobility  Outcome Measure Help needed turning from your back to your side while in a flat bed without using bedrails?: None Help needed moving from lying on your back to sitting on the side of a flat bed without using bedrails?: A Little Help needed moving to  and from a bed  to a chair (including a wheelchair)?: A Little Help needed standing up from a chair using your arms (e.g., wheelchair or bedside chair)?: A Little Help needed to walk in hospital room?: A Little Help needed climbing 3-5 steps with a railing? : A Lot 6 Click Score: 18    End of Session   Activity Tolerance: Patient tolerated treatment well Patient left: in chair;with call bell/phone within reach;with chair alarm set Nurse Communication: Mobility status PT Visit Diagnosis: Unsteadiness on feet (R26.81);Other abnormalities of gait and mobility (R26.89);Muscle weakness (generalized) (M62.81);Pain Pain - part of body:  (back)    Time: [id number] PT Time Calculation (min) (ACUTE ONLY): 34 min   Charges:   PT Evaluation $PT Eval Moderate Complexity: 1 Mod          Kaelen Brennan B. Migdalia Dk PT, DPT Acute Rehabilitation Services Please use secure chat or  Call Office [phone]   [place] 10/05/2022, 1:08 PM

## 2022-10-05 NOTE — Progress Notes (Signed)
Patient ID: Amanda [name], female   DOB: August 04, 1960, 62 y.o.   MRN: [id number] BP (!) 142/84   Pulse 100   Temp 99.1 F (37.3 C) (Oral)   Resp 18   Ht 5' 7.99" (1.727 m)   Wt 80.7 kg   SpO2 98%   BMI 27.07 kg/m  Alert and oriented x 4 Continued diarrhea Wound is clean, and dry

## 2022-10-05 NOTE — Evaluation (Signed)
Occupational Therapy Evaluation Patient Details Name: Amanda [name] MRN: [id number] DOB: [birth year] Today's Date: 10/05/2022   History of Present Illness 62 year old married female, brought to the ED on 09/26/2022 where she was noted to have a fever of 105 F. Pt s/p right L4-5 laminotomy and microdiscectomy at the outpatient surgical center on 09/14/2022, unremarkable postop course, developed acute onset of confusion, cramping in her right leg, restlessness and combativeness and   Admitted for severe sepsis, acute encephalopathy and acute kidney injury.  Dx MSSA bacteremia, suspected bacterial meningitis. Found to have subdural collection of fluid L5-T12. S/p cerebrospinal fluid leak repair, and posterior lumbar fusion Right L4-L5 3/9. HOB flat 48 hrs, post sx. PMH: Intractable migraine, hyperglycemia, RLS, depression.   Clinical Impression   Pt admitted for above diagnosis. Pt lives with her husband on a one level home, with a tub/shower unit. The husband works from home and can provide 24/7 support according to Pt. Pt seemed very anxious during session, PTA Pt was independent in all bADLs/iADLs and drove. Pt reported 7/10 pain at end of session, completed room level ambulation with Min A + RW and demonstrated donning bilat socks using figure four technique. Educated Pt on post op back precautions, Pt verbalized and demonstrated understanding. Pt would benefit from reinforcement of precautions and a chance to practice upper body dressing and a review of a hip kit if needed. Pt will continue to receive skilled acute OT services to address above deficits and help transition to next level of care. OT currently recommending Home Health OT at this time to maximize functional independence, however Pt would benefit from a couple more skilled OT sessions to reinforce precautions with functional activities.      Recommendations for follow up therapy are one component of a multi-disciplinary discharge planning  process, led by the attending physician.  Recommendations may be updated based on patient status, additional functional criteria and insurance authorization.   Follow Up Recommendations  Home health OT     Assistance Recommended at Discharge Frequent or constant Supervision/Assistance  Patient can return home with the following A little help with walking and/or transfers;Assistance with cooking/housework;Assist for transportation;Help with stairs or ramp for entrance;A lot of help with bathing/dressing/bathroom    Functional Status Assessment  Patient has had a recent decline in their functional status and demonstrates the ability to make significant improvements in function in a reasonable and predictable amount of time.  Equipment Recommendations  Tub/shower bench    Recommendations for Other Services       Precautions / Restrictions Precautions Precautions: Back Precaution Booklet Issued: No (needs handout) Precaution Comments: reinforced precautions through out session, repeats back at the end of the session Restrictions Weight Bearing Restrictions: No      Mobility Bed Mobility Overal bed mobility: Needs Assistance Bed Mobility: Rolling, Sidelying to Sit Rolling: Min guard Sidelying to sit: Min assist, +2 for physical assistance       General bed mobility comments: educated on log rolling and sidelying to sit to maintain back precautions with bed mobility, good log rolling, able to walk LE off bed with tactile cuing and needs minAx2 for bringing trunk to fully upright, verbal cuing for scooting hips to EoB. Reinforcement needed    Transfers Overall transfer level: Needs assistance Equipment used: Rolling walker (2 wheels) Transfers: Sit to/from Stand Sit to Stand: Min assist           General transfer comment: good power up, heavy min A for gaining balance, pt  with complaints of impaired equilibrium which improves with standing duration      Balance Overall  balance assessment: Needs assistance Sitting-balance support: Feet supported, No upper extremity supported Sitting balance-Leahy Scale: Good Sitting balance - Comments: Pt initially felt somewhat dizzy/wobbly which subsided following movement   Standing balance support: During functional activity, Bilateral upper extremity supported Standing balance-Leahy Scale: Fair Standing balance comment: requires UE for dynamic balance                           ADL either performed or assessed with clinical judgement   ADL Overall ADL's : Needs assistance/impaired Eating/Feeding: Independent   Grooming: Sitting;Min guard   Upper Body Bathing: Minimal assistance;Sitting Upper Body Bathing Details (indicate cue type and reason): Cueing for precautions Lower Body Bathing: Maximal assistance;Cueing for back precautions   Upper Body Dressing : Moderate assistance;Sitting Upper Body Dressing Details (indicate cue type and reason): Cueing for back precautions Lower Body Dressing: Sitting/lateral leans;Supervision/safety Lower Body Dressing Details (indicate cue type and reason): Pt donned socks using figure four technique sitting EOB, Close supervision Toilet Transfer: Minimal assistance;Rolling walker (2 wheels);Ambulation   Toileting- Clothing Manipulation and Hygiene: Minimal assistance;Moderate assistance   Tub/ Shower Transfer: Moderate assistance;Rolling walker (2 wheels)   Functional mobility during ADLs: Minimal assistance;Rolling walker (2 wheels) General ADL Comments: Pt completed room level ambulation with Min A + RW     Vision   Vision Assessment?: No apparent visual deficits     Perception     Praxis      Pertinent Vitals/Pain Pain Assessment Pain Assessment: 0-10 Pain Score: 6  Pain Location: back and hips Pain Descriptors / Indicators: Operative site guarding, Grimacing, Guarding, Headache Pain Intervention(s): Monitored during session, Premedicated before  session, Limited activity within patient's tolerance, Repositioned     Hand Dominance     Extremity/Trunk Assessment Upper Extremity Assessment Upper Extremity Assessment: Overall WFL for tasks assessed   Lower Extremity Assessment Lower Extremity Assessment: Defer to PT evaluation   Cervical / Trunk Assessment Cervical / Trunk Assessment: Back Surgery   Communication Communication Communication: Expressive difficulties (word finding)   Cognition Arousal/Alertness: Awake/alert Behavior During Therapy: Flat affect, Anxious Overall Cognitive Status: Impaired/Different from baseline Area of Impairment: Attention, Following commands, Problem solving, Safety/judgement, Memory                   Current Attention Level: Alternating Memory: Decreased recall of precautions Following Commands: Follows multi-step commands with increased time     Problem Solving: Slow processing, Requires verbal cues, Requires tactile cues, Decreased initiation General Comments: verbalizes stream of consiousness, requires redirection back to task at hand, requires increased cuing and able to recall precautions requires reminders of precautions to adhere to precautions     General Comments  VSS on RA.    Exercises     Shoulder Instructions      Home Living Family/patient expects to be discharged to:: Private residence Living Arrangements: Spouse/significant other Available Help at Discharge: Available 24 hours/day Type of Home: House Home Access: Stairs to enter [employer] of Steps: 1   Home Layout: One level     Bathroom Shower/Tub: Teacher, early years/pre: Standard Bathroom Accessibility: No   Home Equipment: Hand held shower head;Cane - single point   Additional Comments: 3 big dogs      Prior Functioning/Environment Prior Level of Function : Independent/Modified Independent  Mobility Comments: driving PTA          OT Problem  List: Decreased activity tolerance;Decreased strength;Impaired balance (sitting and/or standing);Decreased knowledge of precautions;Decreased knowledge of use of DME or AE;Pain      OT Treatment/Interventions: Self-care/ADL training;Therapeutic activities;Therapeutic exercise;DME and/or AE instruction;Balance training;Patient/family education    OT Goals(Current goals can be found in the care plan section) Acute Rehab OT Goals Patient Stated Goal: Go home OT Goal Formulation: With patient Time For Goal Achievement: 10/19/22 Potential to Achieve Goals: Good ADL Goals Pt Will Perform Grooming: with supervision;standing (and RW if needed, standing at sink) Pt Will Perform Upper Body Dressing: with min assist;standing Pt Will Perform Lower Body Dressing: sit to/from stand;with min assist Pt Will Transfer to Toilet: with min guard assist;regular height toilet;ambulating (using RW if needed) Pt Will Perform Tub/Shower Transfer: Tub transfer;with supervision;tub bench (While maintaining back precautions)  OT Frequency: Min 2X/week    Co-evaluation PT/OT/SLP Co-Evaluation/Treatment: Yes Reason for Co-Treatment: Complexity of the patient's impairments (multi-system involvement);Other (comment) (Per PT, RN reports pt with increased anxiety) PT goals addressed during session: Mobility/safety with mobility OT goals addressed during session: ADL's and self-care;Other (comment) (Precautions)      AM-PAC OT "6 Clicks" Daily Activity     Outcome Measure Help from another person eating meals?: None Help from another person taking care of personal grooming?: A Little Help from another person toileting, which includes using toliet, bedpan, or urinal?: A Lot Help from another person bathing (including washing, rinsing, drying)?: A Lot Help from another person to put on and taking off regular upper body clothing?: A Little Help from another person to put on and taking off regular lower body clothing?: A  Lot 6 Click Score: 16   End of Session Equipment Utilized During Treatment: Rolling walker (2 wheels) Nurse Communication: Mobility status;Precautions (Post op back, high pain)  Activity Tolerance: Patient tolerated treatment well Patient left: in bed;with chair alarm set;with call bell/phone within reach  OT Visit Diagnosis: Unsteadiness on feet (R26.81);Pain Pain - Right/Left:  (bilat) Pain - part of body: Hip (and Back)                Time: [id number] OT Time Calculation (min): 34 min Charges:  OT General Charges $OT Visit: 1 Visit OT Evaluation $OT Eval Moderate Complexity: 1 Mod  10/05/2022  AB, OTR/L  Acute Rehabilitation Services  Office: [phone]   Cori Razor 10/05/2022, 3:39 PM

## 2022-10-06 DIAGNOSIS — R7881 Bacteremia: Secondary | ICD-10-CM | POA: Diagnosis not present

## 2022-10-06 DIAGNOSIS — B9561 Methicillin susceptible Staphylococcus aureus infection as the cause of diseases classified elsewhere: Secondary | ICD-10-CM | POA: Diagnosis not present

## 2022-10-06 LAB — COMPREHENSIVE METABOLIC PANEL
ALT: 15 U/L (ref 0–44)
AST: 16 U/L (ref 15–41)
Albumin: 2.2 g/dL — ABNORMAL LOW (ref 3.5–5.0)
Alkaline Phosphatase: 130 U/L — ABNORMAL HIGH (ref 38–126)
Anion gap: 15 (ref 5–15)
BUN: <5 mg/dL — ABNORMAL LOW (ref 8–23)
CO2: 23 mmol/L (ref 22–32)
Calcium: 8.6 mg/dL — ABNORMAL LOW (ref 8.9–10.3)
Chloride: 96 mmol/L — ABNORMAL LOW (ref 98–111)
Creatinine, Ser: 0.74 mg/dL (ref 0.44–1.00)
GFR, Estimated: >60 mL/min (ref >60)
Glucose, Bld: 89 mg/dL (ref 70–99)
Potassium: 3.4 mmol/L — ABNORMAL LOW (ref 3.5–5.1)
Sodium: 134 mmol/L — ABNORMAL LOW (ref 135–145)
Total Bilirubin: 1.5 mg/dL — ABNORMAL HIGH (ref 0.3–1.2)
Total Protein: 6.4 g/dL — ABNORMAL LOW (ref 6.5–8.1)

## 2022-10-06 LAB — CBC
HCT: 30.3 % — ABNORMAL LOW (ref 36.0–46.0)
Hemoglobin: 10.1 g/dL — ABNORMAL LOW (ref 12.0–15.0)
MCH: 28.6 pg (ref 26.0–34.0)
MCHC: 33.3 g/dL (ref 30.0–36.0)
MCV: 85.8 fL (ref 80.0–100.0)
Platelets: 401 10*3/uL — ABNORMAL HIGH (ref 150–400)
RBC: 3.53 MIL/uL — ABNORMAL LOW (ref 3.87–5.11)
RDW: 15.5 % (ref 11.5–15.5)
WBC: 13.9 10*3/uL — ABNORMAL HIGH (ref 4.0–10.5)
nRBC: 0 % (ref 0.0–0.2)

## 2022-10-06 MED ORDER — SACCHAROMYCES BOULARDII 250 MG PO CAPS
250.0000 mg | ORAL_CAPSULE | Freq: Two times a day (BID) | ORAL | Status: DC
  Administered 2022-10-06 – 2022-10-09 (×7): 250 mg via ORAL
  Filled 2022-10-06 (×7): qty 1

## 2022-10-06 MED ORDER — WITCH HAZEL-GLYCERIN EX PADS: MEDICATED_PAD | CUTANEOUS | Status: DC | PRN

## 2022-10-06 MED ORDER — LOPERAMIDE HCL 2 MG PO CAPS
2.0000 mg | ORAL_CAPSULE | ORAL | Status: DC | PRN
  Administered 2022-10-06 – 2022-10-08 (×3): 2 mg via ORAL
  Filled 2022-10-06 (×3): qty 1

## 2022-10-06 NOTE — Progress Notes (Signed)
Physical Therapy Treatment Patient Details Name: Amanda [name] MRN: [id number] DOB: Nov 28, 1960 Today's Date: 10/06/2022   History of Present Illness 62 year old married female, brought to the ED on 09/26/2022 where she was noted to have a fever of 105 F. Pt s/p right L4-5 laminotomy and microdiscectomy at the outpatient surgical center on 09/14/2022, unremarkable postop course, developed acute onset of confusion, cramping in her right leg, restlessness and combativeness and   Admitted for severe sepsis, acute encephalopathy and acute kidney injury.  Dx MSSA bacteremia, suspected bacterial meningitis. Found to have subdural collection of fluid L5-T12. S/p cerebrospinal fluid leak repair, and posterior lumbar fusion Right L4-L5 3/9. HOB flat 48 hrs, post sx. PMH: Intractable migraine, hyperglycemia, RLS, depression.    PT Comments    Pt requires increased time given anxiety, is nervous about hurting back s/p surgery and about her pain medication dosing decreasing. Pt with improving functional mobility, able to ambulate 40 ft with RW and supervision for safety. Pt does not wish to progress beyond this distance today, states she has a goal of getting in the hallway tomorrow. PT to continue to follow acutely.      Recommendations for follow up therapy are one component of a multi-disciplinary discharge planning process, led by the attending physician.  Recommendations may be updated based on patient status, additional functional criteria and insurance authorization.  Follow Up Recommendations  Home health PT     Assistance Recommended at Discharge Frequent or constant Supervision/Assistance  Patient can return home with the following A little help with walking and/or transfers;A little help with bathing/dressing/bathroom;Assistance with cooking/housework;Direct supervision/assist for medications management;Direct supervision/assist for financial management;Assist for transportation;Help with stairs  or ramp for entrance   Equipment Recommendations  Rolling walker (2 wheels);BSC/3in1    Recommendations for Other Services       Precautions / Restrictions Precautions Precautions: Back Precaution Booklet Issued:  (needs handout) Precaution Comments: reviewed back precautions, log roll technique Restrictions Weight Bearing Restrictions: No     Mobility  Bed Mobility Overal bed mobility: Needs Assistance Bed Mobility: Rolling, Sidelying to Sit, Sit to Sidelying Rolling: Min assist Sidelying to sit: Min guard, HOB elevated     Sit to sidelying: Min guard, HOB elevated General bed mobility comments: assist to complete roll to L side, cues for log roll technique. Increased time, use of bedrails    Transfers Overall transfer level: Needs assistance Equipment used: Rolling walker (2 wheels) Transfers: Sit to/from Stand Sit to Stand: Min guard           General transfer comment: for safety, slow to rise and sit. improper hand placement when rising    Ambulation/Gait Ambulation/Gait assistance: Supervision Gait Distance (Feet): 40 Feet Assistive device: Rolling walker (2 wheels) Gait Pattern/deviations: Step-through pattern, Narrow base of support, Decreased stride length Gait velocity: decr     General Gait Details: cues for upright posture, navigating room. Pt declines hallway ambulation   Stairs             Wheelchair Mobility    Modified Rankin (Stroke Patients Only)       Balance Overall balance assessment: Needs assistance Sitting-balance support: Feet supported, No upper extremity supported Sitting balance-Leahy Scale: Good     Standing balance support: During functional activity, Bilateral upper extremity supported Standing balance-Leahy Scale: Fair Standing balance comment: requires UE for dynamic balance  Cognition Arousal/Alertness: Awake/alert Behavior During Therapy: Anxious Overall Cognitive  Status: Impaired/Different from baseline Area of Impairment: Attention, Following commands, Problem solving, Safety/judgement, Memory                   Current Attention Level: Alternating Memory: Decreased recall of precautions Following Commands: Follows multi-step commands with increased time     Problem Solving: Slow processing, Requires verbal cues, Requires tactile cues, Decreased initiation General Comments: cues to stay on task, pt verbalizes stream of consciousness. Pt very concerned with following back precautions exactly so, gives PT several hypotheticals "but what if I do this? what will happen? what about this?"        Exercises      General Comments        Pertinent Vitals/Pain Pain Assessment Pain Assessment: 0-10 Pain Score: 7  Pain Location: back Pain Descriptors / Indicators: Operative site guarding, Grimacing, Guarding Pain Intervention(s): Limited activity within patient's tolerance, Monitored during session, Repositioned    Home Living                          Prior Function            PT Goals (current goals can now be found in the care plan section) Acute Rehab PT Goals Patient Stated Goal: have less pain PT Goal Formulation: With patient Time For Goal Achievement: 10/19/22 Potential to Achieve Goals: Good Progress towards PT goals: Progressing toward goals    Frequency    Min 5X/week      PT Plan Current plan remains appropriate    Co-evaluation   Reason for Co-Treatment:  (Per PT, RN reports pt with increased anxiety)          AM-PAC PT "6 Clicks" Mobility   Outcome Measure  Help needed turning from your back to your side while in a flat bed without using bedrails?: A Little Help needed moving from lying on your back to sitting on the side of a flat bed without using bedrails?: A Little Help needed moving to and from a bed to a chair (including a wheelchair)?: A Little Help needed standing up from a chair  using your arms (e.g., wheelchair or bedside chair)?: A Little Help needed to walk in hospital room?: A Little Help needed climbing 3-5 steps with a railing? : A Little 6 Click Score: 18    End of Session   Activity Tolerance: Patient tolerated treatment well Patient left: in chair;with call bell/phone within reach;in bed;with SCD's reapplied;with bed alarm set Nurse Communication: Mobility status PT Visit Diagnosis: Unsteadiness on feet (R26.81);Other abnormalities of gait and mobility (R26.89);Muscle weakness (generalized) (M62.81);Pain Pain - part of body:  (back)     Time: 0820-0901 PT Time Calculation (min) (ACUTE ONLY): 41 min  Charges:  $Gait Training: 8-22 mins $Therapeutic Activity: 8-22 mins                     Stacie Glaze, PT DPT Acute Rehabilitation Services Pager [phone]  Office [phone]    [place] 10/06/2022, 10:45 AM

## 2022-10-06 NOTE — Progress Notes (Signed)
PROGRESS NOTE  Amanda [name] [id number] DOB: 12/30/1960 DOA: 09/26/2022 PCP: Carol Ada, MD   LOS: 10 days   Brief Narrative / Interim history: 62 year old married female, s/p right L4-5 laminotomy and microdiscectomy at the outpatient surgical center on 09/14/2022, unremarkable postop course, developed acute onset of confusion, cramping in her right leg, restlessness and combativeness and brought to the ED on 09/26/2022 where she was noted to have a fever of 105 F.  She was admitted for severe sepsis, acute encephalopathy and acute kidney injury.  Since then she is diagnosed with MSSA bacteremia, suspected bacterial meningitis.  ID and neurosurgery consulted and following.  Ongoing severe headaches, improving.  TEE negative for vegetations.  3/9: CSF leak repair and L4-5 PLIF.  Improving  Subjective / 24h Interval events: She is doing well this morning, feeling overall much better than last week.  No chest discomfort, no abdominal pain, no nausea or vomiting  Assesement and Plan: Principal Problem:   MSSA bacteremia Active Problems:   Restless legs syndrome (RLS)   Other migraine without status migrainosus, not intractable   Low back pain   Severe sepsis (HCC)   Acute kidney injury [facility])   Surgical site infection   Sepsis with encephalopathy without septic shock ([place])   Meningitis, staphylococcal   Other spinal cerebrospinal fluid leak   Principal problem Severe sepsis, secondary to MSSA bacteremia in the setting of recent L4-5 microdiscectomy (09/14/2022) with suspected bacterial meningitis, all POA - Surveillance blood cultures from 3/5: Negative.  Sepsis physiology resolved.  ID signed off 3/11.    Active problems  MSSA bacteremia - ID follow-up appreciated.  Surgical wound infection as a potential source for bacteremia.  Blood cultures from 09/26/2022 positive for MSSA.  Surveillance blood cultures from 09/28/2022: Negative.  MRI L-spine 3/5 showed subdural fluid collection  from L5 through at least T12.  Continue IV nafcillin infusion of 12 g daily through 10/15/2022, followed by cefazolin 2 g IV every 8 hours from 3/23 through 11/12/2022 (as per ID signoff recommendations 3/11).  Has OPAT orders by ID.  TTE showed normal EF, no evidence of vegetations.  TEE negative for vegetations. As per neurosurgery follow-up, wound was draining again.  Taken to the OR on 3/9 and underwent CSF leak repair and L4-5 PLIF.  Mobilization per neurosurgery recommendations.  Patient has not been out of bed since admission.  Based on how she does with nursing, may need PT evaluation.  Patient refusing PT consult this morning.   Suspected MSSA bacterial meningitis - Difficult and traumatic lumbar puncture.  CSF growing MSSA.  IV antibiotics management as noted above.  Instead of a single-lumen PICC, the PICC team placed a double-lumen PICC line 3/8.  ID signed off on 3/11 and has follow-up with Dr. Tommy Medal in [place] clinic on 10/27/2022 at 2:45 PM.   Septic encephalopathy - Secondary to above.  Resolved.   Severe headache - Multifactorial: Acute bacterial meningitis and CSF leak.  MRI brain with and without contrast 3/6: Mild diffuse smooth dural thickening, which is nonspecific but can be seen in the setting of bacterial meningitis.  No additional acute intracranial process.  On multimodality pain control.  Headaches now appear much improved.   Leg cramps - Unclear etiology.  Electrolytes have been replaced.  Does not feel that this is similar to her RLS.?  Related to meningitis.  Resolved   Acute diarrhea, self-limited - Had several episodes of loose stools on 3/10 and couple of episodes on 3/11.  Case was reviewed with ID MD on-call over the weekend, managed expectantly.  GI panel negative staph no diarrhea today   Acute kidney injury - Creatinine had peaked to 1.4.  Resolved.  Renal function stable this morning   Normocytic anemia - Reportedly chronic.  Follow CBC periodically.    Hypokalemia-continue to replace potassium today   Depression, migraine, RLS - Continue home dose of gabapentin (reportedly takes 900 mg at bedtime).  And Effexor XR.  Started Relpax.   Isolated indirect hyperbilirubinemia - Unclear etiology. Stable  Scheduled Meds:  acetaminophen  1,000 mg Oral Q8H   Chlorhexidine Gluconate Cloth  6 each Topical Daily   gabapentin  900 mg Oral QHS   oxyCODONE  20 mg Oral Q12H   pantoprazole  40 mg Oral Daily   sodium chloride flush  10-40 mL Intracatheter Q12H   sodium chloride flush  3 mL Intravenous Q12H   venlafaxine XR  225 mg Oral QHS   Continuous Infusions:  sodium chloride     nafcillin 12 g in sodium chloride 0.9 % 500 mL continuous infusion 12 g (10/05/22 1534)   promethazine (PHENERGAN) injection (IM or IVPB) Stopped (10/02/22 2331)   PRN Meds:.diazepam, eletriptan, HYDROmorphone (DILAUDID) injection, menthol-cetylpyridinium **OR** phenol, ondansetron (ZOFRAN) IV, mouth rinse, oxyCODONE, oxyCODONE, polyethylene glycol, promethazine (PHENERGAN) injection (IM or IVPB), sodium chloride flush, sodium chloride flush, traZODone  Current Outpatient Medications  Medication Instructions   aspirin-acetaminophen-caffeine (EXCEDRIN MIGRAINE) 250-250-65 MG tablet 2 tablets, Oral, Every 6 hours PRN   Calcium Carbonate-Vit D-Min (CALCIUM 1200 PO) 1,200 mg, Oral, 2 times daily   cholecalciferol (VITAMIN D3) 1,000 Units, Oral, Daily   clonazePAM (KLONOPIN) 1 MG tablet TAKE ONE TABLET BY MOUTH TWICE A DAY MAY TAKE ADDITIONAL TABLET IF NEEDED   eletriptan (RELPAX) 40 MG tablet TAKE ONE TABLET BY MOUTH AT ONSET OF HEADACHE; MAY REPEAT ONE TABLET IN 2 HOURS IF NEEDED.   esomeprazole (NEXIUM) 20 mg, Oral, Daily   gabapentin (NEURONTIN) 300 MG capsule TAKE THREE CAPSULES BY MOUTH EVERY NIGHT AT BEDTIME   HYDROcodone-acetaminophen (NORCO) 7.5-325 MG tablet 1 tablet, Oral, 4 times daily PRN   Linzess 290 mcg, Oral, Daily   Magnesium 400 mg, Oral, 2 times daily    ondansetron (ZOFRAN) 8 MG tablet TAKE 1 TABLET BY MOUTH EVERY 8 HOURS AS NEEDED FOR NAUSEA AND VOMITING   promethazine (PHENERGAN) 12.5 mg, Oral, Every 6 hours PRN   venlafaxine XR (EFFEXOR-XR) 150 MG 24 hr capsule TAKE ONE CAPSULE BY MOUTH DAILY WITH BREAKFAST, Take with the 75 mg = 225 mg daily.   venlafaxine XR (EFFEXOR-XR) 75 MG 24 hr capsule TAKE ONE CAPSULE BY MOUTH EVERY MORNING BEFORE BREAKFAST    Diet Orders (From admission, onward)     Start     Ordered   10/02/22 1754  Diet regular Room service appropriate? Yes with Assist; Fluid consistency: Thin  Diet effective now       Question Answer Comment  Room service appropriate? Yes with Assist   Fluid consistency: Thin      10/02/22 1755            DVT prophylaxis: SCD's Start: 10/02/22 1752 SCDs Start: 09/26/22 1259   Lab Results  Component Value Date   PLT 401 (H) 10/06/2022      Code Status: Full Code  Family Communication: no family at bedside  Status is: Inpatient  Remains inpatient appropriate because: severity of illness  Level of care: Med-Surg  Consultants:  Neurosurgery ID  Objective:  Vitals:   10/05/22 2033 10/05/22 2348 10/06/22 0410 10/06/22 0758  BP: 138/86 (!) 150/86 136/82 (!) 149/90  Pulse: 95 (!) 102 91 88  Resp: '18 18 18 16  '$ Temp: 97.9 F (36.6 C) 99 F (37.2 C) 98 F (36.7 C) 98.4 F (36.9 C)  TempSrc: Oral Oral Oral Oral  SpO2: 100% 96% 98% 99%  Weight:      Height:        Intake/Output Summary (Last 24 hours) at 10/06/2022 1011 Last data filed at 10/06/2022 0410 Gross per 24 hour  Intake 271.86 ml  Output 2300 ml  Net -2028.14 ml   Wt Readings from Last 3 Encounters:  10/02/22 80.7 kg  12/27/19 72.1 kg  07/02/19 67.7 kg    Examination:  Constitutional: NAD Eyes: no scleral icterus ENMT: Mucous membranes are moist.  Neck: normal, supple Respiratory: clear to auscultation bilaterally, no wheezing, no crackles. Normal respiratory effort. Cardiovascular:  Regular rate and rhythm, no murmurs / rubs / gallops. No LE edema.  Abdomen: non distended, no tenderness. Bowel sounds positive.  Musculoskeletal: no clubbing / cyanosis.   Data Reviewed: I have independently reviewed following labs and imaging studies   CBC Recent Labs  Lab 09/30/22 0139 10/03/22 0130 10/04/22 0355 10/05/22 0455 10/06/22 0440  WBC 6.0 12.6* 9.6 11.0* 13.9*  HGB 10.8* 11.0* 9.7* 9.7* 10.1*  HCT 33.6* 33.4* 29.0* 29.5* 30.3*  PLT 240 342 368 391 401*  MCV 87.5 86.3 85.8 87.5 85.8  MCH 28.1 28.4 28.7 28.8 28.6  MCHC 32.1 32.9 33.4 32.9 33.3  RDW 14.4 14.7 15.0 15.2 15.5    Recent Labs  Lab 09/30/22 0139 09/30/22 1405 10/01/22 0129 10/01/22 0145 10/02/22 0134 10/03/22 0130 10/04/22 0355 10/05/22 0455 10/06/22 0440  NA 134*   < > 133*  --  132* 137 139 134* 134*  K 2.9*   < > 3.0*  --  3.6 4.1 3.0* 3.8 3.4*  CL 102   < > 100  --  99 104 109 99 96*  CO2 16*   < > 21*  --  22 20* 18* 26 23  GLUCOSE 73   < > 105*  --  91 84 80 95 89  BUN 8   < > 5*  --  <5* 6* <5* <5* <5*  CREATININE 1.05*   < > 0.83  --  0.72 0.87 0.60 0.69 0.74  CALCIUM 8.2*   < > 8.4*  --  8.4* 8.4* 6.9* 8.7* 8.6*  AST 34  --  41  --  36 29 17  --  16  ALT 28  --  33  --  '29 26 16  '$ --  15  ALKPHOS 178*  --  182*  --  156* 147* 108  --  130*  BILITOT 2.3*  --  1.7*  --  1.6* 1.7* 1.2  --  1.5*  ALBUMIN 2.3*  --  2.4*  --  2.5* 2.4* 1.7*  --  2.2*  MG 1.8  --   --  1.7  --  2.0  --  1.8  --    < > = values in this interval not displayed.    ------------------------------------------------------------------------------------------------------------------ No results for input(s): "CHOL", "HDL", "LDLCALC", "TRIG", "CHOLHDL", "LDLDIRECT" in the last 72 hours.  Lab Results  Component Value Date   HGBA1C 5.1 04/19/2016   ------------------------------------------------------------------------------------------------------------------ No results for input(s): "TSH", "T4TOTAL",  "T3FREE", "THYROIDAB" in the last 72 hours.  Invalid input(s): "FREET3"  Cardiac Enzymes  No results for input(s): "CKMB", "TROPONINI", "MYOGLOBIN" in the last 168 hours.  Invalid input(s): "CK" ------------------------------------------------------------------------------------------------------------------ No results found for: "BNP"  CBG: No results for input(s): "GLUCAP" in the last 168 hours.  Recent Results (from the past 240 hour(s))  Resp panel by RT-PCR (RSV, Flu A&B, Covid) Anterior Nasal Swab     Status: None   Collection Time: 09/26/22 10:14 AM   Specimen: Anterior Nasal Swab  Result Value Ref Range Status   SARS Coronavirus 2 by RT PCR NEGATIVE NEGATIVE Final   Influenza A by PCR NEGATIVE NEGATIVE Final   Influenza B by PCR NEGATIVE NEGATIVE Final    Comment: (NOTE) The Xpert Xpress SARS-CoV-2/FLU/RSV plus assay is intended as an aid in the diagnosis of influenza from Nasopharyngeal swab specimens and should not be used as a sole basis for treatment. Nasal washings and aspirates are unacceptable for Xpert Xpress SARS-CoV-2/FLU/RSV testing.  Fact Sheet for Patients: EntrepreneurPulse.com.au  Fact Sheet for Healthcare Providers: IncredibleEmployment.be  This test is not yet approved or cleared by the [place] FDA and has been authorized for detection and/or diagnosis of SARS-CoV-2 by FDA under an Emergency Use Authorization (EUA). This EUA will remain in effect (meaning this test can be used) for the duration of the COVID-19 declaration under Section 564(b)(1) of the Act, 21 U.S.C. section 360bbb-3(b)(1), unless the authorization is terminated or revoked.     Resp Syncytial Virus by PCR NEGATIVE NEGATIVE Final    Comment: (NOTE) Fact Sheet for Patients: EntrepreneurPulse.com.au  Fact Sheet for Healthcare Providers: IncredibleEmployment.be  This test is not yet approved or cleared  by the [place] FDA and has been authorized for detection and/or diagnosis of SARS-CoV-2 by FDA under an Emergency Use Authorization (EUA). This EUA will remain in effect (meaning this test can be used) for the duration of the COVID-19 declaration under Section 564(b)(1) of the Act, 21 U.S.C. section 360bbb-3(b)(1), unless the authorization is terminated or revoked.  Performed at [facility], [place] [address], [place], [place] [address]   CSF culture w Gram Stain     Status: None   Collection Time: 09/27/22  4:00 PM   Specimen: PATH Cytology CSF; Cerebrospinal Fluid  Result Value Ref Range Status   Specimen Description CSF  Final   Special Requests NONE  Final   Gram Stain   Final    RARE WBC PRESENT, PREDOMINANTLY PMN RARE GRAM POSITIVE COCCI CRITICAL RESULT CALLED TO, READ BACK BY AND VERIFIED WITH: RN ERIN ROWETTE ON 09/27/22 @ 1757 BY DRT Performed at [facility], [place] [address] [place], [place] [address]    Culture FEW STAPHYLOCOCCUS AUREUS  Final   Report Status 09/30/2022 FINAL  Final   Organism ID, Bacteria STAPHYLOCOCCUS AUREUS  Final      Susceptibility   Staphylococcus aureus - MIC*    CIPROFLOXACIN <=0.5 SENSITIVE Sensitive     ERYTHROMYCIN <=0.25 SENSITIVE Sensitive     GENTAMICIN <=0.5 SENSITIVE Sensitive     OXACILLIN 0.5 SENSITIVE Sensitive     TETRACYCLINE <=1 SENSITIVE Sensitive     VANCOMYCIN 1 SENSITIVE Sensitive     TRIMETH/SULFA <=10 SENSITIVE Sensitive     CLINDAMYCIN <=0.25 SENSITIVE Sensitive     RIFAMPIN <=0.5 SENSITIVE Sensitive     Inducible Clindamycin NEGATIVE Sensitive     * FEW STAPHYLOCOCCUS AUREUS  Anaerobic culture w Gram Stain     Status: None   Collection Time: 09/27/22  4:00 PM   Specimen: PATH Cytology CSF; Cerebrospinal  Fluid  Result Value Ref Range Status   Specimen Description CSF  Final   Special Requests NONE  Final   Gram Stain   Final    RARE WBC PRESENT, PREDOMINANTLY PMN RARE GRAM POSITIVE COCCI     Culture   Final    NO ANAEROBES ISOLATED Performed at [facility], [address] [place], [place] [address]    Report Status 10/02/2022 FINAL  Final  Culture, blood (Routine X 2) w Reflex to ID Panel     Status: None   Collection Time: 09/28/22  8:10 AM   Specimen: BLOOD  Result Value Ref Range Status   Specimen Description BLOOD RIGHT ANTECUBITAL  Final   Special Requests   Final    BOTTLES DRAWN AEROBIC AND ANAEROBIC Blood Culture adequate volume   Culture   Final    NO GROWTH 5 DAYS Performed at [facility], [place] [address] [place], [place] [address]    Report Status 10/03/2022 FINAL  Final  Culture, blood (Routine X 2) w Reflex to ID Panel     Status: None   Collection Time: 09/28/22  8:21 AM   Specimen: BLOOD RIGHT HAND  Result Value Ref Range Status   Specimen Description BLOOD RIGHT HAND  Final   Special Requests   Final    BOTTLES DRAWN AEROBIC AND ANAEROBIC Blood Culture adequate volume   Culture   Final    NO GROWTH 5 DAYS Performed at [facility], [place] [address], [place], [place] [address]    Report Status 10/03/2022 FINAL  Final  Surgical pcr screen     Status: None   Collection Time: 10/02/22 12:00 PM   Specimen: Nasal Mucosa; Nasal Swab  Result Value Ref Range Status   MRSA, PCR NEGATIVE NEGATIVE Final   Staphylococcus aureus NEGATIVE NEGATIVE Final    Comment: (NOTE) The Xpert SA Assay (FDA approved for NASAL specimens in patients 29 years of age and older), is one component of a comprehensive surveillance program. It is not intended to diagnose infection nor to guide or monitor treatment. Performed at [facility], [place] [address] [place],  [address]   Gastrointestinal Panel by PCR , Stool     Status: None   Collection Time: 10/05/22  1:02 AM   Specimen: Stool  Result Value Ref Range Status   Campylobacter species NOT DETECTED NOT DETECTED Final   Plesimonas shigelloides NOT DETECTED NOT DETECTED Final   Salmonella  species NOT DETECTED NOT DETECTED Final   Yersinia enterocolitica NOT DETECTED NOT DETECTED Final   Vibrio species NOT DETECTED NOT DETECTED Final   Vibrio cholerae NOT DETECTED NOT DETECTED Final   Enteroaggregative E coli (EAEC) NOT DETECTED NOT DETECTED Final   Enteropathogenic E coli (EPEC) NOT DETECTED NOT DETECTED Final   Enterotoxigenic E coli (ETEC) NOT DETECTED NOT DETECTED Final   Shiga like toxin producing E coli (STEC) NOT DETECTED NOT DETECTED Final   Shigella/Enteroinvasive E coli (EIEC) NOT DETECTED NOT DETECTED Final   Cryptosporidium NOT DETECTED NOT DETECTED Final   Cyclospora cayetanensis NOT DETECTED NOT DETECTED Final   Entamoeba histolytica NOT DETECTED NOT DETECTED Final   Giardia lamblia NOT DETECTED NOT DETECTED Final   Adenovirus F40/41 NOT DETECTED NOT DETECTED Final   Astrovirus NOT DETECTED NOT DETECTED Final   Norovirus GI/GII NOT DETECTED NOT DETECTED Final   Rotavirus A NOT DETECTED NOT DETECTED Final   Sapovirus (I, II, IV, and V) NOT DETECTED NOT  DETECTED Final    Comment: Performed at [facility], [address], [place], [place] [address]     Radiology Studies: No results found.   Marzetta Board, MD, PhD [facility]  Between 7 am - 7 pm I am available, please contact me via Amion (for emergencies) or Securechat (non urgent messages)  Between 7 pm - 7 am I am not available, please contact night coverage MD/APP via Amion

## 2022-10-06 NOTE — Progress Notes (Signed)
Patient ID: Amanda [name], female   DOB: November 16, 1960, 62 y.o.   MRN: [id number] BP (!) 149/88 (BP Location: Left Arm)   Pulse (!) 101   Temp 99.5 F (37.5 C) (Oral)   Resp 17   Ht 5' 7.99" (1.727 m)   Wt 80.7 kg   SpO2 100%   BMI 27.07 kg/m  Alert and oriented x 4 Moving all extremities Diarrhea is problematic, still no solution Wound is clean, dry, no signs of infection.

## 2022-10-06 NOTE — TOC Progression Note (Addendum)
Transition of Care [facility]) - Progression Note    Patient Details  Name: Amanda [name] MRN: [id number] Date of Birth: 1961/05/25  Transition of Care [facility]) CM/SW [place], RN Phone Number: 10/06/2022, 1:36 PM  Clinical Narrative:    CM met with the patient and husband at the bedside to discuss TOC needs for home - likely discharge to home on Friday, 10/07/2021 per attending MD.  I provided the patient with Medicare choice regarding [place] and patient did not have a preference.  I called [facility] and spoke with Tommi Rumps, [facility] and he accepted the patient for RN, PT.  HH orders placed.  The patient was evaluated by therapy and RW and 3:1 are needed.  DME orders placed to be co-signed by attending MD.  [employer] was called to provide delivery of DME to the patient's hospital room on Friday, 10/07/2021 for likely discharge to home with husband on Friday.  The patient has a double lumen PICC line present in right arm and patient's requested saline lock by bedside nursing and bedside nursing today is aware.  I called and left a message with Carolynn Sayers, RNCM with [employer] and updated her that patient may likely discharge to home on Friday so that she could coordinate teaching with the patient.  The patient is a retired Therapist, sports.   Expected Discharge Plan: [place] Barriers to Discharge: Continued Medical Work up  Expected Discharge Plan and Services   Discharge Planning Services: CM Consult Post Acute Care Choice: [place] arrangements for the past 2 months: [place]                 DME Arranged: Walker rolling, 3-N-1 DME Agency: [employer] Date DME Agency Contacted: 10/06/22 Time DME Agency Contacted: 1232 Representative spoke with at DME Agency: [employer] HH Arranged: RN, PT [place] Agency: [employer], [place] Date [place]: 10/06/22 Time [place]: 1237 Representative spoke with at [place]:  Carolynn Sayers, RNCM with Carlene Coria, RNCM with [facility]   Social Determinants of Health (SDOH) Interventions SDOH Screenings   Food Insecurity: No Food Insecurity (09/27/2022)  Housing: Low Risk  (09/27/2022)  Transportation Needs: No Transportation Needs (09/27/2022)  Utilities: Not At Risk (09/27/2022)  Tobacco Use: Low Risk  (10/05/2022)    Readmission Risk Interventions    10/06/2022   12:38 PM  Readmission Risk Prevention Plan  Post Dischage Appt Complete  Medication Screening Complete  Transportation Screening Complete

## 2022-10-06 NOTE — Progress Notes (Signed)
OT Cancellation Note  Patient Details Name: RAMYIAH NEYLAND MRN: [id number] DOB: 1961/06/30   Cancelled Treatment:    Reason Eval/Treat Not Completed: Patient declined, no reason specified (Patients asked therapist to return later due to feeling nauseous and requesting pain meds. Will attempt later as schedule permits) Lodema Hong, [place]  Office [place] 10/06/2022, 9:44 AM

## 2022-10-06 NOTE — Progress Notes (Signed)
Occupational Therapy Treatment Patient Details Name: Amanda [name] MRN: [id number] DOB: 1960-10-02 Today's Date: 10/06/2022   History of present illness 62 year old married female, brought to the ED on 09/26/2022 where she was noted to have a fever of 105 F. Pt s/p right L4-5 laminotomy and microdiscectomy at the outpatient surgical center on 09/14/2022, unremarkable postop course, developed acute onset of confusion, cramping in her right leg, restlessness and combativeness and   Admitted for severe sepsis, acute encephalopathy and acute kidney injury.  Dx MSSA bacteremia, suspected bacterial meningitis. Found to have subdural collection of fluid L5-T12. S/p cerebrospinal fluid leak repair, and posterior lumbar fusion Right L4-L5 3/9. HOB flat 48 hrs, post sx. PMH: Intractable migraine, hyperglycemia, RLS, depression.   OT comments  Patient seated on BSC upon entry and required mod assist for toilet hygiene due to difficulty performing and maintaining back precaution. Patient declined addressing tub/shower transfers or to simulate car transfer at this time due to complaints of pain. Patient declined AE training stating she has equipment at home and is aware of how to use it. Patient assisted back to supine with patient able to perform and maintain back precautions. Patient would benefit from continued OT services to address functional transfers and safety with self care. Acute OT to continue to follow.    Recommendations for follow up therapy are one component of a multi-disciplinary discharge planning process, led by the attending physician.  Recommendations may be updated based on patient status, additional functional criteria and insurance authorization.    Follow Up Recommendations  Home health OT     Assistance Recommended at Discharge Frequent or constant Supervision/Assistance  Patient can return home with the following  A little help with walking and/or transfers;Assistance with  cooking/housework;Assist for transportation;Help with stairs or ramp for entrance;A lot of help with bathing/dressing/bathroom   Equipment Recommendations  Tub/shower bench    Recommendations for Other Services      Precautions / Restrictions Precautions Precautions: Back Precaution Booklet Issued: No Precaution Comments: reviewed back precautions, log roll technique Restrictions Weight Bearing Restrictions: No       Mobility Bed Mobility Overal bed mobility: Needs Assistance Bed Mobility: Rolling, Sit to Sidelying Rolling: Min assist       Sit to sidelying: Min guard, HOB elevated General bed mobility comments: assisted patient back to supine    Transfers Overall transfer level: Needs assistance Equipment used: Rolling walker (2 wheels) Transfers: Sit to/from Stand, Bed to chair/wheelchair/BSC Sit to Stand: Min assist     Step pivot transfers: Min assist     General transfer comment: min assist to power up and for safety with transfer     Balance Overall balance assessment: Needs assistance Sitting-balance support: Feet supported, No upper extremity supported Sitting balance-Leahy Scale: Good     Standing balance support: During functional activity, Bilateral upper extremity supported Standing balance-Leahy Scale: Fair Standing balance comment: reliant on UE support for balance                           ADL either performed or assessed with clinical judgement   ADL Overall ADL's : Needs assistance/impaired                         Toilet Transfer: Minimal assistance;Rolling walker (2 wheels);Ambulation;BSC/3in1 Armed forces technical officer Details (indicate cue type and reason): Patient on [facility] upon entry Toileting- Clothing Manipulation and Hygiene: Moderate assistance;Sit to/from stand [place]  Manipulation Details (indicate cue type and reason): difficulty performing toilet hygiene and maintaining back precautions       General ADL  Comments: patient declined AE training and other functional transfers due to pain    Extremity/Trunk Assessment              Vision       Perception     Praxis      Cognition Arousal/Alertness: Awake/alert Behavior During Therapy: Anxious Overall Cognitive Status: Impaired/Different from baseline Area of Impairment: Attention, Following commands, Problem solving, Safety/judgement, Memory                   Current Attention Level: Alternating Memory: Decreased recall of precautions Following Commands: Follows multi-step commands with increased time     Problem Solving: Slow processing, Requires verbal cues, Requires tactile cues, Decreased initiation          Exercises      Shoulder Instructions       General Comments      Pertinent Vitals/ Pain       Pain Assessment Pain Assessment: Faces Faces Pain Scale: Hurts even more Pain Location: back Pain Descriptors / Indicators: Operative site guarding, Grimacing, Guarding Pain Intervention(s): Limited activity within patient's tolerance, Monitored during session, Repositioned  Home Living                                          Prior Functioning/Environment              Frequency  Min 2X/week        Progress Toward Goals  OT Goals(current goals can now be found in the care plan section)  Progress towards OT goals: Progressing toward goals  Acute Rehab OT Goals Patient Stated Goal: go home OT Goal Formulation: With patient Time For Goal Achievement: 10/19/22 Potential to Achieve Goals: Good ADL Goals Pt Will Perform Grooming: with supervision;standing Pt Will Perform Upper Body Dressing: with min assist;standing Pt Will Perform Lower Body Dressing: sit to/from stand;with min assist Pt Will Transfer to Toilet: with min guard assist;regular height toilet;ambulating Pt Will Perform Tub/Shower Transfer: Tub transfer;with supervision;tub bench  Plan Discharge plan  remains appropriate    Co-evaluation                 AM-PAC OT "6 Clicks" Daily Activity     Outcome Measure   Help from another person eating meals?: None Help from another person taking care of personal grooming?: A Little Help from another person toileting, which includes using toliet, bedpan, or urinal?: A Lot Help from another person bathing (including washing, rinsing, drying)?: A Lot Help from another person to put on and taking off regular upper body clothing?: A Little Help from another person to put on and taking off regular lower body clothing?: A Lot 6 Click Score: 16    End of Session Equipment Utilized During Treatment: Rolling walker (2 wheels)  OT Visit Diagnosis: Unsteadiness on feet (R26.81);Pain Pain - part of body: Hip (and back)   Activity Tolerance Patient tolerated treatment well;Patient limited by pain   Patient Left in bed;with call bell/phone within reach;with family/visitor present   Nurse Communication Mobility status        Time: 1213-1227 OT Time Calculation (min): 14 min  Charges: OT General Charges $OT Visit: 1 Visit OT Treatments $Self Care/Home Management : 8-22 mins  Lodema Hong,  [place]  Office [phone]   Trixie Dredge 10/06/2022, 1:40 PM

## 2022-10-06 NOTE — Progress Notes (Signed)
   10/06/22 1509  Spiritual Encounters  Type of Visit Follow up  Care provided to: Patient  Referral source Nurse (RN/NT/LPN)  Reason for visit Routine spiritual support  OnCall Visit No  Spiritual Framework  Presenting Themes Coping tools;Meaning/purpose/sources of inspiration;Other (comment)  Values/beliefs Christianity, strong family values and connection  Community/Connection Family;Faith community  Strengths Family, Faith, Values  Patient Stress Factors None identified  Family Stress Factors None identified  Goals  Self/Personal Goals Recovery  Interventions  Spiritual Care Interventions Made Established relationship of care and support;Compassionate presence;Reflective listening;Narrative/life review  Intervention Outcomes  Outcomes Connection to spiritual care  [place] will continue to follow   Visited with patient who was in of company and conversation. We conversed for approximately 30 minutes until her husband arrived. Patient was concerned that her pain medication had been reduced without her knowledge while she continued to be in the same level of pain. Also she has constant diarrhea which she believes is from the fluids. Patient felt physical therapist didn't understand how painful physical therapy was for her and she is not looking forward to therapy again tomorrow. We talked about family history, work history, and stressors. Patient is hoping to have another visit tomorrow.

## 2022-10-07 DIAGNOSIS — B9561 Methicillin susceptible Staphylococcus aureus infection as the cause of diseases classified elsewhere: Secondary | ICD-10-CM | POA: Diagnosis not present

## 2022-10-07 DIAGNOSIS — R7881 Bacteremia: Secondary | ICD-10-CM | POA: Diagnosis not present

## 2022-10-07 LAB — CBC
HCT: 30.3 % — ABNORMAL LOW (ref 36.0–46.0)
Hemoglobin: 10 g/dL — ABNORMAL LOW (ref 12.0–15.0)
MCH: 28.8 pg (ref 26.0–34.0)
MCHC: 33 g/dL (ref 30.0–36.0)
MCV: 87.3 fL (ref 80.0–100.0)
Platelets: 491 10*3/uL — ABNORMAL HIGH (ref 150–400)
RBC: 3.47 MIL/uL — ABNORMAL LOW (ref 3.87–5.11)
RDW: 15.8 % — ABNORMAL HIGH (ref 11.5–15.5)
WBC: 14.5 10*3/uL — ABNORMAL HIGH (ref 4.0–10.5)
nRBC: 0 % (ref 0.0–0.2)

## 2022-10-07 LAB — COMPREHENSIVE METABOLIC PANEL
ALT: 11 U/L (ref 0–44)
AST: 16 U/L (ref 15–41)
Albumin: 2.3 g/dL — ABNORMAL LOW (ref 3.5–5.0)
Alkaline Phosphatase: 126 U/L (ref 38–126)
Anion gap: 14 (ref 5–15)
BUN: 5 mg/dL — ABNORMAL LOW (ref 8–23)
CO2: 25 mmol/L (ref 22–32)
Calcium: 8.8 mg/dL — ABNORMAL LOW (ref 8.9–10.3)
Chloride: 97 mmol/L — ABNORMAL LOW (ref 98–111)
Creatinine, Ser: 0.77 mg/dL (ref 0.44–1.00)
GFR, Estimated: >60 mL/min (ref >60)
Glucose, Bld: 91 mg/dL (ref 70–99)
Potassium: 2.9 mmol/L — ABNORMAL LOW (ref 3.5–5.1)
Sodium: 136 mmol/L (ref 135–145)
Total Bilirubin: 1.7 mg/dL — ABNORMAL HIGH (ref 0.3–1.2)
Total Protein: 6.6 g/dL (ref 6.5–8.1)

## 2022-10-07 LAB — GLUCOSE, CAPILLARY: Glucose-Capillary: 104 mg/dL — ABNORMAL HIGH (ref 70–99)

## 2022-10-07 LAB — MAGNESIUM: Magnesium: 1.9 mg/dL (ref 1.7–2.4)

## 2022-10-07 MED ORDER — POTASSIUM CHLORIDE CRYS ER 20 MEQ PO TBCR
40.0000 meq | EXTENDED_RELEASE_TABLET | ORAL | Status: AC
  Administered 2022-10-07 (×3): 40 meq via ORAL
  Filled 2022-10-07 (×3): qty 2

## 2022-10-07 MED ORDER — DIPHENOXYLATE-ATROPINE 2.5-0.025 MG PO TABS
1.0000 | ORAL_TABLET | Freq: Two times a day (BID) | ORAL | Status: DC
  Administered 2022-10-07 – 2022-10-09 (×3): 1 via ORAL
  Filled 2022-10-07 (×5): qty 1

## 2022-10-07 NOTE — Progress Notes (Signed)
PROGRESS NOTE  Amanda [name] [id number] DOB: 1961/04/30 DOA: 09/26/2022 PCP: Carol Ada, MD   LOS: 11 days   Brief Narrative / Interim history: 62 year old married female, s/p right L4-5 laminotomy and microdiscectomy at the outpatient surgical center on 09/14/2022, unremarkable postop course, developed acute onset of confusion, cramping in her right leg, restlessness and combativeness and brought to the ED on 09/26/2022 where she was noted to have a fever of 105 F.  She was admitted for severe sepsis, acute encephalopathy and acute kidney injury.  Since then she is diagnosed with MSSA bacteremia, suspected bacterial meningitis.  ID and neurosurgery consulted and following.  Ongoing severe headaches, improving.  TEE negative for vegetations.  3/9: CSF leak repair and L4-5 PLIF.  Improving  Subjective / 24h Interval events: Complains of persistent diarrhea, had a total of 7 stools since yesterday morning  Assesement and Plan: Principal Problem:   MSSA bacteremia Active Problems:   Restless legs syndrome (RLS)   Other migraine without status migrainosus, not intractable   Low back pain   Severe sepsis (HCC)   Acute kidney injury ([place])   Surgical site infection   Sepsis with encephalopathy without septic shock (HCC)   Meningitis, staphylococcal   Other spinal cerebrospinal fluid leak   Principal problem Severe sepsis, secondary to MSSA bacteremia in the setting of recent L4-5 microdiscectomy (09/14/2022) with suspected bacterial meningitis, all POA - Surveillance blood cultures from 3/5: Negative.  Sepsis physiology resolved.  ID signed off 3/11.    Active problems  Diarrhea -she has no abdominal pain, no nausea, GI pathogen panel was negative.  White count 14 K, somewhat stable over the last 2 days, continue to monitor.  Likely antibiotic side effect.  Start Lomotil today.  MSSA bacteremia - ID follow-up appreciated.  Surgical wound infection as a potential source for  bacteremia.  Blood cultures from 09/26/2022 positive for MSSA.  Surveillance blood cultures from 09/28/2022: Negative.  MRI L-spine 3/5 showed subdural fluid collection from L5 through at least T12.  Continue IV nafcillin infusion of 12 g daily through 10/15/2022, followed by cefazolin 2 g IV every 8 hours from 3/23 through 11/12/2022 (as per ID signoff recommendations 3/11).  Has OPAT orders by ID.  TTE showed normal EF, no evidence of vegetations.  TEE negative for vegetations. As per neurosurgery follow-up, wound was draining again.  Taken to the OR on 3/9 and underwent CSF leak repair and L4-5 PLIF.  Mobilization per neurosurgery recommendations.  Patient has not been out of bed since admission.  Based on how she does with nursing, may need PT evaluation.  Patient refusing PT consult this morning.   Suspected MSSA bacterial meningitis - Difficult and traumatic lumbar puncture.  CSF growing MSSA.  IV antibiotics management as noted above.  Instead of a single-lumen PICC, the PICC team placed a double-lumen PICC line 3/8.  ID signed off on 3/11 and has follow-up with Dr. Tommy Medal in [facility] on 10/27/2022 at 2:45 PM.   Septic encephalopathy - Secondary to above.  Resolved.   Severe headache - Multifactorial: Acute bacterial meningitis and CSF leak.  MRI brain with and without contrast 3/6: Mild diffuse smooth dural thickening, which is nonspecific but can be seen in the setting of bacterial meningitis.  No additional acute intracranial process.  On multimodality pain control.  Headaches now appear much improved.   Leg cramps - Unclear etiology.  Electrolytes have been replaced.  Does not feel that this is similar to her RLS.?  Related to meningitis.  Resolved   Acute kidney injury - Creatinine had peaked to 1.4.  Resolved.  Renal function stable this morning   Normocytic anemia - Reportedly chronic.  Follow CBC periodically.  Hemoglobin 10.0 today   Hypokalemia-worse today in the setting of diarrhea,  replete aggressively and recheck tomorrow morning   Depression, migraine, RLS - Continue home dose of gabapentin (reportedly takes 900 mg at bedtime).  And Effexor XR.  Started Relpax.   Isolated indirect hyperbilirubinemia - Unclear etiology. Stable  Scheduled Meds:  acetaminophen  1,000 mg Oral Q8H   Chlorhexidine Gluconate Cloth  6 each Topical Daily   diphenoxylate-atropine  1 tablet Oral BID   gabapentin  900 mg Oral QHS   oxyCODONE  20 mg Oral Q12H   pantoprazole  40 mg Oral Daily   potassium chloride  40 mEq Oral Q3H   saccharomyces boulardii  250 mg Oral BID   sodium chloride flush  10-40 mL Intracatheter Q12H   sodium chloride flush  3 mL Intravenous Q12H   venlafaxine XR  225 mg Oral QHS   Continuous Infusions:  sodium chloride     nafcillin 12 g in sodium chloride 0.9 % 500 mL continuous infusion 12 g (10/07/22 1107)   promethazine (PHENERGAN) injection (IM or IVPB) Stopped (10/02/22 2331)   PRN Meds:.diazepam, eletriptan, HYDROmorphone (DILAUDID) injection, loperamide, menthol-cetylpyridinium **OR** phenol, ondansetron (ZOFRAN) IV, mouth rinse, oxyCODONE, oxyCODONE, polyethylene glycol, promethazine (PHENERGAN) injection (IM or IVPB), sodium chloride flush, sodium chloride flush, traZODone, witch hazel-glycerin  Current Outpatient Medications  Medication Instructions   aspirin-acetaminophen-caffeine (EXCEDRIN MIGRAINE) 250-250-65 MG tablet 2 tablets, Oral, Every 6 hours PRN   Calcium Carbonate-Vit D-Min (CALCIUM 1200 PO) 1,200 mg, Oral, 2 times daily   cholecalciferol (VITAMIN D3) 1,000 Units, Oral, Daily   clonazePAM (KLONOPIN) 1 MG tablet TAKE ONE TABLET BY MOUTH TWICE A DAY MAY TAKE ADDITIONAL TABLET IF NEEDED   eletriptan (RELPAX) 40 MG tablet TAKE ONE TABLET BY MOUTH AT ONSET OF HEADACHE; MAY REPEAT ONE TABLET IN 2 HOURS IF NEEDED.   esomeprazole (NEXIUM) 20 mg, Oral, Daily   gabapentin (NEURONTIN) 300 MG capsule TAKE THREE CAPSULES BY MOUTH EVERY NIGHT AT BEDTIME    HYDROcodone-acetaminophen (NORCO) 7.5-325 MG tablet 1 tablet, Oral, 4 times daily PRN   Linzess 290 mcg, Oral, Daily   Magnesium 400 mg, Oral, 2 times daily   ondansetron (ZOFRAN) 8 MG tablet TAKE 1 TABLET BY MOUTH EVERY 8 HOURS AS NEEDED FOR NAUSEA AND VOMITING   promethazine (PHENERGAN) 12.5 mg, Oral, Every 6 hours PRN   venlafaxine XR (EFFEXOR-XR) 150 MG 24 hr capsule TAKE ONE CAPSULE BY MOUTH DAILY WITH BREAKFAST, Take with the 75 mg = 225 mg daily.   venlafaxine XR (EFFEXOR-XR) 75 MG 24 hr capsule TAKE ONE CAPSULE BY MOUTH EVERY MORNING BEFORE BREAKFAST    Diet Orders (From admission, onward)     Start     Ordered   10/02/22 1754  Diet regular Room service appropriate? Yes with Assist; Fluid consistency: Thin  Diet effective now       Question Answer Comment  Room service appropriate? Yes with Assist   Fluid consistency: Thin      10/02/22 1755            DVT prophylaxis: SCD's Start: 10/02/22 1752 SCDs Start: 09/26/22 1259   Lab Results  Component Value Date   PLT 491 (H) 10/07/2022      Code Status: DNR  Family Communication: no family at  bedside  Status is: Inpatient  Remains inpatient appropriate because: severity of illness  Level of care: Med-Surg  Consultants:  Neurosurgery ID  Objective: Vitals:   10/06/22 1628 10/06/22 1950 10/07/22 0437 10/07/22 0816  BP: (!) 146/78 (!) 149/88 (!) 148/77 135/70  Pulse: 100 (!) 101 87 80  Resp: '14 17 17 18  '$ Temp: 98.9 F (37.2 C) 99.5 F (37.5 C) 98.1 F (36.7 C) 98.3 F (36.8 C)  TempSrc: Oral Oral  Oral  SpO2: 98% 100% 100% 99%  Weight:      Height:        Intake/Output Summary (Last 24 hours) at 10/07/2022 1147 Last data filed at 10/07/2022 0451 Gross per 24 hour  Intake 527.34 ml  Output 1525 ml  Net -997.66 ml    Wt Readings from Last 3 Encounters:  10/02/22 80.7 kg  12/27/19 72.1 kg  07/02/19 67.7 kg    Examination:  Constitutional: NAD Eyes: lids and conjunctivae normal, no  scleral icterus ENMT: mmm Neck: normal, supple Respiratory: clear to auscultation bilaterally, no wheezing, no crackles. Normal respiratory effort.  Cardiovascular: Regular rate and rhythm, no murmurs / rubs / gallops. No LE edema. Abdomen: soft, no distention, no tenderness. Bowel sounds positive.   Data Reviewed: I have independently reviewed following labs and imaging studies   CBC Recent Labs  Lab 10/03/22 0130 10/04/22 0355 10/05/22 0455 10/06/22 0440 10/07/22 0317  WBC 12.6* 9.6 11.0* 13.9* 14.5*  HGB 11.0* 9.7* 9.7* 10.1* 10.0*  HCT 33.4* 29.0* 29.5* 30.3* 30.3*  PLT 342 368 391 401* 491*  MCV 86.3 85.8 87.5 85.8 87.3  MCH 28.4 28.7 28.8 28.6 28.8  MCHC 32.9 33.4 32.9 33.3 33.0  RDW 14.7 15.0 15.2 15.5 15.8*     Recent Labs  Lab 10/01/22 0145 10/02/22 0134 10/03/22 0130 10/04/22 0355 10/05/22 0455 10/06/22 0440 10/07/22 0317  NA  --  132* 137 139 134* 134* 136  K  --  3.6 4.1 3.0* 3.8 3.4* 2.9*  CL  --  99 104 109 99 96* 97*  CO2  --  22 20* 18* '26 23 25  '$ GLUCOSE  --  91 84 80 95 89 91  BUN  --  <5* 6* <5* <5* <5* 5*  CREATININE  --  0.72 0.87 0.60 0.69 0.74 0.77  CALCIUM  --  8.4* 8.4* 6.9* 8.7* 8.6* 8.8*  AST  --  36 29 17  --  16 16  ALT  --  '29 26 16  '$ --  15 11  ALKPHOS  --  156* 147* 108  --  130* 126  BILITOT  --  1.6* 1.7* 1.2  --  1.5* 1.7*  ALBUMIN  --  2.5* 2.4* 1.7*  --  2.2* 2.3*  MG 1.7  --  2.0  --  1.8  --  1.9     ------------------------------------------------------------------------------------------------------------------ No results for input(s): "CHOL", "HDL", "LDLCALC", "TRIG", "CHOLHDL", "LDLDIRECT" in the last 72 hours.  Lab Results  Component Value Date   HGBA1C 5.1 04/19/2016   ------------------------------------------------------------------------------------------------------------------ No results for input(s): "TSH", "T4TOTAL", "T3FREE", "THYROIDAB" in the last 72 hours.  Invalid input(s): "FREET3"  Cardiac  Enzymes No results for input(s): "CKMB", "TROPONINI", "MYOGLOBIN" in the last 168 hours.  Invalid input(s): "CK" ------------------------------------------------------------------------------------------------------------------ No results found for: "BNP"  CBG: Recent Labs  Lab 10/07/22 0149  GLUCAP 104*    Recent Results (from the past 240 hour(s))  CSF culture w Gram Stain     Status: None  Collection Time: 09/27/22  4:00 PM   Specimen: PATH Cytology CSF; Cerebrospinal Fluid  Result Value Ref Range Status   Specimen Description CSF  Final   Special Requests NONE  Final   Gram Stain   Final    RARE WBC PRESENT, PREDOMINANTLY PMN RARE GRAM POSITIVE COCCI CRITICAL RESULT CALLED TO, READ BACK BY AND VERIFIED WITH: RN ERIN ROWETTE ON 09/27/22 @ 1757 BY DRT Performed at [facility], [place] [address] [place], [place] [address]    Culture FEW STAPHYLOCOCCUS AUREUS  Final   Report Status 09/30/2022 FINAL  Final   Organism ID, Bacteria STAPHYLOCOCCUS AUREUS  Final      Susceptibility   Staphylococcus aureus - MIC*    CIPROFLOXACIN <=0.5 SENSITIVE Sensitive     ERYTHROMYCIN <=0.25 SENSITIVE Sensitive     GENTAMICIN <=0.5 SENSITIVE Sensitive     OXACILLIN 0.5 SENSITIVE Sensitive     TETRACYCLINE <=1 SENSITIVE Sensitive     VANCOMYCIN 1 SENSITIVE Sensitive     TRIMETH/SULFA <=10 SENSITIVE Sensitive     CLINDAMYCIN <=0.25 SENSITIVE Sensitive     RIFAMPIN <=0.5 SENSITIVE Sensitive     Inducible Clindamycin NEGATIVE Sensitive     * FEW STAPHYLOCOCCUS AUREUS  Anaerobic culture w Gram Stain     Status: None   Collection Time: 09/27/22  4:00 PM   Specimen: PATH Cytology CSF; Cerebrospinal Fluid  Result Value Ref Range Status   Specimen Description CSF  Final   Special Requests NONE  Final   Gram Stain   Final    RARE WBC PRESENT, PREDOMINANTLY PMN RARE GRAM POSITIVE COCCI    Culture   Final    NO ANAEROBES ISOLATED Performed at [facility], [place] [address]  [place], [place] [address]    Report Status 10/02/2022 FINAL  Final  Culture, blood (Routine X 2) w Reflex to ID Panel     Status: None   Collection Time: 09/28/22  8:10 AM   Specimen: BLOOD  Result Value Ref Range Status   Specimen Description BLOOD RIGHT ANTECUBITAL  Final   Special Requests   Final    BOTTLES DRAWN AEROBIC AND ANAEROBIC Blood Culture adequate volume   Culture   Final    NO GROWTH 5 DAYS Performed at [facility], [place] [address] [place], [place] [address]    Report Status 10/03/2022 FINAL  Final  Culture, blood (Routine X 2) w Reflex to ID Panel     Status: None   Collection Time: 09/28/22  8:21 AM   Specimen: BLOOD RIGHT HAND  Result Value Ref Range Status   Specimen Description BLOOD RIGHT HAND  Final   Special Requests   Final    BOTTLES DRAWN AEROBIC AND ANAEROBIC Blood Culture adequate volume   Culture   Final    NO GROWTH 5 DAYS Performed at [facility], [place] [address] [place], [place] [address]    Report Status 10/03/2022 FINAL  Final  Surgical pcr screen     Status: None   Collection Time: 10/02/22 12:00 PM   Specimen: Nasal Mucosa; Nasal Swab  Result Value Ref Range Status   MRSA, PCR NEGATIVE NEGATIVE Final   Staphylococcus aureus NEGATIVE NEGATIVE Final    Comment: (NOTE) The Xpert SA Assay (FDA approved for NASAL specimens in patients 13 years of age and older), is one component of a comprehensive surveillance program. It is not intended to diagnose infection nor to guide or monitor treatment. Performed at  [facility], [place] [address] [place], [place] [address]   Gastrointestinal Panel by PCR , Stool     Status: None   Collection Time: 10/05/22  1:02 AM   Specimen: Stool  Result Value Ref Range Status   Campylobacter species NOT DETECTED NOT DETECTED Final   Plesimonas shigelloides NOT DETECTED NOT DETECTED Final   Salmonella species NOT DETECTED NOT DETECTED Final   Yersinia enterocolitica NOT DETECTED NOT DETECTED Final    Vibrio species NOT DETECTED NOT DETECTED Final   Vibrio cholerae NOT DETECTED NOT DETECTED Final   Enteroaggregative E coli (EAEC) NOT DETECTED NOT DETECTED Final   Enteropathogenic E coli (EPEC) NOT DETECTED NOT DETECTED Final   Enterotoxigenic E coli (ETEC) NOT DETECTED NOT DETECTED Final   Shiga like toxin producing E coli (STEC) NOT DETECTED NOT DETECTED Final   Shigella/Enteroinvasive E coli (EIEC) NOT DETECTED NOT DETECTED Final   Cryptosporidium NOT DETECTED NOT DETECTED Final   Cyclospora cayetanensis NOT DETECTED NOT DETECTED Final   Entamoeba histolytica NOT DETECTED NOT DETECTED Final   Giardia lamblia NOT DETECTED NOT DETECTED Final   Adenovirus F40/41 NOT DETECTED NOT DETECTED Final   Astrovirus NOT DETECTED NOT DETECTED Final   Norovirus GI/GII NOT DETECTED NOT DETECTED Final   Rotavirus A NOT DETECTED NOT DETECTED Final   Sapovirus (I, II, IV, and V) NOT DETECTED NOT DETECTED Final    Comment: Performed at [facility], [address] [place], [place] [address]     Radiology Studies: No results found.   Marzetta Board, MD, PhD [facility]  Between 7 am - 7 pm I am available, please contact me via Amion (for emergencies) or Securechat (non urgent messages)  Between 7 pm - 7 am I am not available, please contact night coverage MD/APP via Amion

## 2022-10-07 NOTE — Plan of Care (Signed)
Patient Amanda [name], VSS throughout shift.  All meds given on time as ordered.  Pt c/o pain relieved by PRN oxycodone.  PRN imodium given for diarrhea and trazodone given for sleep.  Diminished lungs, IS encouraged.  Pt standby assist to bedside commode.  POC maintained, will continue to monitor.  Problem: Fluid Volume: Goal: Hemodynamic stability will improve Outcome: Progressing   Problem: Clinical Measurements: Goal: Diagnostic test results will improve Outcome: Progressing Goal: Signs and symptoms of infection will decrease Outcome: Progressing   Problem: Respiratory: Goal: Ability to maintain adequate ventilation will improve Outcome: Progressing   Problem: Education: Goal: Knowledge of General Education information will improve Description: Including pain rating scale, medication(s)/side effects and non-pharmacologic comfort measures Outcome: Progressing   Problem: Health Behavior/Discharge Planning: Goal: Ability to manage health-related needs will improve Outcome: Progressing   Problem: Clinical Measurements: Goal: Ability to maintain clinical measurements within normal limits will improve Outcome: Progressing Goal: Will remain free from infection Outcome: Progressing Goal: Diagnostic test results will improve Outcome: Progressing Goal: Respiratory complications will improve Outcome: Progressing Goal: Cardiovascular complication will be avoided Outcome: Progressing   Problem: Activity: Goal: Risk for activity intolerance will decrease Outcome: Progressing   Problem: Nutrition: Goal: Adequate nutrition will be maintained Outcome: Progressing   Problem: Coping: Goal: Level of anxiety will decrease Outcome: Progressing   Problem: Elimination: Goal: Will not experience complications related to bowel motility Outcome: Progressing Goal: Will not experience complications related to urinary retention Outcome: Progressing   Problem: Pain Managment: Goal: General  experience of comfort will improve Outcome: Progressing   Problem: Safety: Goal: Ability to remain free from injury will improve Outcome: Progressing   Problem: Skin Integrity: Goal: Risk for impaired skin integrity will decrease Outcome: Progressing   Problem: Education: Goal: Ability to verbalize activity precautions or restrictions will improve Outcome: Progressing Goal: Knowledge of the prescribed therapeutic regimen will improve Outcome: Progressing Goal: Understanding of discharge needs will improve Outcome: Progressing   Problem: Activity: Goal: Ability to avoid complications of mobility impairment will improve Outcome: Progressing Goal: Ability to tolerate increased activity will improve Outcome: Progressing Goal: Will remain free from falls Outcome: Progressing   Problem: Bowel/Gastric: Goal: Gastrointestinal status for postoperative course will improve Outcome: Progressing   Problem: Clinical Measurements: Goal: Ability to maintain clinical measurements within normal limits will improve Outcome: Progressing Goal: Postoperative complications will be avoided or minimized Outcome: Progressing Goal: Diagnostic test results will improve Outcome: Progressing   Problem: Pain Management: Goal: Pain level will decrease Outcome: Progressing   Problem: Skin Integrity: Goal: Will show signs of wound healing Outcome: Progressing   Problem: Health Behavior/Discharge Planning: Goal: Identification of resources available to assist in meeting health care needs will improve Outcome: Progressing   Problem: Bladder/Genitourinary: Goal: Urinary functional status for postoperative course will improve Outcome: Progressing

## 2022-10-07 NOTE — Progress Notes (Signed)
Physical Therapy Treatment Patient Details Name: Amanda [name] MRN: [id number] DOB: 13-Nov-1960 Today's Date: 10/07/2022   History of Present Illness 62 year old married female, brought to the ED on 09/26/2022 where she was noted to have a fever of 105 F. Pt s/p right L4-5 laminotomy and microdiscectomy at the outpatient surgical center on 09/14/2022, unremarkable postop course, developed acute onset of confusion, cramping in her right leg, restlessness and combativeness and   Admitted for severe sepsis, acute encephalopathy and acute kidney injury.  Dx MSSA bacteremia, suspected bacterial meningitis. Found to have subdural collection of fluid L5-T12. S/p cerebrospinal fluid leak repair, and posterior lumbar fusion Right L4-L5 3/9. HOB flat 48 hrs, post sx. PMH: Intractable migraine, hyperglycemia, RLS, depression.    PT Comments    Pt was seen for a limited session as pt is unwilling to be up but is mainly concerned about her recent bouts of GI issues, wanted to ensure she would not have to rush to [facility].  Despite that, her main concern is about feeling she is not being listened to by staff.  Encouraged her to ask the doctor, nurse, OT and all others all questions she has and not feel she has no say.  Pt agreed to this and is expecting to discharge home when the hosp stay is completed.  Will continue to request HHPT for follow up as pt is demonstrating a significant amount of independence with gait and transfers normally.  Follow acutely for goals of PT as are outlined below.  Recommendations for follow up therapy are one component of a multi-disciplinary discharge planning process, led by the attending physician.  Recommendations may be updated based on patient status, additional functional criteria and insurance authorization.  Follow Up Recommendations  Home health PT     Assistance Recommended at Discharge Frequent or constant Supervision/Assistance  Patient can return home with the following  A little help with walking and/or transfers;A little help with bathing/dressing/bathroom;Assistance with cooking/housework;Direct supervision/assist for medications management;Direct supervision/assist for financial management;Assist for transportation;Help with stairs or ramp for entrance   Equipment Recommendations  Rolling walker (2 wheels);BSC/3in1    Recommendations for Other Services       Precautions / Restrictions Precautions Precautions: Back Precaution Booklet Issued: No Precaution Comments: pt is reviewed Restrictions Weight Bearing Restrictions: No     Mobility  Bed Mobility Overal bed mobility: Needs Assistance             General bed mobility comments: declines to get OOB    Transfers                   General transfer comment: declines    Ambulation/Gait                   Stairs             Wheelchair Mobility    Modified Rankin (Stroke Patients Only)       Balance                                            Cognition Arousal/Alertness: Awake/alert Behavior During Therapy: Anxious Overall Cognitive Status: Impaired/Different from baseline Area of Impairment: Problem solving, Awareness, Attention                     Memory: Decreased recall of precautions Following Commands: Follows one step commands with  increased time   Awareness: Intellectual Problem Solving: Slow processing, Requires verbal cues, Requires tactile cues General Comments: pt is hyperfocused on her GI symptoms and not irritating anything by moving        Exercises General Exercises - Lower Extremity Ankle Circles/Pumps: AROM, AAROM, 5 reps Quad Sets: AROM, 10 reps Gluteal Sets: AROM, 10 reps Hip ABduction/ADduction: AROM, 10 reps    General Comments General comments (skin integrity, edema, etc.): pt is talking with nursing when PT arrives and receiving pain meds, declines OOB as she has been up many times for GI  issues.  Talked with her about strengthening ex and was able to do safe routine in bed with protection of spinal precautions      Pertinent Vitals/Pain Pain Assessment Pain Assessment: Faces Faces Pain Scale: Hurts little more Pain Location: back Pain Descriptors / Indicators: Guarding, Sore Pain Intervention(s): Limited activity within patient's tolerance, Monitored during session, Premedicated before session, Repositioned    Home Living                          Prior Function            PT Goals (current goals can now be found in the care plan section) Acute Rehab PT Goals Patient Stated Goal: reduce incidence of diarrhea and less pain Progress towards PT goals: Not progressing toward goals - comment    Frequency    Min 5X/week      PT Plan Current plan remains appropriate    Co-evaluation              AM-PAC PT "6 Clicks" Mobility   Outcome Measure  Help needed turning from your back to your side while in a flat bed without using bedrails?: A Little Help needed moving from lying on your back to sitting on the side of a flat bed without using bedrails?: A Little Help needed moving to and from a bed to a chair (including a wheelchair)?: A Little Help needed standing up from a chair using your arms (e.g., wheelchair or bedside chair)?: A Little Help needed to walk in hospital room?: A Little Help needed climbing 3-5 steps with a railing? : A Little 6 Click Score: 18    End of Session   Activity Tolerance: Patient tolerated treatment well Patient left: with call bell/phone within reach;in bed;with SCD's reapplied;with bed alarm set Nurse Communication: Mobility status PT Visit Diagnosis: Unsteadiness on feet (R26.81);Other abnormalities of gait and mobility (R26.89);Muscle weakness (generalized) (M62.81);Pain     Time: [id number] PT Time Calculation (min) (ACUTE ONLY): 32 min  Charges:  $Therapeutic Exercise: 23-37 mins               Ramond Dial 10/07/2022, 4:59 PM  Mee Hives, PT PhD Acute Rehab Dept. Number: [facility] [place]

## 2022-10-07 NOTE — Progress Notes (Signed)
Patient ID: Amanda [name], female   DOB: 13-Mar-1961, 62 y.o.   MRN: [id number] BP (!) 152/78 (BP Location: Left Arm)   Pulse 89   Temp 98.4 F (36.9 C) (Oral)   Resp 17   Ht 5' 7.99" (1.727 m)   Wt 80.7 kg   SpO2 100%   BMI 27.07 kg/m  Alert and oriented x 4, wound is dry No headache reported Moving better Diarrhea has stopped Much improved

## 2022-10-08 DIAGNOSIS — R7881 Bacteremia: Secondary | ICD-10-CM | POA: Diagnosis not present

## 2022-10-08 DIAGNOSIS — B9561 Methicillin susceptible Staphylococcus aureus infection as the cause of diseases classified elsewhere: Secondary | ICD-10-CM | POA: Diagnosis not present

## 2022-10-08 LAB — BASIC METABOLIC PANEL
Anion gap: 10 (ref 5–15)
BUN: 6 mg/dL — ABNORMAL LOW (ref 8–23)
CO2: 23 mmol/L (ref 22–32)
Calcium: 8.6 mg/dL — ABNORMAL LOW (ref 8.9–10.3)
Chloride: 100 mmol/L (ref 98–111)
Creatinine, Ser: 0.72 mg/dL (ref 0.44–1.00)
GFR, Estimated: >60 mL/min (ref >60)
Glucose, Bld: 112 mg/dL — ABNORMAL HIGH (ref 70–99)
Potassium: 3.4 mmol/L — ABNORMAL LOW (ref 3.5–5.1)
Sodium: 133 mmol/L — ABNORMAL LOW (ref 135–145)

## 2022-10-08 LAB — CBC
HCT: 29.7 % — ABNORMAL LOW (ref 36.0–46.0)
Hemoglobin: 9.6 g/dL — ABNORMAL LOW (ref 12.0–15.0)
MCH: 28.5 pg (ref 26.0–34.0)
MCHC: 32.3 g/dL (ref 30.0–36.0)
MCV: 88.1 fL (ref 80.0–100.0)
Platelets: 478 10*3/uL — ABNORMAL HIGH (ref 150–400)
RBC: 3.37 MIL/uL — ABNORMAL LOW (ref 3.87–5.11)
RDW: 15.9 % — ABNORMAL HIGH (ref 11.5–15.5)
WBC: 10.6 10*3/uL — ABNORMAL HIGH (ref 4.0–10.5)
nRBC: 0 % (ref 0.0–0.2)

## 2022-10-08 LAB — MAGNESIUM: Magnesium: 1.8 mg/dL (ref 1.7–2.4)

## 2022-10-08 MED ORDER — CEFAZOLIN IV (FOR PTA / DISCHARGE USE ONLY): 2.0000 g | Freq: Three times a day (TID) | INTRAVENOUS | 0 refills | Status: DC

## 2022-10-08 MED ORDER — MAGNESIUM SULFATE 2 GM/50ML IV SOLN
2.0000 g | Freq: Once | INTRAVENOUS | Status: AC
  Administered 2022-10-08: 2 g via INTRAVENOUS
  Filled 2022-10-08: qty 50

## 2022-10-08 MED ORDER — POTASSIUM CHLORIDE CRYS ER 20 MEQ PO TBCR
40.0000 meq | EXTENDED_RELEASE_TABLET | Freq: Once | ORAL | Status: AC
  Administered 2022-10-08: 40 meq via ORAL
  Filled 2022-10-08: qty 2

## 2022-10-08 MED ORDER — CHLORHEXIDINE GLUCONATE CLOTH 2 % EX PADS
6.0000 | MEDICATED_PAD | Freq: Every day | CUTANEOUS | Status: DC
  Administered 2022-10-08 – 2022-10-09 (×2): 6 via TOPICAL

## 2022-10-08 MED ORDER — NAFCILLIN IV (FOR PTA / DISCHARGE USE ONLY): INTRAVENOUS | 0 refills | Status: AC

## 2022-10-08 NOTE — Plan of Care (Signed)
Patient Amanda [name], VSS throughout shift. All meds given on time as ordered. Pt c/o pain relieved by PRN oxycodone. PRN trazodone given for sleep. Diminished lungs, IS encouraged. Pt standby assist to bedside commode. POC maintained, will continue to monitor.  Problem: Fluid Volume: Goal: Hemodynamic stability will improve Outcome: Progressing   Problem: Clinical Measurements: Goal: Diagnostic test results will improve Outcome: Progressing Goal: Signs and symptoms of infection will decrease Outcome: Progressing   Problem: Respiratory: Goal: Ability to maintain adequate ventilation will improve Outcome: Progressing   Problem: Education: Goal: Knowledge of General Education information will improve Description: Including pain rating scale, medication(s)/side effects and non-pharmacologic comfort measures Outcome: Progressing   Problem: Health Behavior/Discharge Planning: Goal: Ability to manage health-related needs will improve Outcome: Progressing   Problem: Clinical Measurements: Goal: Ability to maintain clinical measurements within normal limits will improve Outcome: Progressing Goal: Will remain free from infection Outcome: Progressing Goal: Diagnostic test results will improve Outcome: Progressing Goal: Respiratory complications will improve Outcome: Progressing Goal: Cardiovascular complication will be avoided Outcome: Progressing   Problem: Activity: Goal: Risk for activity intolerance will decrease Outcome: Progressing   Problem: Nutrition: Goal: Adequate nutrition will be maintained Outcome: Progressing   Problem: Coping: Goal: Level of anxiety will decrease Outcome: Progressing   Problem: Elimination: Goal: Will not experience complications related to bowel motility Outcome: Progressing Goal: Will not experience complications related to urinary retention Outcome: Progressing   Problem: Pain Managment: Goal: General experience of comfort will  improve Outcome: Progressing   Problem: Safety: Goal: Ability to remain free from injury will improve Outcome: Progressing   Problem: Skin Integrity: Goal: Risk for impaired skin integrity will decrease Outcome: Progressing   Problem: Education: Goal: Ability to verbalize activity precautions or restrictions will improve Outcome: Progressing Goal: Knowledge of the prescribed therapeutic regimen will improve Outcome: Progressing Goal: Understanding of discharge needs will improve Outcome: Progressing   Problem: Activity: Goal: Ability to avoid complications of mobility impairment will improve Outcome: Progressing Goal: Ability to tolerate increased activity will improve Outcome: Progressing Goal: Will remain free from falls Outcome: Progressing   Problem: Bowel/Gastric: Goal: Gastrointestinal status for postoperative course will improve Outcome: Progressing   Problem: Clinical Measurements: Goal: Ability to maintain clinical measurements within normal limits will improve Outcome: Progressing Goal: Postoperative complications will be avoided or minimized Outcome: Progressing Goal: Diagnostic test results will improve Outcome: Progressing   Problem: Pain Management: Goal: Pain level will decrease Outcome: Progressing   Problem: Skin Integrity: Goal: Will show signs of wound healing Outcome: Progressing   Problem: Health Behavior/Discharge Planning: Goal: Identification of resources available to assist in meeting health care needs will improve Outcome: Progressing   Problem: Bladder/Genitourinary: Goal: Urinary functional status for postoperative course will improve Outcome: Progressing

## 2022-10-08 NOTE — Progress Notes (Signed)
Physical Therapy Treatment Patient Details Name: Amanda [name] MRN: [id number] DOB: 05/30/1961 Today's Date: 10/08/2022   History of Present Illness 62 year old married female, brought to the ED on 09/26/2022 where she was noted to have a fever of 105 F. Pt s/p right L4-5 laminotomy and microdiscectomy at the outpatient surgical center on 09/14/2022, unremarkable postop course, developed acute onset of confusion, cramping in her right leg, restlessness and combativeness and   Admitted for severe sepsis, acute encephalopathy and acute kidney injury.  Dx MSSA bacteremia, suspected bacterial meningitis. Found to have subdural collection of fluid L5-T12. S/p cerebrospinal fluid leak repair, and posterior lumbar fusion Right L4-L5 3/9. HOB flat 48 hrs, post sx. PMH: Intractable migraine, hyperglycemia, RLS, depression.    PT Comments    Pt progressing well with mobility at this time, ambulating in hallway with use of RW. Pt only requiring physical assist for bed mobility. PT to continue to follow, pt hopeful to d/c home mover the weekend.      Recommendations for follow up therapy are one component of a multi-disciplinary discharge planning process, led by the attending physician.  Recommendations may be updated based on patient status, additional functional criteria and insurance authorization.  Follow Up Recommendations  Home health PT     Assistance Recommended at Discharge Frequent or constant Supervision/Assistance  Patient can return home with the following A little help with walking and/or transfers;A little help with bathing/dressing/bathroom;Assistance with cooking/housework;Direct supervision/assist for medications management;Direct supervision/assist for financial management;Assist for transportation;Help with stairs or ramp for entrance   Equipment Recommendations  Rolling walker (2 wheels);BSC/3in1    Recommendations for Other Services       Precautions / Restrictions  Precautions Precautions: Back Precaution Booklet Issued: No Precaution Comments: reviewed back precautions during functional tasks Restrictions Weight Bearing Restrictions: No     Mobility  Bed Mobility Overal bed mobility: Needs Assistance Bed Mobility: Sit to Supine       Sit to supine: Min assist   General bed mobility comments: assist for LE lift into bed.    Transfers Overall transfer level: Needs assistance Equipment used: Rolling walker (2 wheels) Transfers: Sit to/from Stand Sit to Stand: Supervision           General transfer comment: for safety, sit<>stand x2 from EOB and BSC.    Ambulation/Gait Ambulation/Gait assistance: Supervision Gait Distance (Feet): 200 Feet Assistive device: Rolling walker (2 wheels) Gait Pattern/deviations: Step-through pattern, Narrow base of support, Decreased stride length Gait velocity: decr     General Gait Details: cues for hallway navigation, increased time to perform   Stairs             Wheelchair Mobility    Modified Rankin (Stroke Patients Only)       Balance Overall balance assessment: Needs assistance Sitting-balance support: Feet supported, No upper extremity supported Sitting balance-Leahy Scale: Good     Standing balance support: During functional activity, Bilateral upper extremity supported Standing balance-Leahy Scale: Fair Standing balance comment: reliant on UE support for balance                            Cognition Arousal/Alertness: Awake/alert Behavior During Therapy: Anxious Overall Cognitive Status: Impaired/Different from baseline Area of Impairment: Problem solving, Awareness, Attention                   Current Attention Level: Selective Memory: Decreased recall of precautions Following Commands: Follows one step commands with increased  time Safety/Judgement: Decreased awareness of safety Awareness: Emergent Problem Solving: Requires verbal cues,  Requires tactile cues          Exercises      General Comments        Pertinent Vitals/Pain Pain Assessment Pain Assessment: Faces Faces Pain Scale: Hurts even more Pain Location: back Pain Descriptors / Indicators: Guarding, Sore Pain Intervention(s): Limited activity within patient's tolerance, Monitored during session, Repositioned    Home Living                          Prior Function            PT Goals (current goals can now be found in the care plan section) Acute Rehab PT Goals Patient Stated Goal: have less pain PT Goal Formulation: With patient Time For Goal Achievement: 10/19/22 Potential to Achieve Goals: Good Progress towards PT goals: Progressing toward goals    Frequency    Min 5X/week      PT Plan Current plan remains appropriate    Co-evaluation              AM-PAC PT "6 Clicks" Mobility   Outcome Measure  Help needed turning from your back to your side while in a flat bed without using bedrails?: A Little Help needed moving from lying on your back to sitting on the side of a flat bed without using bedrails?: A Little Help needed moving to and from a bed to a chair (including a wheelchair)?: A Little Help needed standing up from a chair using your arms (e.g., wheelchair or bedside chair)?: A Little Help needed to walk in hospital room?: A Little Help needed climbing 3-5 steps with a railing? : A Little 6 Click Score: 18    End of Session   Activity Tolerance: Patient tolerated treatment well Patient left: with call bell/phone within reach;in bed;with SCD's reapplied (all rails up per pt request, pt calls for assist when needed for mobility) Nurse Communication: Mobility status PT Visit Diagnosis: Unsteadiness on feet (R26.81);Other abnormalities of gait and mobility (R26.89);Muscle weakness (generalized) (M62.81);Pain     Time: [id number] PT Time Calculation (min) (ACUTE ONLY): 31 min  Charges:  $Gait Training: 8-22  mins $Therapeutic Activity: 8-22 mins                     Stacie Glaze, PT DPT Acute Rehabilitation Services Pager [phone]  Office [phone]    [place] 10/08/2022, 2:34 PM

## 2022-10-08 NOTE — TOC Transition Note (Signed)
Transition of Care [facility]) - CM/SW Discharge Note   Patient Details  Name: Amanda [name] MRN: [id number] Date of Birth: [birth year]  Transition of Care [facility]) CM/SW Contact:  Curlene Labrum, RN Phone Number: 10/08/2022, 11:43 AM   Clinical Narrative:    CM met with the patient at the bedside to discuss TOC needs for home.  The patient states that she asked the bedside RN to contact the Neurosurgeon Dr. Christella Noa to evaluate her drainage on her honeycomb back dressing.  The patient states that Neurosurgery plans to send her home on Sunday, 10/10/2022.  OPAT orders were signed by Dr. Cruzita Lederer and Carolynn Sayers, [place] notified for likely weekend discharge.  [place] with [employer] completed bedside teaching on 10/07/22 and will follow for home IV antibiotics delivery.  The patient is set up for home health services through [place] and orders were placed.  [employer] delivered RW and 3:1 to the hospital room today.  The patient plans to discharge home with her husband by car once cleared by Neurosurgery - pending over the weekend.   Final next level of care: Home w Home Health Services Barriers to Discharge: Continued Medical Work up   Patient Goals and CMS Choice CMS Medicare.gov Compare Post Acute Care list provided to:: Patient Choice offered to / list presented to : Patient  Discharge Placement                         Discharge Plan and Services Additional resources added to the After Visit Summary for     Discharge Planning Services: CM Consult Post Acute Care Choice: Home Health          DME Arranged: Gilford Rile rolling, 3-N-1 DME Agency: [employer] Date DME Agency Contacted: 10/06/22 Time DME Agency Contacted: 1232 Representative spoke with at DME Agency: [place]: RN, PT [place] Agency: [employer], [place] Date [place]: 10/06/22 Time [place]: 1237 Representative spoke with at [place]: Carolynn Sayers, RNCM with  Carlene Coria, RNCM with [facility]  Social Determinants of Health (SDOH) Interventions SDOH Screenings   Food Insecurity: No Food Insecurity (09/27/2022)  Housing: Low Risk  (09/27/2022)  Transportation Needs: No Transportation Needs (09/27/2022)  Utilities: Not At Risk (09/27/2022)  Tobacco Use: Low Risk  (10/05/2022)     Readmission Risk Interventions    10/06/2022   12:38 PM  Readmission Risk Prevention Plan  Post Dischage Appt Complete  Medication Screening Complete  Transportation Screening Complete

## 2022-10-08 NOTE — Progress Notes (Signed)
PROGRESS NOTE  Amanda [name] [id number] DOB: June 06, 1961 DOA: 09/26/2022 PCP: Carol Ada, MD   LOS: 12 days   Brief Narrative / Interim history: 62 year old married female, s/p right L4-5 laminotomy and microdiscectomy at the outpatient surgical center on 09/14/2022, unremarkable postop course, developed acute onset of confusion, cramping in her right leg, restlessness and combativeness and brought to the ED on 09/26/2022 where she was noted to have a fever of 105 F.  She was admitted for severe sepsis, acute encephalopathy and acute kidney injury.  Since then she is diagnosed with MSSA bacteremia, suspected bacterial meningitis.  ID and neurosurgery consulted and following.  Ongoing severe headaches, improving.  TEE negative for vegetations.  3/9: CSF leak repair and L4-5 PLIF.  Improving  Subjective / 24h Interval events: She is concerned about drainage from her surgical site.  Diarrhea has resolved.  Assesement and Plan: Principal Problem:   MSSA bacteremia Active Problems:   Restless legs syndrome (RLS)   Other migraine without status migrainosus, not intractable   Low back pain   Severe sepsis (HCC)   Acute kidney injury [facility])   Surgical site infection   Sepsis with encephalopathy without septic shock (HCC)   Meningitis, staphylococcal   Other spinal cerebrospinal fluid leak   Principal problem Severe sepsis, secondary to MSSA bacteremia in the setting of recent L4-5 microdiscectomy (09/14/2022) with suspected bacterial meningitis, all POA - Surveillance blood cultures from 3/5: Negative.  Sepsis physiology resolved.  ID signed off 3/11.    Active problems  Diarrhea -she has no abdominal pain, no nausea, GI pathogen panel was negative.  White count better, she remains without abdominal cramping or pain, responded well to Lomotil.  Continue  MSSA bacteremia - ID follow-up appreciated.  Surgical wound infection as a potential source for bacteremia.  Blood cultures from  09/26/2022 positive for MSSA.  Surveillance blood cultures from 09/28/2022: Negative.  MRI L-spine 3/5 showed subdural fluid collection from L5 through at least T12.  Continue IV nafcillin infusion of 12 g daily through 10/15/2022, followed by cefazolin 2 g IV every 8 hours from 3/23 through 11/12/2022 (as per ID signoff recommendations 3/11).  Has OPAT orders by ID.  TTE showed normal EF, no evidence of vegetations.  TEE negative for vegetations. As per neurosurgery follow-up, wound was draining again.  Taken to the OR on 3/9 and underwent CSF leak repair and L4-5 PLIF.    Suspected MSSA bacterial meningitis - Difficult and traumatic lumbar puncture.  CSF growing MSSA.  IV antibiotics management as noted above.  Instead of a single-lumen PICC, the PICC team placed a double-lumen PICC line 3/8.  ID signed off on 3/11 and has follow-up with Dr. Tommy Medal in [place] [facility] on 10/27/2022 at 2:45 PM.   Septic encephalopathy - Secondary to above.  Resolved.   Severe headache - Multifactorial: Acute bacterial meningitis and CSF leak.  MRI brain with and without contrast 3/6: Mild diffuse smooth dural thickening, which is nonspecific but can be seen in the setting of bacterial meningitis.  No additional acute intracranial process.  On multimodality pain control.  Headaches now appear much improved.   Leg cramps - Unclear etiology.  Again today, she was quite hypokalemic yesterday and still this morning.  Replete magnesium as well   Acute kidney injury - Creatinine had peaked to 1.4.  Resolved.  Renal function has remained stable   Normocytic anemia - Reportedly chronic.  Follow CBC periodically.  Hemoglobin stable today   Hypokalemia-continue to replace  today, better than yesterday   Depression, migraine, RLS - Continue home dose of gabapentin (reportedly takes 900 mg at bedtime).  And Effexor XR.  Started Relpax.   Isolated indirect hyperbilirubinemia - Unclear etiology. Stable  Scheduled Meds:  acetaminophen   1,000 mg Oral Q8H   Chlorhexidine Gluconate Cloth  6 each Topical Daily   Chlorhexidine Gluconate Cloth  6 each Topical Q0600   diphenoxylate-atropine  1 tablet Oral BID   gabapentin  900 mg Oral QHS   oxyCODONE  20 mg Oral Q12H   pantoprazole  40 mg Oral Daily   saccharomyces boulardii  250 mg Oral BID   sodium chloride flush  10-40 mL Intracatheter Q12H   sodium chloride flush  3 mL Intravenous Q12H   venlafaxine XR  225 mg Oral QHS   Continuous Infusions:  sodium chloride     nafcillin 12 g in sodium chloride 0.9 % 500 mL continuous infusion 12 g (10/08/22 1127)   promethazine (PHENERGAN) injection (IM or IVPB) Stopped (10/02/22 2331)   PRN Meds:.diazepam, eletriptan, HYDROmorphone (DILAUDID) injection, loperamide, menthol-cetylpyridinium **OR** phenol, ondansetron (ZOFRAN) IV, mouth rinse, oxyCODONE, oxyCODONE, polyethylene glycol, promethazine (PHENERGAN) injection (IM or IVPB), sodium chloride flush, sodium chloride flush, traZODone, witch hazel-glycerin  Current Outpatient Medications  Medication Instructions   aspirin-acetaminophen-caffeine (EXCEDRIN MIGRAINE) 250-250-65 MG tablet 2 tablets, Oral, Every 6 hours PRN   Calcium Carbonate-Vit D-Min (CALCIUM 1200 PO) 1,200 mg, Oral, 2 times daily   [START ON 10/16/2022] ceFAZolin (ANCEF) IVPB 2 g, Intravenous, Every 8 hours, Indication:  MSSA bacteremia / menigitis<BR>First Dose: Yes<BR>Last Day of Therapy:  11/12/2022<BR>Labs - Once weekly:  CBC/D and BMP,<BR>Labs - Every other week:  ESR and CRP<BR>Method of administration: IV Push<BR>Method of administration may be changed at the discretion of home infusion pharmacist based upon assessment of the patient and/or caregiver's ability to self-administer the medication ordered.   cholecalciferol (VITAMIN D3) 1,000 Units, Oral, Daily   clonazePAM (KLONOPIN) 1 MG tablet TAKE ONE TABLET BY MOUTH TWICE A DAY MAY TAKE ADDITIONAL TABLET IF NEEDED   eletriptan (RELPAX) 40 MG tablet TAKE ONE TABLET  BY MOUTH AT ONSET OF HEADACHE; MAY REPEAT ONE TABLET IN 2 HOURS IF NEEDED.   esomeprazole (NEXIUM) 20 mg, Oral, Daily   gabapentin (NEURONTIN) 300 MG capsule TAKE THREE CAPSULES BY MOUTH EVERY NIGHT AT BEDTIME   HYDROcodone-acetaminophen (NORCO) 7.5-325 MG tablet 1 tablet, Oral, 4 times daily PRN   Linzess 290 mcg, Oral, Daily   Magnesium 400 mg, Oral, 2 times daily   nafcillin IVPB Infuse 12g of nafcillin continuously as an IV infusion<BR>Indication:  MSSA bacteremia / meningitis<BR>First Dose: No<BR>Last Day of Therapy:  10/15/2022<BR>Labs - Once weekly:  CBC/D and BMP,<BR>Labs - Every other week:  ESR and CRP<BR>Method of administration: Elastomeric (Continuous infusion)<BR>Method of administration may be changed at the discretion of home infusion pharmacist based upon assessment of the patient and/or caregiver's ability to self-administer the medication ordered.   ondansetron (ZOFRAN) 8 MG tablet TAKE 1 TABLET BY MOUTH EVERY 8 HOURS AS NEEDED FOR NAUSEA AND VOMITING   promethazine (PHENERGAN) 12.5 mg, Oral, Every 6 hours PRN   venlafaxine XR (EFFEXOR-XR) 150 MG 24 hr capsule TAKE ONE CAPSULE BY MOUTH DAILY WITH BREAKFAST, Take with the 75 mg = 225 mg daily.   venlafaxine XR (EFFEXOR-XR) 75 MG 24 hr capsule TAKE ONE CAPSULE BY MOUTH EVERY MORNING BEFORE BREAKFAST    Diet Orders (From admission, onward)     Start     Ordered   10/02/22  1754  Diet regular Room service appropriate? Yes with Assist; Fluid consistency: Thin  Diet effective now       Question Answer Comment  Room service appropriate? Yes with Assist   Fluid consistency: Thin      10/02/22 1755            DVT prophylaxis: SCD's Start: 10/02/22 1752 SCDs Start: 09/26/22 1259   Lab Results  Component Value Date   PLT 478 (H) 10/08/2022      Code Status: DNR  Family Communication: no family at bedside  Status is: Inpatient  Remains inpatient appropriate because: severity of illness  Level of care:  Med-Surg  Consultants:  Neurosurgery ID  Objective: Vitals:   10/07/22 1551 10/07/22 1940 10/08/22 0356 10/08/22 0802  BP: 134/81 (!) 152/78 (!) 142/71 137/74  Pulse: (!) 101 89 81 86  Resp: 17 17 17 18   Temp: 98.3 F (36.8 C) 98.4 F (36.9 C) 98.3 F (36.8 C) 98 F (36.7 C)  TempSrc: Oral Oral  Oral  SpO2: 100% 100% 98% 94%  Weight:      Height:        Intake/Output Summary (Last 24 hours) at 10/08/2022 1230 Last data filed at 10/08/2022 0421 Gross per 24 hour  Intake 511.13 ml  Output 1200 ml  Net -688.87 ml    Wt Readings from Last 3 Encounters:  10/02/22 80.7 kg  12/27/19 72.1 kg  07/02/19 67.7 kg    Examination:  Constitutional: NAD Eyes: lids and conjunctivae normal, no scleral icterus ENMT: mmm Neck: normal, supple Respiratory: clear to auscultation bilaterally, no wheezing, no crackles. Normal respiratory effort.  Cardiovascular: Regular rate and rhythm, no murmurs / rubs / gallops.  Abdomen: soft, no distention, no tenderness. Bowel sounds positive.  Skin: no rashes Neurologic: no focal deficits, equal strength  Data Reviewed: I have independently reviewed following labs and imaging studies   CBC Recent Labs  Lab 10/04/22 0355 10/05/22 0455 10/06/22 0440 10/07/22 0317 10/08/22 0417  WBC 9.6 11.0* 13.9* 14.5* 10.6*  HGB 9.7* 9.7* 10.1* 10.0* 9.6*  HCT 29.0* 29.5* 30.3* 30.3* 29.7*  PLT 368 391 401* 491* 478*  MCV 85.8 87.5 85.8 87.3 88.1  MCH 28.7 28.8 28.6 28.8 28.5  MCHC 33.4 32.9 33.3 33.0 32.3  RDW 15.0 15.2 15.5 15.8* 15.9*     Recent Labs  Lab 10/02/22 0134 10/03/22 0130 10/04/22 0355 10/05/22 0455 10/06/22 0440 10/07/22 0317 10/08/22 0417  NA 132* 137 139 134* 134* 136 133*  K 3.6 4.1 3.0* 3.8 3.4* 2.9* 3.4*  CL 99 104 109 99 96* 97* 100  CO2 22 20* 18* 26 23 25 23   GLUCOSE 91 84 80 95 89 91 112*  BUN <5* 6* <5* <5* <5* 5* 6*  CREATININE 0.72 0.87 0.60 0.69 0.74 0.77 0.72  CALCIUM 8.4* 8.4* 6.9* 8.7* 8.6* 8.8* 8.6*   AST 36 29 17  --  16 16  --   ALT 29 26 16   --  15 11  --   ALKPHOS 156* 147* 108  --  130* 126  --   BILITOT 1.6* 1.7* 1.2  --  1.5* 1.7*  --   ALBUMIN 2.5* 2.4* 1.7*  --  2.2* 2.3*  --   MG  --  2.0  --  1.8  --  1.9 1.8     ------------------------------------------------------------------------------------------------------------------ No results for input(s): "CHOL", "HDL", "LDLCALC", "TRIG", "CHOLHDL", "LDLDIRECT" in the last 72 hours.  Lab Results  Component Value Date  HGBA1C 5.1 04/19/2016   ------------------------------------------------------------------------------------------------------------------ No results for input(s): "TSH", "T4TOTAL", "T3FREE", "THYROIDAB" in the last 72 hours.  Invalid input(s): "FREET3"  Cardiac Enzymes No results for input(s): "CKMB", "TROPONINI", "MYOGLOBIN" in the last 168 hours.  Invalid input(s): "CK" ------------------------------------------------------------------------------------------------------------------ No results found for: "BNP"  CBG: Recent Labs  Lab 10/07/22 0149  GLUCAP 104*     Recent Results (from the past 240 hour(s))  Surgical pcr screen     Status: None   Collection Time: 10/02/22 12:00 PM   Specimen: Nasal Mucosa; Nasal Swab  Result Value Ref Range Status   MRSA, PCR NEGATIVE NEGATIVE Final   Staphylococcus aureus NEGATIVE NEGATIVE Final    Comment: (NOTE) The Xpert SA Assay (FDA approved for NASAL specimens in patients 13 years of age and older), is one component of a comprehensive surveillance program. It is not intended to diagnose infection nor to guide or monitor treatment. Performed at [facility], [place] [address], [place], [place] [address]   Gastrointestinal Panel by PCR , Stool     Status: None   Collection Time: 10/05/22  1:02 AM   Specimen: Stool  Result Value Ref Range Status   Campylobacter species NOT DETECTED NOT DETECTED Final   Plesimonas shigelloides NOT DETECTED NOT  DETECTED Final   Salmonella species NOT DETECTED NOT DETECTED Final   Yersinia enterocolitica NOT DETECTED NOT DETECTED Final   Vibrio species NOT DETECTED NOT DETECTED Final   Vibrio cholerae NOT DETECTED NOT DETECTED Final   Enteroaggregative E coli (EAEC) NOT DETECTED NOT DETECTED Final   Enteropathogenic E coli (EPEC) NOT DETECTED NOT DETECTED Final   Enterotoxigenic E coli (ETEC) NOT DETECTED NOT DETECTED Final   Shiga like toxin producing E coli (STEC) NOT DETECTED NOT DETECTED Final   Shigella/Enteroinvasive E coli (EIEC) NOT DETECTED NOT DETECTED Final   Cryptosporidium NOT DETECTED NOT DETECTED Final   Cyclospora cayetanensis NOT DETECTED NOT DETECTED Final   Entamoeba histolytica NOT DETECTED NOT DETECTED Final   Giardia lamblia NOT DETECTED NOT DETECTED Final   Adenovirus F40/41 NOT DETECTED NOT DETECTED Final   Astrovirus NOT DETECTED NOT DETECTED Final   Norovirus GI/GII NOT DETECTED NOT DETECTED Final   Rotavirus A NOT DETECTED NOT DETECTED Final   Sapovirus (I, II, IV, and V) NOT DETECTED NOT DETECTED Final    Comment: Performed at [facility], [address] [place], [place] [address]     Radiology Studies: No results found.   Marzetta Board, MD, PhD [facility]  Between 7 am - 7 pm I am available, please contact me via Amion (for emergencies) or Securechat (non urgent messages)  Between 7 pm - 7 am I am not available, please contact night coverage MD/APP via Amion

## 2022-10-08 NOTE — Progress Notes (Signed)
Patient ID: Amanda [name], female   DOB: Mar 12, 1961, 62 y.o.   MRN: [id number] BP (!) 140/71 (BP Location: Left Arm)   Pulse 83   Temp 98.3 F (36.8 C) (Oral)   Resp 18   Ht 5' 7.99" (1.727 m)   Wt 80.7 kg   SpO2 98%   BMI 27.07 kg/m  Patient having no problems from operation and is not remaining in the hospital for any surgical reason.  She has had persistent diarrhea that preceded surgery and continues to this day.  From my standpoint she can be discharged, however the loose stools remain problematic.no evidence of csf leak, no postural headaches. Wound is clean and dry.

## 2022-10-08 NOTE — Plan of Care (Signed)
  Problem: Fluid Volume: Goal: Hemodynamic stability will improve Outcome: Progressing   Problem: Clinical Measurements: Goal: Diagnostic test results will improve Outcome: Progressing Goal: Signs and symptoms of infection will decrease Outcome: Progressing   Problem: Respiratory: Goal: Ability to maintain adequate ventilation will improve Outcome: Progressing   Problem: Education: Goal: Knowledge of General Education information will improve Description: Including pain rating scale, medication(s)/side effects and non-pharmacologic comfort measures Outcome: Progressing   Problem: Health Behavior/Discharge Planning: Goal: Ability to manage health-related needs will improve Outcome: Progressing   Problem: Clinical Measurements: Goal: Ability to maintain clinical measurements within normal limits will improve Outcome: Progressing Goal: Will remain free from infection Outcome: Progressing Goal: Diagnostic test results will improve Outcome: Progressing Goal: Respiratory complications will improve Outcome: Progressing Goal: Cardiovascular complication will be avoided Outcome: Progressing   Problem: Activity: Goal: Risk for activity intolerance will decrease Outcome: Progressing   Problem: Nutrition: Goal: Adequate nutrition will be maintained Outcome: Progressing   Problem: Coping: Goal: Level of anxiety will decrease Outcome: Progressing   Problem: Elimination: Goal: Will not experience complications related to bowel motility Outcome: Progressing Goal: Will not experience complications related to urinary retention Outcome: Progressing   Problem: Pain Managment: Goal: General experience of comfort will improve Outcome: Progressing   Problem: Safety: Goal: Ability to remain free from injury will improve Outcome: Progressing   Problem: Skin Integrity: Goal: Risk for impaired skin integrity will decrease Outcome: Progressing   Problem: Education: Goal: Ability  to verbalize activity precautions or restrictions will improve Outcome: Progressing Goal: Knowledge of the prescribed therapeutic regimen will improve Outcome: Progressing Goal: Understanding of discharge needs will improve Outcome: Progressing   Problem: Activity: Goal: Ability to avoid complications of mobility impairment will improve Outcome: Progressing Goal: Ability to tolerate increased activity will improve Outcome: Progressing Goal: Will remain free from falls Outcome: Progressing   Problem: Bowel/Gastric: Goal: Gastrointestinal status for postoperative course will improve Outcome: Progressing   Problem: Clinical Measurements: Goal: Ability to maintain clinical measurements within normal limits will improve Outcome: Progressing Goal: Postoperative complications will be avoided or minimized Outcome: Progressing Goal: Diagnostic test results will improve Outcome: Progressing   Problem: Pain Management: Goal: Pain level will decrease Outcome: Progressing   Problem: Skin Integrity: Goal: Will show signs of wound healing Outcome: Progressing   Problem: Health Behavior/Discharge Planning: Goal: Identification of resources available to assist in meeting health care needs will improve Outcome: Progressing   Problem: Bladder/Genitourinary: Goal: Urinary functional status for postoperative course will improve Outcome: Progressing

## 2022-10-09 DIAGNOSIS — R7881 Bacteremia: Secondary | ICD-10-CM | POA: Diagnosis not present

## 2022-10-09 DIAGNOSIS — B9561 Methicillin susceptible Staphylococcus aureus infection as the cause of diseases classified elsewhere: Secondary | ICD-10-CM | POA: Diagnosis not present

## 2022-10-09 LAB — CBC
HCT: 30.3 % — ABNORMAL LOW (ref 36.0–46.0)
Hemoglobin: 9.4 g/dL — ABNORMAL LOW (ref 12.0–15.0)
MCH: 28.1 pg (ref 26.0–34.0)
MCHC: 31 g/dL (ref 30.0–36.0)
MCV: 90.4 fL (ref 80.0–100.0)
Platelets: 529 10*3/uL — ABNORMAL HIGH (ref 150–400)
RBC: 3.35 MIL/uL — ABNORMAL LOW (ref 3.87–5.11)
RDW: 16.5 % — ABNORMAL HIGH (ref 11.5–15.5)
WBC: 10.1 10*3/uL (ref 4.0–10.5)
nRBC: 0 % (ref 0.0–0.2)

## 2022-10-09 LAB — MAGNESIUM: Magnesium: 2.1 mg/dL (ref 1.7–2.4)

## 2022-10-09 LAB — BASIC METABOLIC PANEL
Anion gap: 11 (ref 5–15)
BUN: 6 mg/dL — ABNORMAL LOW (ref 8–23)
CO2: 25 mmol/L (ref 22–32)
Calcium: 8.7 mg/dL — ABNORMAL LOW (ref 8.9–10.3)
Chloride: 101 mmol/L (ref 98–111)
Creatinine, Ser: 0.66 mg/dL (ref 0.44–1.00)
GFR, Estimated: >60 mL/min (ref >60)
Glucose, Bld: 103 mg/dL — ABNORMAL HIGH (ref 70–99)
Potassium: 3.4 mmol/L — ABNORMAL LOW (ref 3.5–5.1)
Sodium: 137 mmol/L (ref 135–145)

## 2022-10-09 MED ORDER — POTASSIUM CHLORIDE CRYS ER 20 MEQ PO TBCR
40.0000 meq | EXTENDED_RELEASE_TABLET | Freq: Once | ORAL | Status: AC
  Administered 2022-10-09: 40 meq via ORAL
  Filled 2022-10-09: qty 2

## 2022-10-09 MED ORDER — OXYCODONE HCL 10 MG PO TABS: 10.0000 mg | ORAL_TABLET | ORAL | 0 refills | Status: DC | PRN

## 2022-10-09 MED ORDER — DIPHENOXYLATE-ATROPINE 2.5-0.025 MG PO TABS: 1.0000 | ORAL_TABLET | Freq: Two times a day (BID) | ORAL | 0 refills | Status: DC

## 2022-10-09 MED ORDER — SACCHAROMYCES BOULARDII 250 MG PO CAPS: 250.0000 mg | ORAL_CAPSULE | Freq: Two times a day (BID) | ORAL | 0 refills | Status: DC

## 2022-10-09 MED ORDER — OXYCODONE HCL ER 20 MG PO T12A: 20.0000 mg | EXTENDED_RELEASE_TABLET | Freq: Two times a day (BID) | ORAL | 0 refills | Status: AC

## 2022-10-09 MED ORDER — POTASSIUM CHLORIDE CRYS ER 20 MEQ PO TBCR: 20.0000 meq | EXTENDED_RELEASE_TABLET | Freq: Every day | ORAL | 0 refills | Status: DC | PRN

## 2022-10-09 NOTE — TOC Transition Note (Addendum)
Transition of Care [facility]) - CM/SW Discharge Note   Patient Details  Name: LULIA FARR MRN: [id number] Date of Birth: 1961/01/20  Transition [facility]) CM/SW Contact:  Carles Collet, RN Phone Number: 10/09/2022, 8:34 AM   Clinical Narrative:     Home Health  Notified [place] providers, Alvis Lemmings will have Blaine Rn start tomorrow, and Pam with [facility] states meds will be delivered to the home by 2pm.  DME RW and 3/1 was requested to be delivered to the room yesterday.  Transportation- Spouse, Plan for DC around noon. Bedside Nurse updated.    Final next level of care: Home w Home Health Services Barriers to Discharge: No Barriers Identified   Patient Goals and CMS Choice CMS Medicare.gov Compare Post Acute Care list provided to:: Patient Choice offered to / list presented to : Patient  Discharge Placement                         Discharge Plan and Services Additional resources added to the After Visit Summary for     Discharge Planning Services: CM Consult Post Acute Care Choice: Home Health          DME Arranged: Gilford Rile rolling, 3-N-1 DME Agency: [facility] Date DME Agency Contacted: 10/06/22 Time DME Agency Contacted: 1232 Representative spoke with at DME Agency: [place]: RN, PT [place] Agency: [facility], [place] Date [place]: 10/09/22 Time [address]: [phone] Representative spoke with at [place] and Jeannene Patella  Social Determinants of Health ([place]) Interventions SDOH Screenings   Food Insecurity: No Food Insecurity (09/27/2022)  Housing: Low Risk  (09/27/2022)  Transportation Needs: No Transportation Needs (09/27/2022)  Utilities: Not At Risk (09/27/2022)  Tobacco Use: Low Risk  (10/05/2022)     Readmission Risk Interventions    10/06/2022   12:38 PM  Readmission Risk Prevention Plan  Post Dischage Appt Complete  Medication Screening Complete  Transportation Screening Complete

## 2022-10-09 NOTE — Progress Notes (Signed)
Pt has not had any bowel movements overnight.  She did report 4 stools during the day. Ayesha Mohair BSN RN [place] 10/09/2022, 7:28 AM

## 2022-10-09 NOTE — Discharge Instructions (Signed)
Follow with Amanda Ada, MD in 5-7 days  Please get a complete blood count and chemistry panel checked by your Primary MD at your next visit, and again as instructed by your Primary MD. Please get your medications reviewed and adjusted by your Primary MD.  Please request your Primary MD to go over all Hospital Tests and Procedure/Radiological results at the follow up, please get all Hospital records sent to your Prim MD by signing hospital release before you go home.  In some cases, there will be blood work, cultures and biopsy results pending at the time of your discharge. Please request that your primary care M.D. goes through all the records of your hospital data and follows up on these results.  If you had Pneumonia of Lung problems at the Hospital: Please get a 2 view Chest X ray done in 6-8 weeks after hospital discharge or sooner if instructed by your Primary MD.  If you have Congestive Heart Failure: Please call your Cardiologist or Primary MD anytime you have any of the following symptoms:  1) 3 pound weight gain in 24 hours or 5 pounds in 1 week  2) shortness of breath, with or without a dry hacking cough  3) swelling in the hands, feet or stomach  4) if you have to sleep on extra pillows at night in order to breathe  Follow cardiac low salt diet and 1.5 lit/day fluid restriction.  If you have diabetes Accuchecks 4 times/day, Once in AM empty stomach and then before each meal. Log in all results and show them to your primary doctor at your next visit. If any glucose reading is under 80 or above 300 call your primary MD immediately.  If you have Seizure/Convulsions/Epilepsy: Please do not drive, operate heavy machinery, participate in activities at heights or participate in high speed sports until you have seen by Primary MD or a Neurologist and advised to do so again. Per [facility] statutes, patients with seizures are not allowed to drive until they have been  seizure-free for six months.  Use caution when using heavy equipment or power tools. Avoid working on ladders or at heights. Take showers instead of baths. Ensure the water temperature is not too high on the home water heater. Do not go swimming alone. Do not lock yourself in a room alone (i.e. bathroom). When caring for infants or small children, sit down when holding, feeding, or changing them to minimize risk of injury to the child in the event you have a seizure. Maintain good sleep hygiene. Avoid alcohol.   If you had Gastrointestinal Bleeding: Please ask your Primary MD to check a complete blood count within one week of discharge or at your next visit. Your endoscopic/colonoscopic biopsies that are pending at the time of discharge, will also need to followed by your Primary MD.  Get Medicines reviewed and adjusted. Please take all your medications with you for your next visit with your Primary MD  Please request your Primary MD to go over all hospital tests and procedure/radiological results at the follow up, please ask your Primary MD to get all Hospital records sent to his/her office.  If you experience worsening of your admission symptoms, develop shortness of breath, life threatening emergency, suicidal or homicidal thoughts you must seek medical attention immediately by calling 911 or calling your MD immediately  if symptoms less severe.  You must read complete instructions/literature along with all the possible adverse reactions/side effects for all the Medicines you take  and that have been prescribed to you. Take any new Medicines after you have completely understood and accpet all the possible adverse reactions/side effects.   Do not drive or operate heavy machinery when taking Pain medications.   Do not take more than prescribed Pain, Sleep and Anxiety Medications  Special Instructions: If you have smoked or chewed Tobacco  in the last 2 yrs please stop smoking, stop any regular  Alcohol  and or any Recreational drug use.  Wear Seat belts while driving.  Please note You were cared for by a hospitalist during your hospital stay. If you have any questions about your discharge medications or the care you received while you were in the hospital after you are discharged, you can call the unit and asked to speak with the hospitalist on call if the hospitalist that took care of you is not available. Once you are discharged, your primary care physician will handle any further medical issues. Please note that NO REFILLS for any discharge medications will be authorized once you are discharged, as it is imperative that you return to your primary care physician (or establish a relationship with a primary care physician if you do not have one) for your aftercare needs so that they can reassess your need for medications and monitor your lab values.  You can reach the hospitalist office at phone [phone] or fax [phone]   If you do not have a primary care physician, you can call [phone] for a physician referral.  Activity: As tolerated with Full fall precautions use walker/cane & assistance as needed    Diet: regular  Disposition Home

## 2022-10-09 NOTE — Discharge Summary (Signed)
Physician Discharge Summary  Amanda [name] [id number] DOB: 1961-04-09 DOA: 09/26/2022  PCP: Carol Ada, MD  Admit date: 09/26/2022 Discharge date: 10/09/2022  Admitted From: home Disposition:  home  Recommendations for Outpatient Follow-up:  Follow up with PCP in 1-2 weeks Please obtain BMP/CBC in one week  Home Health: PT, RN Equipment/Devices: none  Discharge Condition: stable CODE STATUS: Full code Diet Orders (From admission, onward)     Start     Ordered   10/02/22 1754  Diet regular Room service appropriate? Yes with Assist; Fluid consistency: Thin  Diet effective now       Question Answer Comment  Room service appropriate? Yes with Assist   Fluid consistency: Thin      10/02/22 1755            HPI: Per admitting MD, Amanda [name] is a 62 y.o. female with medical history significant of RLS, migraines, low back pain, depression presenting with altered mental status and restlessness. History obtained with assistance of chart review and family.  Patient is less confused than when she came in.  She had a lumbar spine surgery 2 weeks ago electively and has had unremarkable recovery up until the past 12 hours.  She has become restless and agitated and disoriented with at times and inability to follow commands and or participate in conversation.  Her mentation has been waxing and waning with periods of being improved, but has largely been disoriented and agitated. Does report increased back pain for the past 3 to 4 days.  And she states to me that she does have some neck stiffness at this time which she did not have earlier this morning as well as a developing headache (but she has a significant history of headaches). No recorded fevers at home, but did not check.  Denies chest pain, shortness of breath, abdominal pain, constipation, diarrhea, nausea, vomiting.  Hospital Course / Discharge diagnoses: Principal Problem:   MSSA bacteremia Active Problems:    Restless legs syndrome (RLS)   Other migraine without status migrainosus, not intractable   Low back pain   Severe sepsis (HCC)   Acute kidney injury ([place])   Surgical site infection   Sepsis with encephalopathy without septic shock ([place])   Meningitis, staphylococcal   Other spinal cerebrospinal fluid leak   Principal problem Severe sepsis, secondary to MSSA bacteremia in the setting of recent L4-5 microdiscectomy (09/14/2022) with suspected bacterial meningitis, all POA - Surveillance blood cultures from 3/5: Negative.  Sepsis physiology resolved.  ID signed off 3/11.     Active problems  MSSA bacteremia - ID follow-up appreciated.  Surgical wound infection as a potential source for bacteremia.  Blood cultures from 09/26/2022 positive for MSSA.  Surveillance blood cultures from 09/28/2022: Negative.  MRI L-spine 3/5 showed subdural fluid collection from L5 through at least T12.  Continue IV nafcillin infusion of 12 g daily through 10/15/2022, followed by cefazolin 2 g IV every 8 hours from 3/23 through 11/12/2022 (as per ID signoff recommendations 3/11).  Has OPAT orders by ID.  TTE showed normal EF, no evidence of vegetations.  TEE negative for vegetations. As per neurosurgery follow-up, wound was draining again.  Taken to the OR on 3/9 and underwent CSF leak repair and L4-5 PLIF.  She is now stable from the standpoint Diarrhea -shortly after initiation of antibiotics patient has developed diarrhea.  GI pathogen panel was negative.  C. difficile is less likely without abdominal pain and normal WBC.  She responded  well to Lomotil, continue upon discharge. Suspected MSSA bacterial meningitis - Difficult and traumatic lumbar puncture.  CSF growing MSSA.  IV antibiotics management as noted above.  Instead of a single-lumen PICC, the PICC team placed a double-lumen PICC line 3/8.  ID signed off on 3/11 and has follow-up with Dr. Tommy Medal in [place] clinic on 10/27/2022 at 2:45 PM. Septic encephalopathy - Secondary  to above.  Resolved. Severe headache - Multifactorial: Acute bacterial meningitis and CSF leak.  MRI brain with and without contrast 3/6: Mild diffuse smooth dural thickening, which is nonspecific but can be seen in the setting of bacterial meningitis.  No additional acute intracranial process.  On multimodality pain control.  Headaches now appear much improved. Leg cramps - Unclear etiology but usually associated with low potassium  Acute kidney injury - Creatinine had peaked to 1.4.  Resolved.  Renal function has remained stable Normocytic anemia - Reportedly chronic.  Follow CBC periodically.  Hemoglobin stable Hypokalemia-need to replace especially in the days that she has diarrhea.  Short course prescribed upon discharge Depression, migraine, RLS - Continue home medications  Isolated indirect hyperbilirubinemia - Unclear etiology. Stable   Discharge Instructions  Discharge Instructions     [facility] pharmacist to adjust dose for Vancomycin, Aminoglycosides and other anti-infective therapies as requested by physician.   Complete by: As directed    [facility] to provide Cath Flo 2mg    Complete by: As directed    Administer for PICC line occlusion and as ordered by physician for other access device issues.   Anaphylaxis Kit: Provided to treat any anaphylactic reaction to the medication being provided to the patient if First Dose or when requested by physician   Complete by: As directed    Epinephrine 1mg /ml vial / amp: Administer 0.3mg  (0.34ml) subcutaneously once for moderate to severe anaphylaxis, nurse to call physician and pharmacy when reaction occurs and call 911 if needed for immediate care   Diphenhydramine 50mg /ml IV vial: Administer 25-50mg  IV/IM PRN for first dose reaction, rash, itching, mild reaction, nurse to call physician and pharmacy when reaction occurs   Sodium Chloride 0.9% NS 51ml IV: Administer if needed for hypovolemic blood pressure drop or as  ordered by physician after call to physician with anaphylactic reaction   Change dressing on IV access line weekly and PRN   Complete by: As directed    Flush IV access with Sodium Chloride 0.9% and Heparin 10 units/ml or 100 units/ml   Complete by: As directed    Home infusion instructions - [facility]   Complete by: As directed    Instructions: Flush IV access with Sodium Chloride 0.9% and Heparin 10units/ml or 100units/ml   Change dressing on IV access line: Weekly and PRN   Instructions Cath Flo 2mg : Administer for PICC Line occlusion and as ordered by physician for other access device   [facility] pharmacist to adjust dose for: Vancomycin, Aminoglycosides and other anti-infective therapies as requested by physician   Method of administration may be changed at the discretion of home infusion pharmacist based upon assessment of the patient and/or caregiver's ability to self-administer the medication ordered   Complete by: As directed       Allergies as of 10/09/2022       Reactions   Wellbutrin [bupropion]    GERD   Aspirin Other (See Comments)   Makes her ears ring        Medication List     STOP  taking these medications    HYDROcodone-acetaminophen 7.5-325 MG tablet Commonly known as: NORCO       TAKE these medications    aspirin-acetaminophen-caffeine 250-250-65 MG tablet Commonly known as: EXCEDRIN MIGRAINE Take 2 tablets by mouth every 6 (six) hours as needed for headache or migraine.   CALCIUM 1200 PO Take 1,200 mg by mouth 2 (two) times daily.   ceFAZolin  IVPB Commonly known as: ANCEF Inject 2 g into the vein every 8 (eight) hours for 27 days. Indication:  MSSA bacteremia / menigitis First Dose: Yes Last Day of Therapy:  11/12/2022 Labs - Once weekly:  CBC/D and BMP, Labs - Every other week:  ESR and CRP Method of administration: IV Push Method of administration may be changed at the discretion of home infusion pharmacist based  upon assessment of the patient and/or caregiver's ability to self-administer the medication ordered. Start taking on: October 16, 2022   cholecalciferol 25 MCG (1000 UNIT) tablet Commonly known as: VITAMIN D3 Take 1,000 Units by mouth daily.   clonazePAM 1 MG tablet Commonly known as: KLONOPIN TAKE ONE TABLET BY MOUTH TWICE A DAY MAY TAKE ADDITIONAL TABLET IF NEEDED What changed: See the new instructions.   diphenoxylate-atropine 2.5-0.025 MG tablet Commonly known as: LOMOTIL Take 1 tablet by mouth 2 (two) times daily.   eletriptan 40 MG tablet Commonly known as: RELPAX TAKE ONE TABLET BY MOUTH AT ONSET OF HEADACHE; MAY REPEAT ONE TABLET IN 2 HOURS IF NEEDED. What changed: See the new instructions.   esomeprazole 20 MG capsule Commonly known as: NEXIUM Take 20 mg by mouth daily at 12 noon.   gabapentin 300 MG capsule Commonly known as: NEURONTIN TAKE THREE CAPSULES BY MOUTH EVERY NIGHT AT BEDTIME What changed: See the new instructions.   Linzess 290 MCG Caps capsule Generic drug: linaclotide Take 290 mcg by mouth daily.   Magnesium 400 MG Caps Take 400 mg by mouth 2 (two) times daily.   nafcillin  IVPB Infuse 12g of nafcillin continuously as an IV infusion Indication:  MSSA bacteremia / meningitis First Dose: No Last Day of Therapy:  10/15/2022 Labs - Once weekly:  CBC/D and BMP, Labs - Every other week:  ESR and CRP Method of administration: Elastomeric (Continuous infusion) Method of administration may be changed at the discretion of home infusion pharmacist based upon assessment of the patient and/or caregiver's ability to self-administer the medication ordered.   ondansetron 8 MG tablet Commonly known as: ZOFRAN TAKE 1 TABLET BY MOUTH EVERY 8 HOURS AS NEEDED FOR NAUSEA AND VOMITING What changed: See the new instructions.   oxyCODONE 20 mg 12 hr tablet Commonly known as: OXYCONTIN Take 1 tablet (20 mg total) by mouth every 12 (twelve) hours for 7 days.    Oxycodone HCl 10 MG Tabs Take 1 tablet (10 mg total) by mouth every 4 (four) hours as needed for severe pain ((score 7 to 10)).   potassium chloride SA 20 MEQ tablet Commonly known as: KLOR-CON M Take 1 tablet (20 mEq total) by mouth daily as needed (if more than 3 loose stools).   promethazine 12.5 MG tablet Commonly known as: PHENERGAN Take 12.5 mg by mouth every 6 (six) hours as needed for vomiting or nausea.   saccharomyces boulardii 250 MG capsule Commonly known as: FLORASTOR Take 1 capsule (250 mg total) by mouth 2 (two) times daily.   venlafaxine XR 150 MG 24 hr capsule Commonly known as: EFFEXOR-XR TAKE ONE CAPSULE BY MOUTH DAILY WITH BREAKFAST, Take with  the 75 mg = 225 mg daily. What changed:  how much to take how to take this when to take this additional instructions   venlafaxine XR 75 MG 24 hr capsule Commonly known as: EFFEXOR-XR TAKE ONE CAPSULE BY MOUTH EVERY MORNING BEFORE BREAKFAST What changed:  how much to take how to take this when to take this additional instructions               Durable Medical Equipment  (From admission, onward)           Start     Ordered   10/06/22 1241  For home use only DME Bedside commode  Once       Question:  Patient needs a bedside commode to treat with the following condition  Answer:  H/O laminectomy   10/06/22 1241   10/06/22 1240  For home use only DME Walker rolling  Once       Question Answer Comment  Walker: With [place]   Patient needs a walker to treat with the following condition H/O laminectomy      10/06/22 1241              Discharge Care Instructions  (From admission, onward)           Start     Ordered   10/08/22 0000  Change dressing on IV access line weekly and PRN  (Home infusion instructions - [facility] )        10/08/22 0950            Follow-up Information     [facility] Follow up.   Why: [facility] will be providing IV antibiotics for home.         [facility] Follow up.   Specialty: Home Health Services Why: Al[facility] Lemmings will be providing home health RN, PT at the home.  They will call you in the next 24-48 hours to set up [facility] services. Contact information: [address] [place] [address] [phone]         [facility] Follow up.   Why: [facility] delivered RW and 3:1 to your hospital room for home. Contact information: [place] [address] [phone]                 Consultations: Neurosurgery  ID  Procedures/Studies:  DG Lumbar Spine 2-3 Views  Result Date: 10/02/2022 CLINICAL DATA:  Lumbar spine surgery EXAM: LUMBAR SPINE - 2-3 VIEW COMPARISON:  MRI lumbar spine 09/28/2022 FINDINGS: Postsurgical changes compatible with L4-5 fusion and interbody spacer. L5-S1 degenerative changes. IMPRESSION: Postsurgical changes compatible with L4-5 fusion. Electronically Signed   By: Lovey Newcomer M.D.   On: 10/02/2022 16:49   DG C-Arm 1-60 Min-No Report  Result Date: 10/02/2022 Fluoroscopy was utilized by the requesting physician.  No radiographic interpretation.   DG C-Arm 1-60 Min-No Report  Result Date: 10/02/2022 Fluoroscopy was utilized by the requesting physician.  No radiographic interpretation.   [place] EKG SITE RITE  Result Date: 10/01/2022 If Site Rite image not attached, placement could not be confirmed due to current cardiac rhythm.  MR BRAIN W WO CONTRAST  Result Date: 09/29/2022 CLINICAL DATA:  MSSA bacteremia and headaches, concern for meningitis; recent lumbar spinal surgery EXAM: MRI HEAD WITHOUT AND WITH CONTRAST TECHNIQUE: Multiplanar, multiecho pulse sequences of the brain and surrounding structures were obtained without and with intravenous contrast. CONTRAST:  7.3mL GADAVIST GADOBUTROL 1 MMOL/ML IV SOLN COMPARISON:  MRI head 04/19/2016, correlation is also made with CT head 09/27/2022 FINDINGS: Brain: Mild diffuse smooth dural thickening, without  definite extension into the sulci. 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 No other abnormal parenchymal or leptomeningeal enhancement. No restricted diffusion to suggest acute or subacute infarct. No acute hemorrhage, mass, mass effect, or midline shift. No hydrocephalus or additional extra-axial collection. Normal pituitary and craniocervical junction. Vascular: Normal arterial flow voids. Normal arterial and venous enhancement. No evidence of venous sinus stenosis or distention. Skull and upper cervical spine: Normal marrow signal. Sinuses/Orbits: Clear paranasal sinuses. No acute finding in the orbits. Other: Fluid in the right mastoid air cells. IMPRESSION: 1. Mild diffuse smooth dural thickening, which is nonspecific but can be seen in the setting of bacterial meningitis. 2. No additional acute intracranial process. These results will be called to the ordering clinician or representative by the Radiologist Assistant, and communication documented in the PACS or [employer]. Electronically Signed   By: Merilyn Baba M.D.   On: 09/29/2022 22:23   ECHOCARDIOGRAM COMPLETE  Result Date: 09/28/2022    ECHOCARDIOGRAM REPORT   Patient Name:   Amanda [name] Date of Exam: 09/28/2022 Medical Rec #:  [id number]          Height:       68.0 in Accession #:    [id number]         Weight:       178.0 lb Date of Birth:  08-29-1960          BSA:          1.945 m Patient Age:    42 years           BP:           117/74 mmHg Patient Gender: F                  HR:           71 bpm. Exam Location:  Inpatient Procedure: 2D Echo, Cardiac Doppler, Color Doppler and Strain Analysis Indications:    Bacteremia R78.81  History:        Patient has no prior history of Echocardiogram examinations.                 Migraine.  Sonographer:    Ronny Flurry Referring Phys: [id number] [place]  1. Left ventricular ejection fraction, by estimation, is 60 to 65%. Left ventricular ejection fraction by 3D volume is 63 %. The left ventricle has normal function. The left ventricle has no regional wall motion abnormalities. Left ventricular diastolic  parameters are consistent with Grade I diastolic dysfunction (impaired relaxation).  2. Right ventricular systolic function is normal. The right ventricular size is normal. Tricuspid regurgitation signal is inadequate for assessing PA pressure.  3. The mitral valve is grossly normal. Trivial mitral valve regurgitation. No evidence of mitral stenosis.  4. The aortic valve is grossly normal. Aortic valve regurgitation is not visualized. No aortic stenosis is present.  5. The inferior vena cava is normal in size with greater than 50% respiratory variability, suggesting right atrial pressure of 3 mmHg. Conclusion(s)/Recommendation(s): No evidence of valvular vegetations on this transthoracic echocardiogram. Consider a transesophageal echocardiogram to exclude infective endocarditis if clinically indicated. FINDINGS  Left Ventricle: Left ventricular ejection fraction, by estimation, is 60 to 65%. Left ventricular ejection  fraction by 3D volume is 63 %. The left ventricle has normal function. The left ventricle has no regional wall motion abnormalities. Global longitudinal strain performed but not reported based on interpreter judgement due to suboptimal tracking. The left ventricular internal cavity size was normal in size. There is no left ventricular hypertrophy. Left ventricular diastolic parameters are consistent with Grade I diastolic dysfunction (impaired relaxation). Right Ventricle: The right ventricular size is normal. No increase in right ventricular wall thickness. Right ventricular systolic function is normal. Tricuspid regurgitation signal is inadequate for assessing PA pressure. Left Atrium: Left atrial size was normal in size. Right Atrium:  Right atrial size was normal in size. Pericardium: Trivial pericardial effusion is present. Mitral Valve: The mitral valve is grossly normal. Trivial mitral valve regurgitation. No evidence of mitral valve stenosis. Tricuspid Valve: The tricuspid valve is grossly normal. Tricuspid valve regurgitation is trivial. No evidence of tricuspid stenosis. Aortic Valve: The aortic valve is grossly normal. Aortic valve regurgitation is not visualized. No aortic stenosis is present. Aortic valve mean gradient measures 4.0 mmHg. Aortic valve peak gradient measures 6.8 mmHg. Aortic valve area, by VTI measures 3.04 cm. Pulmonic Valve: The pulmonic valve was grossly normal. Pulmonic valve regurgitation is not visualized. No evidence of pulmonic stenosis. Aorta: The aortic root and ascending aorta are structurally normal, with no evidence of dilitation. Venous: The inferior vena cava is normal in size with greater than 50% respiratory variability, suggesting right atrial pressure of 3 mmHg. IAS/Shunts: The atrial septum is grossly normal.  LEFT VENTRICLE PLAX 2D LVIDd:         4.10 cm         Diastology LVIDs:         2.80 cm         LV e' medial:    6.85 cm/s LV PW:         0.80 cm         LV E/e' medial:  9.8 LV IVS:        1.00 cm         LV e' lateral:   5.55 cm/s LVOT diam:     2.20 cm         LV E/e' lateral: 12.1 LV SV:         88 LV SV Index:   45 LVOT Area:     3.80 cm        3D Volume EF                                LV 3D EF:    Left                                             ventricul                                             ar                                             ejection  fraction                                             by 3D                                             volume is                                             63 %.                                 3D Volume EF:                                3D EF:        63 %                                LV  EDV:       183 ml                                LV ESV:       67 ml                                LV SV:        116 ml RIGHT VENTRICLE             IVC RV S prime:     12.40 cm/s  IVC diam: 1.40 cm TAPSE (M-mode): 2.2 cm LEFT ATRIUM           Index        RIGHT ATRIUM           Index LA diam:      2.80 cm 1.44 cm/m   RA Area:     12.60 cm LA Vol (A2C): 43.6 ml 22.42 ml/m  RA Volume:   26.90 ml  13.83 ml/m LA Vol (A4C): 27.9 ml 14.34 ml/m  AORTIC VALVE AV Area (Vmax):    3.18 cm AV Area (Vmean):   2.96 cm AV Area (VTI):     3.04 cm AV Vmax:           130.00 cm/s AV Vmean:          90.000 cm/s AV VTI:            0.291 m AV Peak Grad:      6.8 mmHg AV Mean Grad:      4.0 mmHg LVOT Vmax:         108.67 cm/s LVOT Vmean:        70.133 cm/s LVOT VTI:          0.233 m LVOT/AV VTI ratio: 0.80  AORTA Ao Root diam: 3.40 cm Ao Asc diam:  3.50 cm MITRAL VALVE MV Area (PHT): 3.48  cm    SHUNTS MV Decel Time: 218 msec    Systemic VTI:  0.23 m MV E velocity: 67.40 cm/s  Systemic Diam: 2.20 cm MV A velocity: 85.90 cm/s MV E/A ratio:  0.78 Eleonore Chiquito MD Electronically signed by Eleonore Chiquito MD Signature Date/Time: 09/28/2022/1:33:07 PM    Final    MR Lumbar Spine W Wo Contrast  Addendum Date: 09/28/2022   ADDENDUM REPORT: 09/28/2022 10:16 ADDENDUM: Critical Value/emergent results were called by telephone at the time of interpretation on 09/28/2022 at 10:14 am to provider KYLE CABBELL , who verbally acknowledged these results. Based on this discussion and the patient's clinical condition, the subdural collection is favored to represent hematoma. Electronically Signed   By: Emmit Alexanders M.D.   On: 09/28/2022 10:16   Result Date: 09/28/2022 CLINICAL DATA:  Wound infection. EXAM: MRI LUMBAR SPINE WITHOUT AND WITH CONTRAST TECHNIQUE: Multiplanar and multiecho pulse sequences of the lumbar spine were obtained without and with intravenous contrast. CONTRAST:  7.58mL GADAVIST GADOBUTROL 1 MMOL/ML IV SOLN COMPARISON:  Lumbar  spine CT 09/26/2022. Lumbar spine MRI 08/27/2022. FINDINGS: Segmentation: Conventional numbering is assumed with 5 non-rib-bearing, lumbar type vertebral bodies. Alignment:  Normal. Vertebrae: Interval postoperative changes right L4-5 hemilaminotomy and discectomy. Dorsal subdural fluid collection extending from L5 through at least T12 superiorly with few foci of susceptibility, likely representing gas at L2-3, possibly due to instrumentation for the previous day's image guided lumbar puncture. Other than the foci of susceptibility, this collection predominantly demonstrates fluid signal. This may represent hematoma versus empyema. Conus medullaris and cauda equina: Conus extends to the T12 level. Conus and cauda equina appear normal. Paraspinal and other soft tissues: Expected postoperative changes along the right aspect of the L4 spinous process and L4-5 interspace. Disc levels: The above mentioned subdural collection results in mild to moderate compression of the cauda equina at multiple levels. IMPRESSION: 1. Predominantly fluid signal, dorsal subdural fluid collection extending from L5 through at least T12 superiorly. This may represent hematoma versus empyema. 2. The subdural collection results in mild to moderate compression of the cauda equina at multiple levels. Electronically Signed: By: Emmit Alexanders M.D. On: 09/28/2022 09:50   DG FL GUIDED LUMBAR PUNCTURE  Result Date: 09/27/2022 CLINICAL DATA:  62 year old female with history of recent lumbar surgery admitted with altered mental status and bacteremia. Lumbar puncture requested to evaluate for possible CNS infection. EXAM: DIAGNOSTIC LUMBAR PUNCTURE UNDER FLUOROSCOPIC GUIDANCE COMPARISON:  None Available. FLUOROSCOPY: Radiation Exposure Index (as provided by the fluoroscopic device): 37.4 mGy [place] PROCEDURE: Informed consent was obtained from the patient prior to the procedure, including potential complications of headache, allergy, and pain. With  the patient prone, the lower back was prepped with Betadine. 1% Lidocaine was used for local anesthesia. Lumbar puncture was initially attempted at L1-2 via a midline approach using a 3.5 inch 22 gauge needle with return of blood into the needle hub but no CSF despite needle repositioning and reverse Trendelenburg positioning. Puncture was then attempted at T12-L1 with the same results. Puncture was then performed at L2-3 via a right interlaminar approach with return of bloody CSF which partially cleared. CSF flow was very slow despite reverse Trendelenburg positioning and slight needle adjustment. 3.5 mL of CSF were obtained for laboratory studies. The patient tolerated the procedure well and there were no apparent complications. This exam was performed by Rushie Nyhan, NP, and was supervised and interpreted by Logan Bores, MD. IMPRESSION: Technically successful fluoroscopically guided lumbar puncture. Only a small amount  of blood tinged CSF could be obtained as detailed above. Electronically Signed   By: Logan Bores M.D.   On: 09/27/2022 16:55   CT HEAD WO CONTRAST (5MM)  Result Date: 09/27/2022 CLINICAL DATA:  Bacteremia. Recent lumbar surgery. Assessment prior to planned lumbar puncture. EXAM: CT HEAD WITHOUT CONTRAST TECHNIQUE: Contiguous axial images were obtained from the base of the skull through the vertex without intravenous contrast. RADIATION DOSE REDUCTION: This exam was performed according to the departmental dose-optimization program which includes automated exposure control, adjustment of the mA and/or kV according to patient size and/or use of iterative reconstruction technique. COMPARISON:  Head CT 04/18/2016 and MRI 04/19/2016 FINDINGS: Brain: There is no evidence of an acute infarct, intracranial hemorrhage, mass, midline shift, or extra-axial fluid collection. The ventricles and sulci are normal. Vascular: Calcified atherosclerosis at the skull base. No hyperdense vessel. Skull: No  acute fracture or suspicious osseous lesion. Sinuses/Orbits: Visualized paranasal sinuses and mastoid air cells are clear. Unremarkable orbits. Other: None. IMPRESSION: Unremarkable CT appearance of the brain. Electronically Signed   By: Logan Bores M.D.   On: 09/27/2022 14:41   CT Lumbar Spine Wo Contrast  Result Date: 09/26/2022 CLINICAL DATA:  Spine surgery.  Postop infection suspected EXAM: CT LUMBAR SPINE WITHOUT CONTRAST TECHNIQUE: Multidetector CT imaging of the lumbar spine was performed without intravenous contrast administration. Multiplanar CT image reconstructions were also generated. RADIATION DOSE REDUCTION: This exam was performed according to the departmental dose-optimization program which includes automated exposure control, adjustment of the mA and/or kV according to patient size and/or use of iterative reconstruction technique. COMPARISON:  07/17/2021 FINDINGS: Segmentation: 5 lumbar type vertebrae. Alignment: Normal. Vertebrae: L4-5 right laminotomy. No endplate destruction or bone lesion seen. There is a nondisplaced fracture at the inferior articular process of L4 on the right. See sagittal reformats. Paraspinal and other soft tissues: Fat stranding in keeping with history of fairly recent surgery. There is anterior epidural soft tissue density at the level of L4-5 which is presumably residual disc, no gross collection but very limited beyond the subcutaneous tissues due to technique. Disc levels: Disc space narrowing with endplate degeneration primarily at L2-3 and below. No bony impingement. On soft tissue windows there is some degree of spinal stenosis at L3-4 and L4-5. IMPRESSION: 1. Changes of recent L4-5 right laminotomy. Nondisplaced L4 right inferior articular process fracture adjacent to the osteotomy. 2. No specific signs of osteomyelitis or infection. 3. Some degree of spinal stenosis at L3-4 and L4-5. Electronically Signed   By: Jorje Guild M.D.   On: 09/26/2022 10:37    DG Chest Port 1 View  Result Date: 09/26/2022 CLINICAL DATA:  Questionable sepsis EXAM: PORTABLE CHEST 1 VIEW COMPARISON:  06/08/2016 FINDINGS: Artifact from EKG leads. Normal heart size and mediastinal contours allowing for distortion due to rightward rotation. No acute infiltrate or edema. No effusion or pneumothorax. No acute osseous findings. IMPRESSION: No evidence of active disease. Electronically Signed   By: Jorje Guild M.D.   On: 09/26/2022 07:27     Subjective: - no chest pain, shortness of breath, no abdominal pain, nausea or vomiting.   Discharge Exam: BP (!) 140/79 (BP Location: Left Arm)   Pulse 82   Temp 98.6 F (37 C) (Oral)   Resp 16   Ht 5' 7.99" (1.727 m)   Wt 80.7 kg   SpO2 98%   BMI 27.07 kg/m   General: Pt is alert, awake, not in acute distress Cardiovascular: RRR, S1/S2 +, no rubs, no  gallops Respiratory: CTA bilaterally, no wheezing, no rhonchi Abdominal: Soft, NT, ND, bowel sounds + Extremities: no edema, no cyanosis    The results of significant diagnostics from this hospitalization (including imaging, microbiology, ancillary and laboratory) are listed below for reference.     Microbiology: Recent Results (from the past 240 hour(s))  Surgical pcr screen     Status: None   Collection Time: 10/02/22 12:00 PM   Specimen: Nasal Mucosa; Nasal Swab  Result Value Ref Range Status   MRSA, PCR NEGATIVE NEGATIVE Final   Staphylococcus aureus NEGATIVE NEGATIVE Final    Comment: (NOTE) The Xpert SA Assay (FDA approved for NASAL specimens in patients 79 years of age and older), is one component of a comprehensive surveillance program. It is not intended to diagnose infection nor to guide or monitor treatment. Performed at [facility], [place] [address] [place], [place] [address]   Gastrointestinal Panel by PCR , Stool     Status: None   Collection Time: 10/05/22  1:02 AM   Specimen: Stool  Result Value Ref Range Status   Campylobacter  species NOT DETECTED NOT DETECTED Final   Plesimonas shigelloides NOT DETECTED NOT DETECTED Final   Salmonella species NOT DETECTED NOT DETECTED Final   Yersinia enterocolitica NOT DETECTED NOT DETECTED Final   Vibrio species NOT DETECTED NOT DETECTED Final   Vibrio cholerae NOT DETECTED NOT DETECTED Final   Enteroaggregative E coli (EAEC) NOT DETECTED NOT DETECTED Final   Enteropathogenic E coli (EPEC) NOT DETECTED NOT DETECTED Final   Enterotoxigenic E coli (ETEC) NOT DETECTED NOT DETECTED Final   Shiga like toxin producing E coli (STEC) NOT DETECTED NOT DETECTED Final   Shigella/Enteroinvasive E coli (EIEC) NOT DETECTED NOT DETECTED Final   Cryptosporidium NOT DETECTED NOT DETECTED Final   Cyclospora cayetanensis NOT DETECTED NOT DETECTED Final   Entamoeba histolytica NOT DETECTED NOT DETECTED Final   Giardia lamblia NOT DETECTED NOT DETECTED Final   Adenovirus F40/41 NOT DETECTED NOT DETECTED Final   Astrovirus NOT DETECTED NOT DETECTED Final   Norovirus GI/GII NOT DETECTED NOT DETECTED Final   Rotavirus A NOT DETECTED NOT DETECTED Final   Sapovirus (I, II, IV, and V) NOT DETECTED NOT DETECTED Final    Comment: Performed at [facility], [address] [place], [place] [address]     Labs: Basic Metabolic Panel: Recent Labs  Lab 10/03/22 0130 10/04/22 0355 10/05/22 0455 10/06/22 0440 10/07/22 0317 10/08/22 0417 10/09/22 0431  NA 137   < > 134* 134* 136 133* 137  K 4.1   < > 3.8 3.4* 2.9* 3.4* 3.4*  CL 104   < > 99 96* 97* 100 101  CO2 20*   < > 26 23 25 23 25   GLUCOSE 84   < > 95 89 91 112* 103*  BUN 6*   < > <5* <5* 5* 6* 6*  CREATININE 0.87   < > 0.69 0.74 0.77 0.72 0.66  CALCIUM 8.4*   < > 8.7* 8.6* 8.8* 8.6* 8.7*  MG 2.0  --  1.8  --  1.9 1.8 2.1   < > = values in this interval not displayed.   Liver Function Tests: Recent Labs  Lab 10/03/22 0130 10/04/22 0355 10/06/22 0440 10/07/22 0317  AST 29 17 16 16   ALT 26 16 15 11   ALKPHOS 147* 108 130*  126  BILITOT 1.7* 1.2 1.5* 1.7*  PROT 6.1* 4.6* 6.4* 6.6  ALBUMIN 2.4* 1.7* 2.2* 2.3*  CBC: Recent Labs  Lab 10/05/22 0455 10/06/22 0440 10/07/22 0317 10/08/22 0417 10/09/22 0431  WBC 11.0* 13.9* 14.5* 10.6* 10.1  HGB 9.7* 10.1* 10.0* 9.6* 9.4*  HCT 29.5* 30.3* 30.3* 29.7* 30.3*  MCV 87.5 85.8 87.3 88.1 90.4  PLT 391 401* 491* 478* 529*   CBG: Recent Labs  Lab 10/07/22 0149  GLUCAP 104*   Hgb A1c No results for input(s): "HGBA1C" in the last 72 hours. Lipid Profile No results for input(s): "CHOL", "HDL", "LDLCALC", "TRIG", "CHOLHDL", "LDLDIRECT" in the last 72 hours. Thyroid function studies No results for input(s): "TSH", "T4TOTAL", "T3FREE", "THYROIDAB" in the last 72 hours.  Invalid input(s): "FREET3" Urinalysis    Component Value Date/Time   COLORURINE YELLOW 09/26/2022 1438   APPEARANCEUR HAZY (A) 09/26/2022 1438   LABSPEC 1.012 09/26/2022 1438   PHURINE 5.0 09/26/2022 1438   GLUCOSEU NEGATIVE 09/26/2022 1438   HGBUR MODERATE (A) 09/26/2022 1438   BILIRUBINUR NEGATIVE 09/26/2022 1438   KETONESUR NEGATIVE 09/26/2022 1438   PROTEINUR NEGATIVE 09/26/2022 1438   NITRITE POSITIVE (A) 09/26/2022 1438   LEUKOCYTESUR TRACE (A) 09/26/2022 1438    FURTHER DISCHARGE INSTRUCTIONS:   Get Medicines reviewed and adjusted: Please take all your medications with you for your next visit with your Primary MD   Laboratory/radiological data: Please request your Primary MD to go over all hospital tests and procedure/radiological results at the follow up, please ask your Primary MD to get all Hospital records sent to his/her office.   In some cases, they will be blood work, cultures and biopsy results pending at the time of your discharge. Please request that your primary care M.D. goes through all the records of your hospital data and follows up on these results.   Also Note the following: If you experience worsening of your admission symptoms, develop shortness of breath,  life threatening emergency, suicidal or homicidal thoughts you must seek medical attention immediately by calling 911 or calling your MD immediately  if symptoms less severe.   You must read complete instructions/literature along with all the possible adverse reactions/side effects for all the Medicines you take and that have been prescribed to you. Take any new Medicines after you have completely understood and accpet all the possible adverse reactions/side effects.    Do not drive when taking Pain medications or sleeping medications (Benzodaizepines)   Do not take more than prescribed Pain, Sleep and Anxiety Medications. It is not advisable to combine anxiety,sleep and pain medications without talking with your primary care practitioner   Special Instructions: If you have smoked or chewed Tobacco  in the last 2 yrs please stop smoking, stop any regular Alcohol  and or any Recreational drug use.   Wear Seat belts while driving.   Please note: You were cared for by a hospitalist during your hospital stay. Once you are discharged, your primary care physician will handle any further medical issues. Please note that NO REFILLS for any discharge medications will be authorized once you are discharged, as it is imperative that you return to your primary care physician (or establish a relationship with a primary care physician if you do not have one) for your post hospital discharge needs so that they can reassess your need for medications and monitor your lab values.  Time coordinating discharge: 40 minutes  SIGNED:  Marzetta Board, MD, PhD 10/09/2022, 8:27 AM

## 2022-10-09 NOTE — Plan of Care (Signed)
  Problem: Fluid Volume: Goal: Hemodynamic stability will improve Outcome: Progressing   Problem: Clinical Measurements: Goal: Diagnostic test results will improve Outcome: Progressing Goal: Signs and symptoms of infection will decrease Outcome: Progressing   Problem: Respiratory: Goal: Ability to maintain adequate ventilation will improve Outcome: Progressing   Problem: Education: Goal: Knowledge of General Education information will improve Description: Including pain rating scale, medication(s)/side effects and non-pharmacologic comfort measures Outcome: Progressing   Problem: Health Behavior/Discharge Planning: Goal: Ability to manage health-related needs will improve Outcome: Progressing   Problem: Clinical Measurements: Goal: Ability to maintain clinical measurements within normal limits will improve Outcome: Progressing Goal: Will remain free from infection Outcome: Progressing Goal: Diagnostic test results will improve Outcome: Progressing Goal: Respiratory complications will improve Outcome: Progressing Goal: Cardiovascular complication will be avoided Outcome: Progressing   Problem: Activity: Goal: Risk for activity intolerance will decrease Outcome: Progressing   Problem: Nutrition: Goal: Adequate nutrition will be maintained Outcome: Progressing   Problem: Coping: Goal: Level of anxiety will decrease Outcome: Progressing   Problem: Elimination: Goal: Will not experience complications related to bowel motility Outcome: Progressing Goal: Will not experience complications related to urinary retention Outcome: Progressing   Problem: Pain Managment: Goal: General experience of comfort will improve Outcome: Progressing   Problem: Safety: Goal: Ability to remain free from injury will improve Outcome: Progressing   Problem: Skin Integrity: Goal: Risk for impaired skin integrity will decrease Outcome: Progressing   Problem: Education: Goal: Ability  to verbalize activity precautions or restrictions will improve Outcome: Progressing Goal: Knowledge of the prescribed therapeutic regimen will improve Outcome: Progressing Goal: Understanding of discharge needs will improve Outcome: Progressing   Problem: Activity: Goal: Ability to avoid complications of mobility impairment will improve Outcome: Progressing Goal: Ability to tolerate increased activity will improve Outcome: Progressing Goal: Will remain free from falls Outcome: Progressing   Problem: Bowel/Gastric: Goal: Gastrointestinal status for postoperative course will improve Outcome: Progressing   Problem: Clinical Measurements: Goal: Ability to maintain clinical measurements within normal limits will improve Outcome: Progressing Goal: Postoperative complications will be avoided or minimized Outcome: Progressing Goal: Diagnostic test results will improve Outcome: Progressing   Problem: Pain Management: Goal: Pain level will decrease Outcome: Progressing   Problem: Skin Integrity: Goal: Will show signs of wound healing Outcome: Progressing   Problem: Health Behavior/Discharge Planning: Goal: Identification of resources available to assist in meeting health care needs will improve Outcome: Progressing   Problem: Bladder/Genitourinary: Goal: Urinary functional status for postoperative course will improve Outcome: Progressing

## 2022-10-10 DIAGNOSIS — G43019 Migraine without aura, intractable, without status migrainosus: Secondary | ICD-10-CM | POA: Diagnosis not present

## 2022-10-10 DIAGNOSIS — A4101 Sepsis due to Methicillin susceptible Staphylococcus aureus: Secondary | ICD-10-CM | POA: Diagnosis not present

## 2022-10-10 DIAGNOSIS — Z792 Long term (current) use of antibiotics: Secondary | ICD-10-CM | POA: Diagnosis not present

## 2022-10-10 DIAGNOSIS — G43819 Other migraine, intractable, without status migrainosus: Secondary | ICD-10-CM | POA: Diagnosis not present

## 2022-10-10 DIAGNOSIS — G9340 Encephalopathy, unspecified: Secondary | ICD-10-CM | POA: Diagnosis not present

## 2022-10-10 DIAGNOSIS — Z981 Arthrodesis status: Secondary | ICD-10-CM | POA: Diagnosis not present

## 2022-10-10 DIAGNOSIS — T8144XA Sepsis following a procedure, initial encounter: Secondary | ICD-10-CM | POA: Diagnosis not present

## 2022-10-10 DIAGNOSIS — F32A Depression, unspecified: Secondary | ICD-10-CM | POA: Diagnosis not present

## 2022-10-10 DIAGNOSIS — Z9181 History of falling: Secondary | ICD-10-CM | POA: Diagnosis not present

## 2022-10-10 DIAGNOSIS — G2581 Restless legs syndrome: Secondary | ICD-10-CM | POA: Diagnosis not present

## 2022-10-10 DIAGNOSIS — B9561 Methicillin susceptible Staphylococcus aureus infection as the cause of diseases classified elsewhere: Secondary | ICD-10-CM | POA: Diagnosis not present

## 2022-10-10 DIAGNOSIS — T8143XA Infection following a procedure, organ and space surgical site, initial encounter: Secondary | ICD-10-CM | POA: Diagnosis not present

## 2022-10-10 DIAGNOSIS — G003 Staphylococcal meningitis: Secondary | ICD-10-CM | POA: Diagnosis not present

## 2022-10-10 DIAGNOSIS — Z452 Encounter for adjustment and management of vascular access device: Secondary | ICD-10-CM | POA: Diagnosis not present

## 2022-10-10 DIAGNOSIS — G9609 Other spinal cerebrospinal fluid leak: Secondary | ICD-10-CM | POA: Diagnosis not present

## 2022-10-11 DIAGNOSIS — Z5181 Encounter for therapeutic drug level monitoring: Secondary | ICD-10-CM | POA: Diagnosis not present

## 2022-10-11 DIAGNOSIS — Z981 Arthrodesis status: Secondary | ICD-10-CM | POA: Diagnosis not present

## 2022-10-11 DIAGNOSIS — G9340 Encephalopathy, unspecified: Secondary | ICD-10-CM | POA: Diagnosis not present

## 2022-10-11 DIAGNOSIS — G9609 Other spinal cerebrospinal fluid leak: Secondary | ICD-10-CM | POA: Diagnosis not present

## 2022-10-11 DIAGNOSIS — R7881 Bacteremia: Secondary | ICD-10-CM | POA: Diagnosis not present

## 2022-10-11 DIAGNOSIS — G43819 Other migraine, intractable, without status migrainosus: Secondary | ICD-10-CM | POA: Diagnosis not present

## 2022-10-11 DIAGNOSIS — Z452 Encounter for adjustment and management of vascular access device: Secondary | ICD-10-CM | POA: Diagnosis not present

## 2022-10-11 DIAGNOSIS — T8143XA Infection following a procedure, organ and space surgical site, initial encounter: Secondary | ICD-10-CM | POA: Diagnosis not present

## 2022-10-11 DIAGNOSIS — A4101 Sepsis due to Methicillin susceptible Staphylococcus aureus: Secondary | ICD-10-CM | POA: Diagnosis not present

## 2022-10-11 DIAGNOSIS — G43019 Migraine without aura, intractable, without status migrainosus: Secondary | ICD-10-CM | POA: Diagnosis not present

## 2022-10-11 DIAGNOSIS — Z792 Long term (current) use of antibiotics: Secondary | ICD-10-CM | POA: Diagnosis not present

## 2022-10-11 DIAGNOSIS — G003 Staphylococcal meningitis: Secondary | ICD-10-CM | POA: Diagnosis not present

## 2022-10-11 DIAGNOSIS — Z9181 History of falling: Secondary | ICD-10-CM | POA: Diagnosis not present

## 2022-10-11 DIAGNOSIS — T8144XA Sepsis following a procedure, initial encounter: Secondary | ICD-10-CM | POA: Diagnosis not present

## 2022-10-11 DIAGNOSIS — G2581 Restless legs syndrome: Secondary | ICD-10-CM | POA: Diagnosis not present

## 2022-10-11 DIAGNOSIS — F32A Depression, unspecified: Secondary | ICD-10-CM | POA: Diagnosis not present

## 2022-10-12 LAB — LAB REPORT - SCANNED: EGFR: 92

## 2022-10-12 LAB — ECHO TEE

## 2022-10-16 DIAGNOSIS — R7881 Bacteremia: Secondary | ICD-10-CM | POA: Diagnosis not present

## 2022-10-18 ENCOUNTER — Telehealth: Payer: Self-pay

## 2022-10-18 ENCOUNTER — Encounter: Payer: Self-pay | Admitting: Physician Assistant

## 2022-10-18 ENCOUNTER — Ambulatory Visit (INDEPENDENT_AMBULATORY_CARE_PROVIDER_SITE_OTHER): Payer: Medicare Other | Admitting: Physician Assistant

## 2022-10-18 DIAGNOSIS — G9340 Encephalopathy, unspecified: Secondary | ICD-10-CM | POA: Diagnosis not present

## 2022-10-18 DIAGNOSIS — G9609 Other spinal cerebrospinal fluid leak: Secondary | ICD-10-CM | POA: Diagnosis not present

## 2022-10-18 DIAGNOSIS — A4101 Sepsis due to Methicillin susceptible Staphylococcus aureus: Secondary | ICD-10-CM | POA: Diagnosis not present

## 2022-10-18 DIAGNOSIS — Z981 Arthrodesis status: Secondary | ICD-10-CM | POA: Diagnosis not present

## 2022-10-18 DIAGNOSIS — T8143XA Infection following a procedure, organ and space surgical site, initial encounter: Secondary | ICD-10-CM | POA: Diagnosis not present

## 2022-10-18 DIAGNOSIS — G43019 Migraine without aura, intractable, without status migrainosus: Secondary | ICD-10-CM | POA: Diagnosis not present

## 2022-10-18 DIAGNOSIS — Z792 Long term (current) use of antibiotics: Secondary | ICD-10-CM | POA: Diagnosis not present

## 2022-10-18 DIAGNOSIS — G2581 Restless legs syndrome: Secondary | ICD-10-CM

## 2022-10-18 DIAGNOSIS — Z5181 Encounter for therapeutic drug level monitoring: Secondary | ICD-10-CM | POA: Diagnosis not present

## 2022-10-18 DIAGNOSIS — Z9289 Personal history of other medical treatment: Secondary | ICD-10-CM | POA: Diagnosis not present

## 2022-10-18 DIAGNOSIS — Z452 Encounter for adjustment and management of vascular access device: Secondary | ICD-10-CM | POA: Diagnosis not present

## 2022-10-18 DIAGNOSIS — G43819 Other migraine, intractable, without status migrainosus: Secondary | ICD-10-CM | POA: Diagnosis not present

## 2022-10-18 DIAGNOSIS — F411 Generalized anxiety disorder: Secondary | ICD-10-CM | POA: Diagnosis not present

## 2022-10-18 DIAGNOSIS — T8144XA Sepsis following a procedure, initial encounter: Secondary | ICD-10-CM | POA: Diagnosis not present

## 2022-10-18 DIAGNOSIS — Z9181 History of falling: Secondary | ICD-10-CM | POA: Diagnosis not present

## 2022-10-18 DIAGNOSIS — F3341 Major depressive disorder, recurrent, in partial remission: Secondary | ICD-10-CM | POA: Diagnosis not present

## 2022-10-18 DIAGNOSIS — G003 Staphylococcal meningitis: Secondary | ICD-10-CM | POA: Diagnosis not present

## 2022-10-18 DIAGNOSIS — F32A Depression, unspecified: Secondary | ICD-10-CM | POA: Diagnosis not present

## 2022-10-18 DIAGNOSIS — Z9189 Other specified personal risk factors, not elsewhere classified: Secondary | ICD-10-CM

## 2022-10-18 MED ORDER — VENLAFAXINE HCL ER 75 MG PO CP24: 75.0000 mg | ORAL_CAPSULE | Freq: Every day | ORAL | 11 refills | Status: DC

## 2022-10-18 MED ORDER — ONDANSETRON HCL 8 MG PO TABS: ORAL_TABLET | ORAL | 0 refills | Status: DC

## 2022-10-18 MED ORDER — CLONAZEPAM 1 MG PO TABS: ORAL_TABLET | ORAL | 5 refills | Status: DC

## 2022-10-18 MED ORDER — VENLAFAXINE HCL ER 150 MG PO CP24: ORAL_CAPSULE | ORAL | 11 refills | Status: DC

## 2022-10-18 NOTE — Progress Notes (Signed)
[facility] Med Check  Patient ID: Amanda [name],  MRN: [id number]  PCP: Carol Ada, MD  Date of Evaluation: 10/18/2022 Time spent:30 minutes  Chief Complaint:  Chief Complaint   Depression; Anxiety; Follow-up    HISTORY/CURRENT STATUS: HPI  For routine med check.  Has been really sick, see ROS. Dx w/ meningitis and sepsis. On IV antibiotics daily now, has a PICC line, under care of home health. Is itching all over, also having diarrhea. Nauseated as well and out of Zofran, which she usually has for migraine associated n/v. Has a routine neuro later this week.   Before she got sick and was in the hosp for 2 wks, her mental health was good. Had been able to enjoy things, energy/motivation are fair to good. Is sleeping well some nights.. ADLs and personal hygiene are good. More able to do little things around the house now. Not crying easily. No SI/HI.  Patient denies increased energy with decreased need for sleep, increased talkativeness, racing thoughts, impulsivity or risky behaviors, increased spending, increased libido, grandiosity, increased irritability or anger, paranoia, or hallucinations.  Review of Systems  Constitutional:  Positive for malaise/fatigue.  HENT: Negative.    Eyes: Negative.   Respiratory: Negative.    Cardiovascular: Negative.   Gastrointestinal: Negative.   Genitourinary: Negative.   Musculoskeletal: Negative.   Skin: Negative.   Neurological:  Positive for headaches.  Endo/Heme/Allergies: Negative.   Psychiatric/Behavioral:         See HPI   Individual Medical History/ Review of Systems: Changes? :Yes    hospitalized 09/26/2022 altered mental status, low back pain neck stiffness, and headaches.  Had sepsis sepsis and MSSA bacteremia.  Given IV antibiotics.   Had lumbar spine surgery in Feb. Healed fine from that.   Past medications for mental health diagnoses include: Effexor, Wellbutrin, Prozac caused weight loss and GI, Relpax,  Klonopin  Allergies: Wellbutrin [bupropion] and Aspirin  Current Medications:  Current Outpatient Medications:    aspirin-acetaminophen-caffeine (EXCEDRIN MIGRAINE) 250-250-65 MG tablet, Take 2 tablets by mouth every 6 (six) hours as needed for headache or migraine., Disp: , Rfl:    Calcium Carbonate-Vit D-Min (CALCIUM 1200 PO), Take 1,200 mg by mouth 2 (two) times daily. , Disp: , Rfl:    ceFAZolin (ANCEF) IVPB, Inject 2 g into the vein every 8 (eight) hours for 27 days. Indication:  MSSA bacteremia / menigitis First Dose: Yes Last Day of Therapy:  11/12/2022 Labs - Once weekly:  CBC/D and BMP, Labs - Every other week:  ESR and CRP Method of administration: IV Push Method of administration may be changed at the discretion of home infusion pharmacist based upon assessment of the patient and/or caregiver's ability to self-administer the medication ordered., Disp: 81 Units, Rfl: 0   cholecalciferol (VITAMIN D3) 25 MCG (1000 UNIT) tablet, Take 1,000 Units by mouth daily., Disp: , Rfl:    diphenoxylate-atropine (LOMOTIL) 2.5-0.025 MG tablet, Take 1 tablet by mouth 2 (two) times daily., Disp: 30 tablet, Rfl: 0   eletriptan (RELPAX) 40 MG tablet, TAKE ONE TABLET BY MOUTH AT ONSET OF HEADACHE; MAY REPEAT ONE TABLET IN 2 HOURS IF NEEDED. (Patient taking differently: Take 40 mg by mouth every 2 (two) hours as needed for headache.), Disp: 10 tablet, Rfl: 11   esomeprazole (NEXIUM) 20 MG capsule, Take 20 mg by mouth daily at 12 noon., Disp: , Rfl:    gabapentin (NEURONTIN) 300 MG capsule, TAKE THREE CAPSULES BY MOUTH EVERY NIGHT AT BEDTIME (Patient taking differently: 900 mg  at bedtime. TAKE THREE CAPSULES BY MOUTH EVERY NIGHT AT BEDTIME), Disp: 270 capsule, Rfl: 1   Magnesium 400 MG CAPS, Take 400 mg by mouth 2 (two) times daily. , Disp: , Rfl:    potassium chloride SA (KLOR-CON M) 20 MEQ tablet, Take 1 tablet (20 mEq total) by mouth daily as needed (if more than 3 loose stools)., Disp: 20 tablet, Rfl: 0    promethazine (PHENERGAN) 12.5 MG tablet, Take 12.5 mg by mouth every 6 (six) hours as needed for vomiting or nausea., Disp: , Rfl:    clonazePAM (KLONOPIN) 1 MG tablet, TAKE ONE TABLET BY MOUTH TWICE A DAY MAY TAKE ADDITIONAL TABLET IF NEEDED, Disp: 75 tablet, Rfl: 5   LINZESS 290 MCG CAPS capsule, Take 290 mcg by mouth daily. (Patient not taking: Reported on 10/18/2022), Disp: , Rfl:    ondansetron (ZOFRAN) 8 MG tablet, TAKE 1 TABLET BY MOUTH EVERY 8 HOURS AS NEEDED FOR NAUSEA AND VOMITING, Disp: 30 tablet, Rfl: 0   oxyCODONE 10 MG TABS, Take 1 tablet (10 mg total) by mouth every 4 (four) hours as needed for severe pain ((score 7 to 10)). (Patient not taking: Reported on 10/18/2022), Disp: 35 tablet, Rfl: 0   saccharomyces boulardii (FLORASTOR) 250 MG capsule, Take 1 capsule (250 mg total) by mouth 2 (two) times daily. (Patient not taking: Reported on 10/18/2022), Disp: 60 capsule, Rfl: 0   venlafaxine XR (EFFEXOR-XR) 150 MG 24 hr capsule, Take with the 75 mg = 225 mg daily., Disp: 30 capsule, Rfl: 11   venlafaxine XR (EFFEXOR-XR) 75 MG 24 hr capsule, Take 1 capsule (75 mg total) by mouth daily with breakfast. Does take with 150mg , Disp: 30 capsule, Rfl: 11 Medication Side Effects: none  Family Medical/ Social History: Changes?  none  MENTAL HEALTH EXAM:  There were no vitals taken for this visit.There is no height or weight on file to calculate BMI.  General Appearance: Casual and Well Groomed  Eye Contact:  Good  Speech:  Clear and Coherent and Normal Rate  Volume:  Normal  Mood:   Sad  Affect:  Congruent  Thought Process:  Goal Directed and Descriptions of Associations: Circumstantial  Orientation:  Full (Time, Place, and Person)  Thought Content: Logical   Suicidal Thoughts:  No  Homicidal Thoughts:  No  Memory:  Immediate;   Fair Recent;   Fair Remote;   Good  Judgement:  Good  Insight:  Good  Psychomotor Activity:  Normal  Concentration:  Concentration: Good and Attention Span:  Fair  Recall:  Good  Fund of Knowledge: Good  Language: Good  Assets:  Desire for Improvement Financial Resources/Insurance Housing Transportation  ADL's:  Intact  Cognition: WNL  Prognosis:  Good   Reviewed discharge summary from hospitalization 3/32024-10/09/2022  DIAGNOSES:    ICD-10-CM   1. Recurrent major depressive disorder, in partial remission ([place])  F33.41     2. Generalized anxiety disorder  F41.1     3. Restless leg syndrome  G25.81     4. History of recent hospitalization  Z92.89     5. Adjustment to life threatening illness  Z91.89       Receiving Psychotherapy: No    Dr. Jonni Sanger Mitchum in the past  RECOMMENDATIONS:  PDMP was reviewed.  Klonopin filled 09/27/2022.  Oxycodone filled 10/09/2022.   I provided 30 minutes of face to face time during this encounter, including time spent before and after the visit in records review, medical decision making, counseling pertinent to  today's visit, and charting.   I'm sorry to hear about her recent illness but glad to hear she's improving.  From a mental health medication standpoint, she's stable so no change needed.  Continue Klonopin 1 mg, 1 p.o. 3 times daily as needed but limit to 2.5 mg a day. Continue Relpax 40 mg, 1 p.o. daily as needed migraine, may repeat in 2 hours as needed.  Neurology now prescribes. Continue gabapentin 300 mg, 3 p.o. nightly. Continue Zofran 8 mg, 1 p.o. every 8 hours as needed nausea and vomiting. Continue Phenergan 12.5 mg, 1 p.o. every 8 hours as needed nausea and vomiting. Continue Effexor XR 150 mg, +75 mg to equal 225 mg daily. Return in 2 months.  Donnal Moat, PA-C

## 2022-10-18 NOTE — Telephone Encounter (Signed)
Amanda [name] w/ [facility] called stating patient is complaining of tingling in her tongue, itching without a rash, joints aching and diarrhea. Patient reports she feels this is coming from the cefazolin, so she holding off on taking it at this time.  Please advise.  [place] Amanda [name], CMA

## 2022-10-19 ENCOUNTER — Telehealth: Payer: Self-pay | Admitting: Pharmacist

## 2022-10-19 DIAGNOSIS — R7881 Bacteremia: Secondary | ICD-10-CM | POA: Diagnosis not present

## 2022-10-19 LAB — LAB REPORT - SCANNED: EGFR: 69

## 2022-10-19 NOTE — Telephone Encounter (Signed)
I spoke with the patient this morning and advised her that per Dr. Tommy Medal he would like to switch her to vancomycin. Patient has tolerated the vancomycin in the past.  I have sent a message to [facility] to update them on te medication change. I will have Dr. Tommy Medal update OPAT note for the patient.  Ashlei Chinchilla T Brooks Sailors

## 2022-10-19 NOTE — Telephone Encounter (Signed)
Can we make sure they have orders for weekly vancomycin trough as well for the time being? Thanks Diminique!

## 2022-10-19 NOTE — Telephone Encounter (Signed)
Called [facility] to give verbal orders to switch patient's antibiotics from Ancef to Vancomycin. Spoke to [place], . Gave verbal to give vanc loading dose of 2000 mg x 1 then start 1750 mg IV q12 hr. Patient's CrCl is >100, SCr is stable at 0.66. Will keep a close eye on her labs and decrease dose, if needed. Also gave verbal for twice weekly vanc trough. BMP still ordered for weekly as of right now but will increase, if needed. Cassy verbalized understanding.   Leondre Taul L. Bud Kaeser, PharmD, BCIDP, AAHIVP, CPP Clinical Pharmacist Practitioner Infectious Diseases [facility] 10/19/2022, 10:42 AM

## 2022-10-20 DIAGNOSIS — G43711 Chronic migraine without aura, intractable, with status migrainosus: Secondary | ICD-10-CM | POA: Diagnosis not present

## 2022-10-20 DIAGNOSIS — Z1330 Encounter for screening examination for mental health and behavioral disorders, unspecified: Secondary | ICD-10-CM | POA: Diagnosis not present

## 2022-10-22 DIAGNOSIS — G43019 Migraine without aura, intractable, without status migrainosus: Secondary | ICD-10-CM | POA: Diagnosis not present

## 2022-10-22 DIAGNOSIS — G003 Staphylococcal meningitis: Secondary | ICD-10-CM | POA: Diagnosis not present

## 2022-10-22 DIAGNOSIS — Z792 Long term (current) use of antibiotics: Secondary | ICD-10-CM | POA: Diagnosis not present

## 2022-10-22 DIAGNOSIS — Z981 Arthrodesis status: Secondary | ICD-10-CM | POA: Diagnosis not present

## 2022-10-22 DIAGNOSIS — B9561 Methicillin susceptible Staphylococcus aureus infection as the cause of diseases classified elsewhere: Secondary | ICD-10-CM | POA: Diagnosis not present

## 2022-10-22 DIAGNOSIS — R7881 Bacteremia: Secondary | ICD-10-CM | POA: Diagnosis not present

## 2022-10-22 DIAGNOSIS — A4101 Sepsis due to Methicillin susceptible Staphylococcus aureus: Secondary | ICD-10-CM | POA: Diagnosis not present

## 2022-10-22 DIAGNOSIS — T8144XA Sepsis following a procedure, initial encounter: Secondary | ICD-10-CM | POA: Diagnosis not present

## 2022-10-22 DIAGNOSIS — T8143XA Infection following a procedure, organ and space surgical site, initial encounter: Secondary | ICD-10-CM | POA: Diagnosis not present

## 2022-10-22 DIAGNOSIS — G9340 Encephalopathy, unspecified: Secondary | ICD-10-CM | POA: Diagnosis not present

## 2022-10-22 DIAGNOSIS — Z9181 History of falling: Secondary | ICD-10-CM | POA: Diagnosis not present

## 2022-10-22 DIAGNOSIS — G9609 Other spinal cerebrospinal fluid leak: Secondary | ICD-10-CM | POA: Diagnosis not present

## 2022-10-22 DIAGNOSIS — Z452 Encounter for adjustment and management of vascular access device: Secondary | ICD-10-CM | POA: Diagnosis not present

## 2022-10-22 DIAGNOSIS — G2581 Restless legs syndrome: Secondary | ICD-10-CM | POA: Diagnosis not present

## 2022-10-22 DIAGNOSIS — G43819 Other migraine, intractable, without status migrainosus: Secondary | ICD-10-CM | POA: Diagnosis not present

## 2022-10-22 DIAGNOSIS — F32A Depression, unspecified: Secondary | ICD-10-CM | POA: Diagnosis not present

## 2022-10-23 DIAGNOSIS — Z792 Long term (current) use of antibiotics: Secondary | ICD-10-CM | POA: Diagnosis not present

## 2022-10-23 DIAGNOSIS — Z981 Arthrodesis status: Secondary | ICD-10-CM | POA: Diagnosis not present

## 2022-10-23 DIAGNOSIS — G9340 Encephalopathy, unspecified: Secondary | ICD-10-CM | POA: Diagnosis not present

## 2022-10-23 DIAGNOSIS — G2581 Restless legs syndrome: Secondary | ICD-10-CM | POA: Diagnosis not present

## 2022-10-23 DIAGNOSIS — G003 Staphylococcal meningitis: Secondary | ICD-10-CM | POA: Diagnosis not present

## 2022-10-23 DIAGNOSIS — F32A Depression, unspecified: Secondary | ICD-10-CM | POA: Diagnosis not present

## 2022-10-23 DIAGNOSIS — G9609 Other spinal cerebrospinal fluid leak: Secondary | ICD-10-CM | POA: Diagnosis not present

## 2022-10-23 DIAGNOSIS — T8144XA Sepsis following a procedure, initial encounter: Secondary | ICD-10-CM | POA: Diagnosis not present

## 2022-10-23 DIAGNOSIS — G43019 Migraine without aura, intractable, without status migrainosus: Secondary | ICD-10-CM | POA: Diagnosis not present

## 2022-10-23 DIAGNOSIS — A4101 Sepsis due to Methicillin susceptible Staphylococcus aureus: Secondary | ICD-10-CM | POA: Diagnosis not present

## 2022-10-23 DIAGNOSIS — G43819 Other migraine, intractable, without status migrainosus: Secondary | ICD-10-CM | POA: Diagnosis not present

## 2022-10-23 DIAGNOSIS — T8143XA Infection following a procedure, organ and space surgical site, initial encounter: Secondary | ICD-10-CM | POA: Diagnosis not present

## 2022-10-23 DIAGNOSIS — F5001 Anorexia nervosa, restricting type: Secondary | ICD-10-CM | POA: Diagnosis not present

## 2022-10-23 DIAGNOSIS — Z9181 History of falling: Secondary | ICD-10-CM | POA: Diagnosis not present

## 2022-10-23 DIAGNOSIS — Z452 Encounter for adjustment and management of vascular access device: Secondary | ICD-10-CM | POA: Diagnosis not present

## 2022-10-23 LAB — LAB REPORT - SCANNED: EGFR: 84

## 2022-10-25 ENCOUNTER — Encounter: Payer: Self-pay | Admitting: Infectious Disease

## 2022-10-25 DIAGNOSIS — G9340 Encephalopathy, unspecified: Secondary | ICD-10-CM | POA: Diagnosis not present

## 2022-10-25 DIAGNOSIS — Z452 Encounter for adjustment and management of vascular access device: Secondary | ICD-10-CM | POA: Diagnosis not present

## 2022-10-25 DIAGNOSIS — Z5181 Encounter for therapeutic drug level monitoring: Secondary | ICD-10-CM | POA: Diagnosis not present

## 2022-10-25 DIAGNOSIS — Z792 Long term (current) use of antibiotics: Secondary | ICD-10-CM | POA: Diagnosis not present

## 2022-10-25 DIAGNOSIS — Z981 Arthrodesis status: Secondary | ICD-10-CM | POA: Diagnosis not present

## 2022-10-25 DIAGNOSIS — T8144XA Sepsis following a procedure, initial encounter: Secondary | ICD-10-CM | POA: Diagnosis not present

## 2022-10-25 DIAGNOSIS — G9609 Other spinal cerebrospinal fluid leak: Secondary | ICD-10-CM | POA: Diagnosis not present

## 2022-10-25 DIAGNOSIS — G2581 Restless legs syndrome: Secondary | ICD-10-CM | POA: Diagnosis not present

## 2022-10-25 DIAGNOSIS — G003 Staphylococcal meningitis: Secondary | ICD-10-CM | POA: Diagnosis not present

## 2022-10-25 DIAGNOSIS — Z9181 History of falling: Secondary | ICD-10-CM | POA: Diagnosis not present

## 2022-10-25 DIAGNOSIS — T8143XA Infection following a procedure, organ and space surgical site, initial encounter: Secondary | ICD-10-CM | POA: Diagnosis not present

## 2022-10-25 DIAGNOSIS — G43019 Migraine without aura, intractable, without status migrainosus: Secondary | ICD-10-CM | POA: Diagnosis not present

## 2022-10-25 DIAGNOSIS — F32A Depression, unspecified: Secondary | ICD-10-CM | POA: Diagnosis not present

## 2022-10-25 DIAGNOSIS — A4101 Sepsis due to Methicillin susceptible Staphylococcus aureus: Secondary | ICD-10-CM | POA: Diagnosis not present

## 2022-10-25 DIAGNOSIS — G43819 Other migraine, intractable, without status migrainosus: Secondary | ICD-10-CM | POA: Diagnosis not present

## 2022-10-26 LAB — LAB REPORT - SCANNED: EGFR: 84

## 2022-10-27 ENCOUNTER — Other Ambulatory Visit: Payer: Self-pay

## 2022-10-27 ENCOUNTER — Telehealth: Payer: Self-pay

## 2022-10-27 ENCOUNTER — Encounter: Payer: Self-pay | Admitting: Infectious Disease

## 2022-10-27 ENCOUNTER — Ambulatory Visit (INDEPENDENT_AMBULATORY_CARE_PROVIDER_SITE_OTHER): Payer: Medicare Other | Admitting: Infectious Disease

## 2022-10-27 VITALS — BP 162/93 | HR 95 | Temp 98.3°F | Wt 174.0 lb

## 2022-10-27 DIAGNOSIS — T8149XA Infection following a procedure, other surgical site, initial encounter: Secondary | ICD-10-CM

## 2022-10-27 DIAGNOSIS — G9609 Other spinal cerebrospinal fluid leak: Secondary | ICD-10-CM

## 2022-10-27 DIAGNOSIS — T847XXD Infection and inflammatory reaction due to other internal orthopedic prosthetic devices, implants and grafts, subsequent encounter: Secondary | ICD-10-CM

## 2022-10-27 DIAGNOSIS — G003 Staphylococcal meningitis: Secondary | ICD-10-CM

## 2022-10-27 DIAGNOSIS — R519 Headache, unspecified: Secondary | ICD-10-CM

## 2022-10-27 DIAGNOSIS — A419 Sepsis, unspecified organism: Secondary | ICD-10-CM | POA: Diagnosis not present

## 2022-10-27 DIAGNOSIS — T7840XA Allergy, unspecified, initial encounter: Secondary | ICD-10-CM

## 2022-10-27 DIAGNOSIS — B9561 Methicillin susceptible Staphylococcus aureus infection as the cause of diseases classified elsewhere: Secondary | ICD-10-CM

## 2022-10-27 DIAGNOSIS — R7881 Bacteremia: Secondary | ICD-10-CM | POA: Diagnosis not present

## 2022-10-27 DIAGNOSIS — M25552 Pain in left hip: Secondary | ICD-10-CM

## 2022-10-27 DIAGNOSIS — T847XXA Infection and inflammatory reaction due to other internal orthopedic prosthetic devices, implants and grafts, initial encounter: Secondary | ICD-10-CM | POA: Diagnosis not present

## 2022-10-27 DIAGNOSIS — I10 Essential (primary) hypertension: Secondary | ICD-10-CM

## 2022-10-27 DIAGNOSIS — R6520 Severe sepsis without septic shock: Secondary | ICD-10-CM

## 2022-10-27 DIAGNOSIS — M25551 Pain in right hip: Secondary | ICD-10-CM

## 2022-10-27 DIAGNOSIS — L299 Pruritus, unspecified: Secondary | ICD-10-CM

## 2022-10-27 HISTORY — DX: Allergy, unspecified, initial encounter: T78.40XA

## 2022-10-27 NOTE — Patient Instructions (Signed)
We will switch you back to ancef with intent to get to original stop date I have made an appt with Dr. Juleen China for next week

## 2022-10-27 NOTE — Telephone Encounter (Signed)
Per Dr. Tommy Medal called [facility] with updated OPAT order.  Per Dr. Tommy Medal patient will restart Ancef 2 gm Q8h with original end date 11/12/22. Spoke with Teah at [employer] who was able to take verbal order.  Leatrice Jewels, RMA

## 2022-10-27 NOTE — Progress Notes (Signed)
Subjective:  Chief complaint: Having headaches as well as pain in her hips bilaterally and continued itching  Patient ID: Amanda [name], female    DOB: [birth year], 62 y.o.   MRN: [id number]  HPI  Amanda [name] is a 62 y.o. 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 She has been on nafcillin and has now undergone repair of CSF leak and placement of hardware  She completed 2 weeks of nafcillin was switched over to cefazolin.  The plan was for her to complete 6 weeks of systemic IV antibiotics.  In the interim she called our clinic with complaints of: tingling in her tongue, itching without a rash, joints aching and diarrhea she attributed to the cefazolin which she stopped.  I was wanting her to be seen while she was having the symptoms so we could really clearly identify whether she was having allergic reaction to the beta-lactam or not.  Unfortunately she could not be seen rapidly so we instead had her switch over to IV vancomycin.  Also had headaches though they were different than her migrainous headaches and different than the headache she was experiencing when she had a CSF leak.    She did tell me that in the days that she was not on antibiotics at all that her symptoms resolved but then while back on vancomycin she again began experiencing the headaches again as well as myalgias particularly in her hips bilaterally as well as itching.  She is also noticed herself to be a quite hypertensive with blood pressures ranging in the [id number] to [id number] systolic which is not normal for her.  The tingling sensation of the tongue did not return though I wonder if that was the beginning of a candidal infection of the oropharynx.  Not having had the opportunity to evaluate her in person IMs skeptical that her symptoms are due to an adverse drug reaction to the cefazolin.  I am particularly of this opinion given  the fact that most of the symptoms resumed when she went on a different unrelated IV antibiotic to vancomycin.  I will plan on switching her back to cefazolin.  I will also have her see my partner Dr. Juleen China who is seen in the hospital before next week to ensure she is tolerating the cefazolin.  I think if she continues to have symptoms we may need to get an MRI of the brain to ensure that she has not developed on epidural empyema or other intracranial infection from her infected cerebrospinal fluid.       Past Medical History:  Diagnosis Date   Altered mental status 04/19/2016   Depression    History of total hysterectomy    Hyperglycemia 04/19/2016   Intractable migraine without aura 12/04/2012   Migraine with intractable migraine    PONV (postoperative nausea and vomiting)    Restless leg syndrome     Past Surgical History:  Procedure Laterality Date   MAXIMUM ACCESS (MAS)POSTERIOR LUMBAR INTERBODY FUSION (PLIF) 1 LEVEL  10/02/2022   Procedure: POSTERIOR LUMBAR INTERBODY FUSION Lumbar Four - Five;  Surgeon: Ashok Pall, MD;  Location: [place];  Service: Neurosurgery;;   TEE WITHOUT CARDIOVERSION N/A 10/01/2022   Procedure: TRANSESOPHAGEAL ECHOCARDIOGRAM (TEE);  Surgeon: Hebert Soho, DO;  Location: [place];  Service: Cardiovascular;  Laterality: N/A;   TOTAL ABDOMINAL HYSTERECTOMY     WOUND EXPLORATION N/A 10/02/2022   Procedure: WOUND EXPLORATION;  Surgeon: Christella Noa,  Marylyn Ishihara, MD;  Location: [place];  Service: Neurosurgery;  Laterality: N/A;    Family History  Problem Relation Age of Onset   Dementia Mother    Lung cancer Father    Migraines Sister    Migraines Sister    Migraines Brother    Hypothyroidism Sister    Hypothyroidism Sister    Hypothyroidism Sister       Social History   Socioeconomic History   Marital status: Married    Spouse name: Mitzi Hansen   Number of children: 2   Years of education: 14   Highest education level: Not on file  Occupational  History   Occupation: N/A  Tobacco Use   Smoking status: Never   Smokeless tobacco: Never  Substance and Sexual Activity   Alcohol use: Yes    Alcohol/week: 1.0 standard drink of alcohol    Types: 1 Glasses of wine per week    Comment: one a year   Drug use: No   Sexual activity: Not on file  Other Topics Concern   Not on file  Social History Narrative   Patient lives at home with her husbandMitzi Hansen) and she is a Marine scientist. The patient has 2 daughters.    Patient drinks very little caffeine, maybe one cup daily.P   Patient has a college education.   Patient is currently not working.   Right-handed   Social Determinants of Health   Financial Resource Strain: Not on file  Food Insecurity: No Food Insecurity (09/27/2022)   Hunger Vital Sign    Worried About Running Out of Food in the Last Year: Never true    Ran Out of Food in the Last Year: Never true  Transportation Needs: No Transportation Needs (09/27/2022)   PRAPARE - Hydrologist (Medical): No    Lack of Transportation (Non-Medical): No  Physical Activity: Not on file  Stress: Not on file  Social Connections: Not on file    Allergies  Allergen Reactions   Wellbutrin [Bupropion]     GERD   Aspirin Other (See Comments)    Makes her ears ring     Current Outpatient Medications:    aspirin-acetaminophen-caffeine (EXCEDRIN MIGRAINE) 250-250-65 MG tablet, Take 2 tablets by mouth every 6 (six) hours as needed for headache or migraine., Disp: , Rfl:    Calcium Carbonate-Vit D-Min (CALCIUM 1200 PO), Take 1,200 mg by mouth 2 (two) times daily. , Disp: , Rfl:    cholecalciferol (VITAMIN D3) 25 MCG (1000 UNIT) tablet, Take 1,000 Units by mouth daily., Disp: , Rfl:    clonazePAM (KLONOPIN) 1 MG tablet, TAKE ONE TABLET BY MOUTH TWICE A DAY MAY TAKE ADDITIONAL TABLET IF NEEDED, Disp: 75 tablet, Rfl: 5   diphenoxylate-atropine (LOMOTIL) 2.5-0.025 MG tablet, Take 1 tablet by mouth 2 (two) times daily., Disp:  30 tablet, Rfl: 0   eletriptan (RELPAX) 40 MG tablet, TAKE ONE TABLET BY MOUTH AT ONSET OF HEADACHE; MAY REPEAT ONE TABLET IN 2 HOURS IF NEEDED. (Patient taking differently: Take 40 mg by mouth every 2 (two) hours as needed for headache.), Disp: 10 tablet, Rfl: 11   esomeprazole (NEXIUM) 20 MG capsule, Take 20 mg by mouth daily at 12 noon., Disp: , Rfl:    gabapentin (NEURONTIN) 300 MG capsule, TAKE THREE CAPSULES BY MOUTH EVERY NIGHT AT BEDTIME (Patient taking differently: 900 mg at bedtime. TAKE THREE CAPSULES BY MOUTH EVERY NIGHT AT BEDTIME), Disp: 270 capsule, Rfl: 1   LINZESS 290 MCG CAPS capsule,  Take 290 mcg by mouth daily. (Patient not taking: Reported on 10/18/2022), Disp: , Rfl:    Magnesium 400 MG CAPS, Take 400 mg by mouth 2 (two) times daily. , Disp: , Rfl:    ondansetron (ZOFRAN) 8 MG tablet, TAKE 1 TABLET BY MOUTH EVERY 8 HOURS AS NEEDED FOR NAUSEA AND VOMITING, Disp: 30 tablet, Rfl: 0   oxyCODONE 10 MG TABS, Take 1 tablet (10 mg total) by mouth every 4 (four) hours as needed for severe pain ((score 7 to 10)). (Patient not taking: Reported on 10/18/2022), Disp: 35 tablet, Rfl: 0   potassium chloride SA (KLOR-CON M) 20 MEQ tablet, Take 1 tablet (20 mEq total) by mouth daily as needed (if more than 3 loose stools)., Disp: 20 tablet, Rfl: 0   promethazine (PHENERGAN) 12.5 MG tablet, Take 12.5 mg by mouth every 6 (six) hours as needed for vomiting or nausea., Disp: , Rfl:    saccharomyces boulardii (FLORASTOR) 250 MG capsule, Take 1 capsule (250 mg total) by mouth 2 (two) times daily. (Patient not taking: Reported on 10/18/2022), Disp: 60 capsule, Rfl: 0   venlafaxine XR (EFFEXOR-XR) 150 MG 24 hr capsule, Take with the 75 mg = 225 mg daily., Disp: 30 capsule, Rfl: 11   venlafaxine XR (EFFEXOR-XR) 75 MG 24 hr capsule, Take 1 capsule (75 mg total) by mouth daily with breakfast. Does take with 150mg , Disp: 30 capsule, Rfl: 11   Review of Systems  Constitutional:  Negative for activity change,  appetite change, chills, diaphoresis, fatigue, fever and unexpected weight change.  HENT:  Negative for congestion, rhinorrhea, sinus pressure, sneezing, sore throat and trouble swallowing.   Eyes:  Negative for photophobia and visual disturbance.  Respiratory:  Negative for cough, chest tightness, shortness of breath, wheezing and stridor.   Cardiovascular:  Negative for chest pain, palpitations and leg swelling.  Gastrointestinal:  Negative for abdominal distention, abdominal pain, anal bleeding, blood in stool, constipation, diarrhea, nausea and vomiting.  Genitourinary:  Negative for difficulty urinating, dysuria, flank pain and hematuria.  Musculoskeletal:  Positive for arthralgias. Negative for back pain, gait problem, joint swelling and myalgias.  Skin:  Negative for color change, pallor, rash and wound.  Neurological:  Positive for headaches. Negative for dizziness, tremors, weakness and light-headedness.  Hematological:  Negative for adenopathy. Does not bruise/bleed easily.  Psychiatric/Behavioral:  Negative for agitation, behavioral problems, confusion, decreased concentration, dysphoric mood and sleep disturbance.        Objective:   Physical Exam Constitutional:      General: She is not in acute distress.    Appearance: Normal appearance. She is well-developed. She is not ill-appearing or diaphoretic.  HENT:     Head: Normocephalic and atraumatic.     Right Ear: Hearing and external ear normal.     Left Ear: Hearing and external ear normal.     Nose: No nasal deformity or rhinorrhea.  Eyes:     General: No scleral icterus.    Conjunctiva/sclera: Conjunctivae normal.     Right eye: Right conjunctiva is not injected.     Left eye: Left conjunctiva is not injected.     Pupils: Pupils are equal, round, and reactive to light.  Neck:     Vascular: No JVD.  Cardiovascular:     Rate and Rhythm: Normal rate and regular rhythm.     Heart sounds: S1 normal and S2 normal.   Pulmonary:     Effort: Pulmonary effort is normal. No respiratory distress.  Breath sounds: No wheezing.  Abdominal:     General: There is no distension.     Palpations: Abdomen is soft.  Musculoskeletal:        General: Normal range of motion.     Right shoulder: Normal.     Left shoulder: Normal.     Cervical back: Normal range of motion and neck supple.     Right hip: Normal. No deformity, lacerations, tenderness, bony tenderness or crepitus. Normal range of motion. Normal strength.     Left hip: Normal. No deformity, lacerations, tenderness or bony tenderness. Normal range of motion. Normal strength.     Right knee: Normal.     Left knee: Normal.  Lymphadenopathy:     Head:     Right side of head: No submandibular, preauricular or posterior auricular adenopathy.     Left side of head: No submandibular, preauricular or posterior auricular adenopathy.     Cervical: No cervical adenopathy.     Right cervical: No superficial or deep cervical adenopathy.    Left cervical: No superficial or deep cervical adenopathy.  Skin:    General: Skin is warm and dry.     Coloration: Skin is not pale.     Findings: No abrasion, bruising, ecchymosis, erythema, lesion or rash.     Nails: There is no clubbing.  Neurological:     Mental Status: She is alert and oriented to person, place, and time.     Sensory: No sensory deficit.     Coordination: Coordination normal.     Gait: Gait normal.  Psychiatric:        Attention and Perception: She is attentive.        Speech: Speech normal.        Behavior: Behavior normal. Behavior is cooperative.        Thought Content: Thought content normal.        Judgment: Judgment normal.     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  I would like to switch her back to cefazolin due to its preferable  safety profile as well as its more bactericidal activity than vancomycin.  It does not penetrate the CNS as nafcillin and vancomycin does but I am hoping she is not in need of further antibiotics to penetrate into the CNS at this point in time though that may not be true and we may have to change gears again.  I will have her see my partner Dr. Juleen China next week.  I am also on-call this weekend certainly and during the week after 5 if there are issues that arise.  When she has completed the cefazolin I would like to change her over to oral cefadroxil and give her protracted oral therapy given my concerns that her hardware could be infected despite the fact that it did not look overtly infected infected in the operating room.  I have seen many other patients with there is not any overt purulence in the operating room but the patient does have infection.  Headaches: They appear different than her migraine headaches and different than the CSF leak.  I am hopeful that these are not due to a CNS infection and certainly if they persist I would get an MRI of the brain with and without contrast.  She will need protonic  premedication preferably with lithium since she did not tolerate Ativan.  She also needed medications an hour before says she is sufficiently premedicated to undergo the MRI due to her significant claustrophobia.  Tingling in tongue as well as pruritus and myalgias:  I do not think these are due to an adverse drug reaction.  We will see how she does with rechallenge with cefazolin.  She will see my partner Dr. Juleen China next week.  Hypertension: Really should not be related the antibiotics given they are not giving her much of a salt load.  Expect some of this is coming from anxiety.  I spent 42 minutes with the patient including time in face to face counseling of the patient and her husband regarding the above problems along with review of medical records in preparation for the visit  and during the visit and in coordination of her care.

## 2022-11-01 DIAGNOSIS — Z5181 Encounter for therapeutic drug level monitoring: Secondary | ICD-10-CM | POA: Diagnosis not present

## 2022-11-01 DIAGNOSIS — F32A Depression, unspecified: Secondary | ICD-10-CM | POA: Diagnosis not present

## 2022-11-01 DIAGNOSIS — Z9181 History of falling: Secondary | ICD-10-CM | POA: Diagnosis not present

## 2022-11-01 DIAGNOSIS — G43019 Migraine without aura, intractable, without status migrainosus: Secondary | ICD-10-CM | POA: Diagnosis not present

## 2022-11-01 DIAGNOSIS — A4101 Sepsis due to Methicillin susceptible Staphylococcus aureus: Secondary | ICD-10-CM | POA: Diagnosis not present

## 2022-11-01 DIAGNOSIS — G9340 Encephalopathy, unspecified: Secondary | ICD-10-CM | POA: Diagnosis not present

## 2022-11-01 DIAGNOSIS — G003 Staphylococcal meningitis: Secondary | ICD-10-CM | POA: Diagnosis not present

## 2022-11-01 DIAGNOSIS — G43819 Other migraine, intractable, without status migrainosus: Secondary | ICD-10-CM | POA: Diagnosis not present

## 2022-11-01 DIAGNOSIS — Z452 Encounter for adjustment and management of vascular access device: Secondary | ICD-10-CM | POA: Diagnosis not present

## 2022-11-01 DIAGNOSIS — Z792 Long term (current) use of antibiotics: Secondary | ICD-10-CM | POA: Diagnosis not present

## 2022-11-01 DIAGNOSIS — T8143XA Infection following a procedure, organ and space surgical site, initial encounter: Secondary | ICD-10-CM | POA: Diagnosis not present

## 2022-11-01 DIAGNOSIS — G2581 Restless legs syndrome: Secondary | ICD-10-CM | POA: Diagnosis not present

## 2022-11-01 DIAGNOSIS — T8144XA Sepsis following a procedure, initial encounter: Secondary | ICD-10-CM | POA: Diagnosis not present

## 2022-11-01 DIAGNOSIS — Z981 Arthrodesis status: Secondary | ICD-10-CM | POA: Diagnosis not present

## 2022-11-01 DIAGNOSIS — G9609 Other spinal cerebrospinal fluid leak: Secondary | ICD-10-CM | POA: Diagnosis not present

## 2022-11-03 ENCOUNTER — Ambulatory Visit: Payer: Medicare Other | Admitting: Internal Medicine

## 2022-11-03 ENCOUNTER — Encounter: Payer: Self-pay | Admitting: Internal Medicine

## 2022-11-03 ENCOUNTER — Telehealth: Payer: Self-pay

## 2022-11-03 ENCOUNTER — Other Ambulatory Visit: Payer: Self-pay

## 2022-11-03 VITALS — BP 154/93 | HR 96 | Temp 97.9°F | Ht 67.0 in | Wt 169.0 lb

## 2022-11-03 DIAGNOSIS — R7881 Bacteremia: Secondary | ICD-10-CM | POA: Diagnosis not present

## 2022-11-03 DIAGNOSIS — G003 Staphylococcal meningitis: Secondary | ICD-10-CM

## 2022-11-03 DIAGNOSIS — B9561 Methicillin susceptible Staphylococcus aureus infection as the cause of diseases classified elsewhere: Secondary | ICD-10-CM | POA: Diagnosis not present

## 2022-11-03 LAB — LAB REPORT - SCANNED: EGFR: 91

## 2022-11-03 MED ORDER — DOXYCYCLINE HYCLATE 100 MG PO TABS: 100.0000 mg | ORAL_TABLET | Freq: Two times a day (BID) | ORAL | 2 refills | Status: DC

## 2022-11-03 NOTE — Telephone Encounter (Signed)
Per Dr. Earlene Plater, okay to pull PICC after last dose on 11/12/22. Orders sent to Jeri Modena, RN with [employer].  Sandie Ano, RN

## 2022-11-03 NOTE — Progress Notes (Signed)
[facility]  CHIEF COMPLAINT:    Follow up for MSSA spinal infection and bacteremia  SUBJECTIVE:    Amanda [name] is a 62 y.o. female with PMHx as below who presents to the clinic for MSSA spinal infection and bacteremia.   Patient underwent microdiscectomy that was complicated by infection with MSSA bacteremia and meningitis.  She underwent CSF leak repair and placement of hardware subsequently.  She completed 2 weeks of Nafcillin and then switched over to cefazolin.  However, she did not tolerate this and was switched over to vancomycin.  She was seen 10/27/22 by Dr Daiva Eves and switched back over to cefazolin and scheduled for follow up today.  Howver, over the weekend she was switched back again to vancomycin due to cefazolin intolerance.  She has been feeling better again since going back to vancomycin.   Please see A&P for the details of today's visit and status of the patient's medical problems.   Patient's Medications  New Prescriptions   DOXYCYCLINE (VIBRA-TABS) 100 MG TABLET    Take 1 tablet (100 mg total) by mouth 2 (two) times daily.  Previous Medications   ASPIRIN-ACETAMINOPHEN-CAFFEINE (EXCEDRIN MIGRAINE) 250-250-65 MG TABLET    Take 2 tablets by mouth every 6 (six) hours as needed for headache or migraine.   CALCIUM CARBONATE-VIT D-MIN (CALCIUM 1200 PO)    Take 1,200 mg by mouth 2 (two) times daily.    CHOLECALCIFEROL (VITAMIN D3) 25 MCG (1000 UNIT) TABLET    Take 1,000 Units by mouth daily.   CLONAZEPAM (KLONOPIN) 1 MG TABLET    TAKE ONE TABLET BY MOUTH TWICE A DAY MAY TAKE ADDITIONAL TABLET IF NEEDED   DIPHENOXYLATE-ATROPINE (LOMOTIL) 2.5-0.025 MG TABLET    Take 1 tablet by mouth 2 (two) times daily.   ELETRIPTAN (RELPAX) 40 MG TABLET    TAKE ONE TABLET BY MOUTH AT ONSET OF HEADACHE; MAY REPEAT ONE TABLET IN 2 HOURS IF NEEDED.   ESOMEPRAZOLE (NEXIUM) 20 MG CAPSULE    Take 20 mg by mouth daily at 12 noon.   GABAPENTIN (NEURONTIN) 300  MG CAPSULE    TAKE THREE CAPSULES BY MOUTH EVERY NIGHT AT BEDTIME   LINZESS 290 MCG CAPS CAPSULE    Take 290 mcg by mouth daily.   MAGNESIUM 400 MG CAPS    Take 400 mg by mouth 2 (two) times daily.    ONDANSETRON (ZOFRAN) 8 MG TABLET    TAKE 1 TABLET BY MOUTH EVERY 8 HOURS AS NEEDED FOR NAUSEA AND VOMITING   OXYCODONE 10 MG TABS    Take 1 tablet (10 mg total) by mouth every 4 (four) hours as needed for severe pain ((score 7 to 10)).   POTASSIUM CHLORIDE SA (KLOR-CON M) 20 MEQ TABLET    Take 1 tablet (20 mEq total) by mouth daily as needed (if more than 3 loose stools).   PROMETHAZINE (PHENERGAN) 12.5 MG TABLET    Take 12.5 mg by mouth every 6 (six) hours as needed for vomiting or nausea.   SACCHAROMYCES BOULARDII (FLORASTOR) 250 MG CAPSULE    Take 1 capsule (250 mg total) by mouth 2 (two) times daily.   VANCOMYCIN (VANCOCIN) 10 G SOLR INJECTION    Inject into the vein.   VENLAFAXINE XR (EFFEXOR-XR) 150 MG 24 HR CAPSULE    Take with the 75 mg = 225 mg daily.   VENLAFAXINE XR (EFFEXOR-XR) 75 MG 24 HR CAPSULE    Take 1 capsule (  75 mg total) by mouth daily with breakfast. Does take with 150mg   Modified Medications   No medications on file  Discontinued Medications   No medications on file      Past Medical History:  Diagnosis Date   Allergic reaction 10/27/2022   Altered mental status 04/19/2016   Depression    History of total hysterectomy    Hyperglycemia 04/19/2016   Intractable migraine without aura 12/04/2012   Migraine with intractable migraine    PONV (postoperative nausea and vomiting)    Restless leg syndrome     Social History   Tobacco Use   Smoking status: Never   Smokeless tobacco: Never  Substance Use Topics   Alcohol use: Yes    Alcohol/week: 1.0 standard drink of alcohol    Types: 1 Glasses of wine per week    Comment: one a year   Drug use: No    Family History  Problem Relation Age of Onset   Dementia Mother    Lung cancer Father    Migraines Sister     Migraines Sister    Migraines Brother    Hypothyroidism Sister    Hypothyroidism Sister    Hypothyroidism Sister     Allergies  Allergen Reactions   Cephalexin Diarrhea, Itching and Swelling   Metoclopramide Other (See Comments)    Irritates the restless legs   Wellbutrin [Bupropion]     GERD   Aspirin Other (See Comments)    Makes her ears ring    ROS   OBJECTIVE:    Vitals:   11/03/22 1612  BP: (!) 154/93  Pulse: 96  Temp: 97.9 F (36.6 C)  TempSrc: Temporal  SpO2: 98%  Weight: 169 lb (76.7 kg)  Height: 5\' 7"  (1.702 m)   Body mass index is 26.47 kg/m.  Physical Exam   Labs and Microbiology:    Latest Ref Rng & Units 10/09/2022    4:31 AM 10/08/2022    4:17 AM 10/07/2022    3:17 AM  CBC  WBC 4.0 - 10.5 K/uL 10.1  10.6  14.5   Hemoglobin 12.0 - 15.0 g/dL 9.4  9.6  39.0   Hematocrit 36.0 - 46.0 % 30.3  29.7  30.3   Platelets 150 - 400 K/uL 529  478  491       Latest Ref Rng & Units 10/09/2022    4:31 AM 10/08/2022    4:17 AM 10/07/2022    3:17 AM  CMP  Glucose 70 - 99 mg/dL 300  923  91   BUN 8 - 23 mg/dL 6  6  5    Creatinine 0.44 - 1.00 mg/dL 3.00  7.62  2.63   Sodium 135 - 145 mmol/L 137  133  136   Potassium 3.5 - 5.1 mmol/L 3.4  3.4  2.9   Chloride 98 - 111 mmol/L 101  100  97   CO2 22 - 32 mmol/L 25  23  25    Calcium 8.9 - 10.3 mg/dL 8.7  8.6  8.8   Total Protein 6.5 - 8.1 g/dL   6.6   Total Bilirubin 0.3 - 1.2 mg/dL   1.7   Alkaline Phos 38 - 126 U/L   126   AST 15 - 41 U/L   16   ALT 0 - 44 U/L   11       ASSESSMENT & PLAN:    MSSA bacteremia Patient will complete her IV antibiotic course on 11/12/22 and then switch to PO  doxycycline 100mg  BID for suppressive therapy.  Duration TBD but potentially indefinitely.  She will have PICC line removed following IV therapy and prescription for doxycycline sent to her pharmacy.  Follow up planned for 12/09/22.       Vedia CofferAndrew N Rondey Fallen [facility] 11/05/2022, 12:09 PM

## 2022-11-03 NOTE — Patient Instructions (Addendum)
Thank you for coming to see me today. It was a pleasure seeing you.  To Do: Continue IV antibiotics until 11/12/22 then PICC line to be removed After finishing IV therapy, begin oral antibiotics with Doxycycline 100mg  twice daily I sent that prescription to your pharmacy Follow up in 6 weeks  If you have any questions or concerns, please do not hesitate to call the office at [phone].  Take Care,   Gwynn Burly

## 2022-11-04 DIAGNOSIS — F32A Depression, unspecified: Secondary | ICD-10-CM | POA: Diagnosis not present

## 2022-11-04 DIAGNOSIS — G003 Staphylococcal meningitis: Secondary | ICD-10-CM | POA: Diagnosis not present

## 2022-11-04 DIAGNOSIS — G2581 Restless legs syndrome: Secondary | ICD-10-CM | POA: Diagnosis not present

## 2022-11-04 DIAGNOSIS — Z981 Arthrodesis status: Secondary | ICD-10-CM | POA: Diagnosis not present

## 2022-11-04 DIAGNOSIS — Z9181 History of falling: Secondary | ICD-10-CM | POA: Diagnosis not present

## 2022-11-04 DIAGNOSIS — G43819 Other migraine, intractable, without status migrainosus: Secondary | ICD-10-CM | POA: Diagnosis not present

## 2022-11-04 DIAGNOSIS — G43019 Migraine without aura, intractable, without status migrainosus: Secondary | ICD-10-CM | POA: Diagnosis not present

## 2022-11-04 DIAGNOSIS — T8143XA Infection following a procedure, organ and space surgical site, initial encounter: Secondary | ICD-10-CM | POA: Diagnosis not present

## 2022-11-04 DIAGNOSIS — G9609 Other spinal cerebrospinal fluid leak: Secondary | ICD-10-CM | POA: Diagnosis not present

## 2022-11-04 DIAGNOSIS — T8144XA Sepsis following a procedure, initial encounter: Secondary | ICD-10-CM | POA: Diagnosis not present

## 2022-11-04 DIAGNOSIS — G9340 Encephalopathy, unspecified: Secondary | ICD-10-CM | POA: Diagnosis not present

## 2022-11-04 DIAGNOSIS — Z792 Long term (current) use of antibiotics: Secondary | ICD-10-CM | POA: Diagnosis not present

## 2022-11-04 DIAGNOSIS — Z452 Encounter for adjustment and management of vascular access device: Secondary | ICD-10-CM | POA: Diagnosis not present

## 2022-11-04 DIAGNOSIS — A4101 Sepsis due to Methicillin susceptible Staphylococcus aureus: Secondary | ICD-10-CM | POA: Diagnosis not present

## 2022-11-05 NOTE — Assessment & Plan Note (Signed)
Patient will complete her IV antibiotic course on 11/12/22 and then switch to PO doxycycline 100mg  BID for suppressive therapy.  Duration TBD but potentially indefinitely.  She will have PICC line removed following IV therapy and prescription for doxycycline sent to her pharmacy.  Follow up planned for 12/09/22.

## 2022-11-08 DIAGNOSIS — Z792 Long term (current) use of antibiotics: Secondary | ICD-10-CM | POA: Diagnosis not present

## 2022-11-08 DIAGNOSIS — F32A Depression, unspecified: Secondary | ICD-10-CM | POA: Diagnosis not present

## 2022-11-08 DIAGNOSIS — A4101 Sepsis due to Methicillin susceptible Staphylococcus aureus: Secondary | ICD-10-CM | POA: Diagnosis not present

## 2022-11-08 DIAGNOSIS — G2581 Restless legs syndrome: Secondary | ICD-10-CM | POA: Diagnosis not present

## 2022-11-08 DIAGNOSIS — T8143XA Infection following a procedure, organ and space surgical site, initial encounter: Secondary | ICD-10-CM | POA: Diagnosis not present

## 2022-11-08 DIAGNOSIS — G9609 Other spinal cerebrospinal fluid leak: Secondary | ICD-10-CM | POA: Diagnosis not present

## 2022-11-08 DIAGNOSIS — G9340 Encephalopathy, unspecified: Secondary | ICD-10-CM | POA: Diagnosis not present

## 2022-11-08 DIAGNOSIS — G003 Staphylococcal meningitis: Secondary | ICD-10-CM | POA: Diagnosis not present

## 2022-11-08 DIAGNOSIS — Z981 Arthrodesis status: Secondary | ICD-10-CM | POA: Diagnosis not present

## 2022-11-08 DIAGNOSIS — Z452 Encounter for adjustment and management of vascular access device: Secondary | ICD-10-CM | POA: Diagnosis not present

## 2022-11-08 DIAGNOSIS — T8144XA Sepsis following a procedure, initial encounter: Secondary | ICD-10-CM | POA: Diagnosis not present

## 2022-11-08 DIAGNOSIS — Z9181 History of falling: Secondary | ICD-10-CM | POA: Diagnosis not present

## 2022-11-08 DIAGNOSIS — G43819 Other migraine, intractable, without status migrainosus: Secondary | ICD-10-CM | POA: Diagnosis not present

## 2022-11-08 DIAGNOSIS — G43019 Migraine without aura, intractable, without status migrainosus: Secondary | ICD-10-CM | POA: Diagnosis not present

## 2022-11-09 ENCOUNTER — Ambulatory Visit: Payer: Medicare Other | Admitting: Family Medicine

## 2022-11-09 ENCOUNTER — Encounter: Payer: Self-pay | Admitting: Infectious Disease

## 2022-11-09 LAB — LAB REPORT - SCANNED: EGFR: 58

## 2022-11-10 DIAGNOSIS — R7881 Bacteremia: Secondary | ICD-10-CM | POA: Diagnosis not present

## 2022-11-11 DIAGNOSIS — Z9181 History of falling: Secondary | ICD-10-CM | POA: Diagnosis not present

## 2022-11-11 DIAGNOSIS — T8144XA Sepsis following a procedure, initial encounter: Secondary | ICD-10-CM | POA: Diagnosis not present

## 2022-11-11 DIAGNOSIS — Z981 Arthrodesis status: Secondary | ICD-10-CM | POA: Diagnosis not present

## 2022-11-11 DIAGNOSIS — G43819 Other migraine, intractable, without status migrainosus: Secondary | ICD-10-CM | POA: Diagnosis not present

## 2022-11-11 DIAGNOSIS — G9609 Other spinal cerebrospinal fluid leak: Secondary | ICD-10-CM | POA: Diagnosis not present

## 2022-11-11 DIAGNOSIS — G9340 Encephalopathy, unspecified: Secondary | ICD-10-CM | POA: Diagnosis not present

## 2022-11-11 DIAGNOSIS — G43019 Migraine without aura, intractable, without status migrainosus: Secondary | ICD-10-CM | POA: Diagnosis not present

## 2022-11-11 DIAGNOSIS — T8143XA Infection following a procedure, organ and space surgical site, initial encounter: Secondary | ICD-10-CM | POA: Diagnosis not present

## 2022-11-11 DIAGNOSIS — A4101 Sepsis due to Methicillin susceptible Staphylococcus aureus: Secondary | ICD-10-CM | POA: Diagnosis not present

## 2022-11-11 DIAGNOSIS — G003 Staphylococcal meningitis: Secondary | ICD-10-CM | POA: Diagnosis not present

## 2022-11-11 DIAGNOSIS — F32A Depression, unspecified: Secondary | ICD-10-CM | POA: Diagnosis not present

## 2022-11-11 DIAGNOSIS — Z452 Encounter for adjustment and management of vascular access device: Secondary | ICD-10-CM | POA: Diagnosis not present

## 2022-11-11 DIAGNOSIS — Z792 Long term (current) use of antibiotics: Secondary | ICD-10-CM | POA: Diagnosis not present

## 2022-11-11 DIAGNOSIS — G2581 Restless legs syndrome: Secondary | ICD-10-CM | POA: Diagnosis not present

## 2022-11-11 DIAGNOSIS — Z5181 Encounter for therapeutic drug level monitoring: Secondary | ICD-10-CM | POA: Diagnosis not present

## 2022-11-12 DIAGNOSIS — T8143XA Infection following a procedure, organ and space surgical site, initial encounter: Secondary | ICD-10-CM | POA: Diagnosis not present

## 2022-11-12 DIAGNOSIS — G2581 Restless legs syndrome: Secondary | ICD-10-CM | POA: Diagnosis not present

## 2022-11-12 DIAGNOSIS — Z981 Arthrodesis status: Secondary | ICD-10-CM | POA: Diagnosis not present

## 2022-11-12 DIAGNOSIS — Z792 Long term (current) use of antibiotics: Secondary | ICD-10-CM | POA: Diagnosis not present

## 2022-11-12 DIAGNOSIS — G43819 Other migraine, intractable, without status migrainosus: Secondary | ICD-10-CM | POA: Diagnosis not present

## 2022-11-12 DIAGNOSIS — T8144XA Sepsis following a procedure, initial encounter: Secondary | ICD-10-CM | POA: Diagnosis not present

## 2022-11-12 DIAGNOSIS — Z452 Encounter for adjustment and management of vascular access device: Secondary | ICD-10-CM | POA: Diagnosis not present

## 2022-11-12 DIAGNOSIS — Z9181 History of falling: Secondary | ICD-10-CM | POA: Diagnosis not present

## 2022-11-12 DIAGNOSIS — G9340 Encephalopathy, unspecified: Secondary | ICD-10-CM | POA: Diagnosis not present

## 2022-11-12 DIAGNOSIS — G43019 Migraine without aura, intractable, without status migrainosus: Secondary | ICD-10-CM | POA: Diagnosis not present

## 2022-11-12 DIAGNOSIS — G003 Staphylococcal meningitis: Secondary | ICD-10-CM | POA: Diagnosis not present

## 2022-11-12 DIAGNOSIS — A4101 Sepsis due to Methicillin susceptible Staphylococcus aureus: Secondary | ICD-10-CM | POA: Diagnosis not present

## 2022-11-12 DIAGNOSIS — G9609 Other spinal cerebrospinal fluid leak: Secondary | ICD-10-CM | POA: Diagnosis not present

## 2022-11-12 DIAGNOSIS — F32A Depression, unspecified: Secondary | ICD-10-CM | POA: Diagnosis not present

## 2022-11-12 LAB — LAB REPORT - SCANNED: EGFR: 76

## 2022-11-15 DIAGNOSIS — G9340 Encephalopathy, unspecified: Secondary | ICD-10-CM | POA: Diagnosis not present

## 2022-11-15 DIAGNOSIS — G9609 Other spinal cerebrospinal fluid leak: Secondary | ICD-10-CM | POA: Diagnosis not present

## 2022-11-15 DIAGNOSIS — G43819 Other migraine, intractable, without status migrainosus: Secondary | ICD-10-CM | POA: Diagnosis not present

## 2022-11-15 DIAGNOSIS — G43019 Migraine without aura, intractable, without status migrainosus: Secondary | ICD-10-CM | POA: Diagnosis not present

## 2022-11-15 DIAGNOSIS — Z981 Arthrodesis status: Secondary | ICD-10-CM | POA: Diagnosis not present

## 2022-11-15 DIAGNOSIS — A4101 Sepsis due to Methicillin susceptible Staphylococcus aureus: Secondary | ICD-10-CM | POA: Diagnosis not present

## 2022-11-15 DIAGNOSIS — G003 Staphylococcal meningitis: Secondary | ICD-10-CM | POA: Diagnosis not present

## 2022-11-15 DIAGNOSIS — G2581 Restless legs syndrome: Secondary | ICD-10-CM | POA: Diagnosis not present

## 2022-11-15 DIAGNOSIS — Z792 Long term (current) use of antibiotics: Secondary | ICD-10-CM | POA: Diagnosis not present

## 2022-11-15 DIAGNOSIS — F32A Depression, unspecified: Secondary | ICD-10-CM | POA: Diagnosis not present

## 2022-11-15 DIAGNOSIS — Z452 Encounter for adjustment and management of vascular access device: Secondary | ICD-10-CM | POA: Diagnosis not present

## 2022-11-15 DIAGNOSIS — T8144XA Sepsis following a procedure, initial encounter: Secondary | ICD-10-CM | POA: Diagnosis not present

## 2022-11-15 DIAGNOSIS — Z9181 History of falling: Secondary | ICD-10-CM | POA: Diagnosis not present

## 2022-11-15 DIAGNOSIS — T8143XA Infection following a procedure, organ and space surgical site, initial encounter: Secondary | ICD-10-CM | POA: Diagnosis not present

## 2022-11-16 ENCOUNTER — Other Ambulatory Visit: Payer: Self-pay | Admitting: Internal Medicine

## 2022-11-16 ENCOUNTER — Telehealth: Payer: Self-pay

## 2022-11-16 DIAGNOSIS — M5126 Other intervertebral disc displacement, lumbar region: Secondary | ICD-10-CM | POA: Diagnosis not present

## 2022-11-16 DIAGNOSIS — R7881 Bacteremia: Secondary | ICD-10-CM | POA: Diagnosis not present

## 2022-11-16 DIAGNOSIS — F3341 Major depressive disorder, recurrent, in partial remission: Secondary | ICD-10-CM | POA: Diagnosis not present

## 2022-11-16 DIAGNOSIS — B9561 Methicillin susceptible Staphylococcus aureus infection as the cause of diseases classified elsewhere: Secondary | ICD-10-CM | POA: Diagnosis not present

## 2022-11-16 MED ORDER — PROMETHAZINE HCL 12.5 MG PO TABS: 12.5000 mg | ORAL_TABLET | Freq: Four times a day (QID) | ORAL | 0 refills | Status: DC | PRN

## 2022-11-16 NOTE — Telephone Encounter (Signed)
Patient aware of prescription sent to pharmacy.

## 2022-11-16 NOTE — Telephone Encounter (Signed)
Received a call from patient stating that since she has been taking the doxycyline she has been sick with vomiting and nausea. She would like a prescription for phenergan to be sent to [facility].

## 2022-11-22 ENCOUNTER — Other Ambulatory Visit: Payer: Self-pay | Admitting: Internal Medicine

## 2022-11-22 MED ORDER — PROMETHAZINE HCL 25 MG PO TABS: 25.0000 mg | ORAL_TABLET | Freq: Four times a day (QID) | ORAL | 1 refills | Status: DC | PRN

## 2022-11-22 NOTE — Telephone Encounter (Signed)
Patient called stating that she is still vomiting after taking doxycycline. Patient said that she usually takes 25 mg and 12.5 mg was called in. Patient has doubled up on 12.5 mg dose but could we send in a new RX for 25 mg?

## 2022-11-24 ENCOUNTER — Ambulatory Visit: Payer: Medicare Other | Admitting: Psychiatry

## 2022-12-02 ENCOUNTER — Telehealth: Payer: Self-pay | Admitting: Pharmacist

## 2022-12-02 ENCOUNTER — Other Ambulatory Visit (HOSPITAL_COMMUNITY): Payer: Self-pay

## 2022-12-02 DIAGNOSIS — R110 Nausea: Secondary | ICD-10-CM

## 2022-12-02 MED ORDER — ONDANSETRON HCL 8 MG PO TABS
8.0000 mg | ORAL_TABLET | Freq: Three times a day (TID) | ORAL | 2 refills | Status: DC | PRN
Start: 2022-12-02 — End: 2023-01-19

## 2022-12-02 NOTE — Telephone Encounter (Signed)
I was thinking Augmentin may be a good option to try. Want me to make the switch?

## 2022-12-02 NOTE — Telephone Encounter (Signed)
Amanda [name] called today with problems taking her doxycycline. She states that she is still having nausea and vomiting around the clock no matter what she does. She has been taking phenergan before bedtime and it allows her to sleep through the night but that she is miserable throughout the day, unable to do anything because of the way she feels. It does not matter if she takes it with or without food but that it is slightly better when she eats meals with more protein. She previously took zofran that helped some as well. She is wondering what else can be done.   I advised her to make sure to eat a meal when she takes the doxycycline. Also advised her to drink lots of water and to make sure she sits upright for ~30-60 minutes after taking or it can cause nausea and esophagus irritation. She was not doing this and will start. I asked her to start rotating the zofran and phenergan throughout the day. I advised her to take the zofran first thing when she wakes up and every 6-8 hours and to take the phenergan in between the zofran, basically always having an antiemetic on board. I will send in more zofran refills for her. She is willing to try this and tough it out until she sees Dr. Earlene Plater next week.   I told her I would discuss other options with Drs. Earlene Plater and [employer]. She has traditionally not tolerated cephalosporins so cefadroxil seems to be out of the question but may be worth challenging to see if she can tolerate. Other potential options are not the best long term (bactrim, linezolid/tedizolid, etc). Will discuss and get back to her with recommendations.   Amanda [name], PharmD, BCIDP, AAHIVP, CPP Clinical Pharmacist Practitioner Infectious Diseases Clinical Pharmacist [facility] 12/02/2022, 2:58 PM

## 2022-12-06 ENCOUNTER — Telehealth: Payer: Self-pay | Admitting: Pharmacist

## 2022-12-06 NOTE — Telephone Encounter (Addendum)
Called patient to discuss how she is tolerating her doxycycline with the zofran and phenergan. Left the patient a message to call back to see if she is willing to continue doxycycline if nausea/vomiting controlled prior to her visit on Thursday. Thank you.  Arabella Merles, PharmD. [facility] Acute Care PGY-1  12/06/2022 10:11 AM    Was able to speak with the patient and she is tolerating the doxycycline with the addition of zofran and phenergan. She still has periods of nausea, but no episodes of vomiting and is fine to continue the medication until she can follow up with Dr. Earlene Plater. She just picked up a refill and all questions patient had were answered. Thank you.  Arabella Merles, PharmD. [facility] Acute Care PGY-1  12/07/2022 12:00 PM

## 2022-12-09 ENCOUNTER — Ambulatory Visit: Payer: Medicare Other | Admitting: Internal Medicine

## 2022-12-09 ENCOUNTER — Other Ambulatory Visit: Payer: Self-pay | Admitting: Internal Medicine

## 2022-12-10 ENCOUNTER — Telehealth: Payer: Self-pay

## 2022-12-10 DIAGNOSIS — R110 Nausea: Secondary | ICD-10-CM

## 2022-12-10 NOTE — Telephone Encounter (Signed)
Called patient to reschedule appointment with Dr. Earlene Plater; is scheduled for follow up on 6/5.  Patient is requesting refills on phenergan. Is still experiencing nausea with doxycyline. Also mentioned during call that she is having issues taking doxy with recent migraines. Migraines have been getting worse due to vomiting.  Has history of Migraines, but has had them more frequent.  Would like to inform provider. Juanita Laster, RMA

## 2022-12-10 NOTE — Telephone Encounter (Signed)
Thanks.  I can refill the Phenergan, but there was also discussion with pharmacy last week about switching her to Augmentin to see if this would be better tolerated.    Would she prefer to switch to Augmentin or continue Doxycycline with antiemetics?  Either option is fine with me as long as she continues taking antibiotics consistently.   If she wants to wait until 6/5 to discuss further that is okay, too.  I'll just need to know which medication to order for now.  [name]

## 2022-12-21 ENCOUNTER — Encounter: Payer: Self-pay | Admitting: Physician Assistant

## 2022-12-21 ENCOUNTER — Ambulatory Visit: Payer: Medicare Other | Admitting: Physician Assistant

## 2022-12-21 DIAGNOSIS — Z62820 Parent-biological child conflict: Secondary | ICD-10-CM

## 2022-12-21 DIAGNOSIS — G43909 Migraine, unspecified, not intractable, without status migrainosus: Secondary | ICD-10-CM

## 2022-12-21 DIAGNOSIS — F331 Major depressive disorder, recurrent, moderate: Secondary | ICD-10-CM

## 2022-12-21 DIAGNOSIS — Z630 Problems in relationship with spouse or partner: Secondary | ICD-10-CM

## 2022-12-21 DIAGNOSIS — F411 Generalized anxiety disorder: Secondary | ICD-10-CM

## 2022-12-21 DIAGNOSIS — F439 Reaction to severe stress, unspecified: Secondary | ICD-10-CM | POA: Diagnosis not present

## 2022-12-21 MED ORDER — VENLAFAXINE HCL ER 150 MG PO CP24: 300.0000 mg | ORAL_CAPSULE | Freq: Every day | ORAL | 11 refills | Status: DC

## 2022-12-21 NOTE — Progress Notes (Signed)
[facility]  Patient ID: Amanda [name],  MRN: [id number]  PCP: Amanda Brunette, MD  Date of Evaluation: 12/21/2022 Time spent:20 minutes  Chief Complaint:  Chief Complaint   Depression; Anxiety    HISTORY/CURRENT STATUS: HPI  For routine med check.  Under a lot of stress. Her MIL is dying from lung CA w/ brain mets. Her husband isn't talking with pt much. Her 2 daughters won't speak to her.  She does talk with her sister occasionally, but does not talk with anyone else.  Her dog gives her a lot of comfort.  Her husband is going on disability but it's not enough money. He'll have to get a part-time job. He's verbally abusive. Is very controlling.  He goes to see his Mother every other day.  She's offered to go, give him a break but he doesn't want her to go. "I think he's afraid I'll say something he doesn't want me to."  She's unable to work d/t physical and mental issues.  She feels hopeless.  Does not do anything, nothing to look forward to.  Appetite is normal and weight is stable.  ADLs are limited depending on if she has a migraine or not.  They still occur several times a week.  When they do happen she does the minimum that is necessary in the house.  Personal hygiene is normal.  She sleeps well.  She stays in her room a lot.  No suicidal or homicidal thoughts.  She gets overwhelmed with all that is going on.  Does not have panic attacks, but feels on edge a lot.  The Klonopin is helpful.  Patient denies increased energy with decreased need for sleep, increased talkativeness, racing thoughts, impulsivity or risky behaviors, increased spending, increased libido, grandiosity, increased irritability or anger, paranoia, or hallucinations.  Denies dizziness, syncope, seizures, numbness, tingling, tremor, tics, unsteady gait, slurred speech, confusion. Denies muscle or joint pain, stiffness, or dystonia.  Individual Medical History/ Review of Systems: Changes? :Yes     continues to take oral doxycycline for another couple of months.  She had sepsis back in March and is still recovering.  Past medications for mental health diagnoses include: Effexor, Wellbutrin, Prozac caused weight loss and GI, Relpax, Klonopin  Allergies: Cephalexin, Metoclopramide, Wellbutrin [bupropion], and Aspirin  Current Medications:  Current Outpatient Medications:    aspirin-acetaminophen-caffeine (EXCEDRIN MIGRAINE) 250-250-65 MG tablet, Take 2 tablets by mouth every 6 (six) hours as needed for headache or migraine., Disp: , Rfl:    Calcium Carbonate-Vit D-Min (CALCIUM 1200 PO), Take 1,200 mg by mouth 2 (two) times daily. , Disp: , Rfl:    cholecalciferol (VITAMIN D3) 25 MCG (1000 UNIT) tablet, Take 1,000 Units by mouth daily., Disp: , Rfl:    clonazePAM (KLONOPIN) 1 MG tablet, TAKE ONE TABLET BY MOUTH TWICE A DAY MAY TAKE ADDITIONAL TABLET IF NEEDED, Disp: 75 tablet, Rfl: 5   diphenoxylate-atropine (LOMOTIL) 2.5-0.025 MG tablet, Take 1 tablet by mouth 2 (two) times daily., Disp: 30 tablet, Rfl: 0   doxycycline (VIBRA-TABS) 100 MG tablet, Take 1 tablet (100 mg total) by mouth 2 (two) times daily., Disp: 60 tablet, Rfl: 2   eletriptan (RELPAX) 40 MG tablet, TAKE ONE TABLET BY MOUTH AT ONSET OF HEADACHE; MAY REPEAT ONE TABLET IN 2 HOURS IF NEEDED. (Patient taking differently: Take 40 mg by mouth every 2 (two) hours as needed for headache.), Disp: 10 tablet, Rfl: 11   esomeprazole (NEXIUM) 20 MG capsule, Take 20 mg by mouth daily  at 12 noon., Disp: , Rfl:    gabapentin (NEURONTIN) 300 MG capsule, TAKE THREE CAPSULES BY MOUTH EVERY NIGHT AT BEDTIME (Patient taking differently: 900 mg at bedtime. TAKE THREE CAPSULES BY MOUTH EVERY NIGHT AT BEDTIME), Disp: 270 capsule, Rfl: 1   LINZESS 290 MCG CAPS capsule, Take 290 mcg by mouth daily., Disp: , Rfl:    Magnesium 400 MG CAPS, Take 400 mg by mouth 2 (two) times daily. , Disp: , Rfl:    ondansetron (ZOFRAN) 8 MG tablet, Take 1 tablet (8 mg  total) by mouth every 8 (eight) hours as needed for nausea or vomiting., Disp: 30 tablet, Rfl: 2   promethazine (PHENERGAN) 25 MG tablet, TAKE ONE TABLET BY MOUTH EVERY 6 HOURS FOR VOMITING OR NAUSEA, Disp: 30 tablet, Rfl: 1   oxyCODONE 10 MG TABS, Take 1 tablet (10 mg total) by mouth every 4 (four) hours as needed for severe pain ((score 7 to 10)). (Patient not taking: Reported on 10/18/2022), Disp: 35 tablet, Rfl: 0   potassium chloride SA (KLOR-CON M) 20 MEQ tablet, Take 1 tablet (20 mEq total) by mouth daily as needed (if more than 3 loose stools). (Patient not taking: Reported on 11/03/2022), Disp: 20 tablet, Rfl: 0   saccharomyces boulardii (FLORASTOR) 250 MG capsule, Take 1 capsule (250 mg total) by mouth 2 (two) times daily. (Patient not taking: Reported on 10/18/2022), Disp: 60 capsule, Rfl: 0   vancomycin (VANCOCIN) 10 G SOLR injection, Inject into the vein., Disp: , Rfl:    venlafaxine XR (EFFEXOR-XR) 150 MG 24 hr capsule, Take 2 capsules (300 mg total) by mouth daily., Disp: 60 capsule, Rfl: 11 Medication Side Effects: none  Family Medical/ Social History: Changes? See HPI  MENTAL HEALTH EXAM:  There were no vitals taken for this visit.There is no height or weight on file to calculate BMI.  General Appearance: Casual and Well Groomed  Eye Contact:  Good  Speech:  Clear and Coherent and Normal Rate  Volume:  Normal  Mood:  Depressed  Affect:  Blunt, Congruent, and Depressed  Thought Process:  Goal Directed and Descriptions of Associations: Circumstantial  Orientation:  Full (Time, Place, and Person)  Thought Content: Logical   Suicidal Thoughts:  No  Homicidal Thoughts:  No  Memory:  Immediate;   Fair Recent;   Fair Remote;   Good  Judgement:  Good  Insight:  Good  Psychomotor Activity:  Normal  Concentration:  Concentration: Good and Attention Span: Fair  Recall:  Good  Fund of Knowledge: Good  Language: Good  Assets:  Communication Skills Desire for  Improvement Financial Resources/Insurance Housing Transportation  ADL's:  Intact  Cognition: WNL  Prognosis:  Good   Reviewed discharge summary from hospitalization 3/32024-10/09/2022  DIAGNOSES:    ICD-10-CM   1. Major depressive disorder, recurrent episode, moderate (HCC)  F33.1     2. Generalized anxiety disorder  F41.1     3. Migraine syndrome  G43.909     4. Stress at home  F43.9     5. Relationship problem between parent and child  Z62.820     6. Relationship problem between partners  Z63.0       Receiving Psychotherapy: No    Dr. [name] Mitchum in the past  RECOMMENDATIONS:  PDMP was reviewed.  Klonopin filled 12/01/2022.  Gabapentin filled 11/13/2022.  Oxycodone filled 10/09/2022.   I provided 20 minutes of face to face time during this encounter, including time spent before and after the visit in records  review, medical decision making, counseling pertinent to today's visit, and charting.   I recommend increasing Effexor.  It causes dry mouth but several other meds can also do that.  Encouraged increase hydration.  She is willing to try it.  Continue Klonopin 1 mg, 1 p.o. 3 times daily as needed but limit to 2.5 mg a day. Continue Relpax 40 mg, 1 p.o. daily as needed migraine, may repeat in 2 hours as needed.  Neurology now prescribes. Continue gabapentin 300 mg, 3 p.o. nightly. Continue Zofran 8 mg, 1 p.o. every 8 hours as needed nausea and vomiting. Continue Phenergan 12.5 mg, 1 p.o. every 8 hours as needed nausea and vomiting. Increase Effexor XR 150 mg, to 2 po qd Recommend she restart therapy with Dr. Marliss Czar. Return in 6 weeks.  Melony Overly, PA-C

## 2022-12-29 ENCOUNTER — Other Ambulatory Visit: Payer: Self-pay

## 2022-12-29 ENCOUNTER — Ambulatory Visit: Payer: Medicare Other | Admitting: Family

## 2022-12-29 ENCOUNTER — Encounter: Payer: Self-pay | Admitting: Family

## 2022-12-29 ENCOUNTER — Ambulatory Visit: Payer: Medicare Other | Admitting: Internal Medicine

## 2022-12-29 VITALS — BP 117/80 | HR 89 | Temp 98.3°F | Resp 16 | Wt 176.6 lb

## 2022-12-29 DIAGNOSIS — R7881 Bacteremia: Secondary | ICD-10-CM

## 2022-12-29 DIAGNOSIS — T847XXD Infection and inflammatory reaction due to other internal orthopedic prosthetic devices, implants and grafts, subsequent encounter: Secondary | ICD-10-CM | POA: Diagnosis not present

## 2022-12-29 DIAGNOSIS — G003 Staphylococcal meningitis: Secondary | ICD-10-CM

## 2022-12-29 DIAGNOSIS — T8149XA Infection following a procedure, other surgical site, initial encounter: Secondary | ICD-10-CM | POA: Diagnosis not present

## 2022-12-29 DIAGNOSIS — B9561 Methicillin susceptible Staphylococcus aureus infection as the cause of diseases classified elsewhere: Secondary | ICD-10-CM | POA: Diagnosis not present

## 2022-12-29 LAB — CBC
Hemoglobin: 10.4 g/dL — ABNORMAL LOW (ref 11.7–15.5)
MCH: 27.4 pg (ref 27.0–33.0)
MCHC: 31.2 g/dL — ABNORMAL LOW (ref 32.0–36.0)
MCV: 87.9 fL (ref 80.0–100.0)
Platelets: 389 10*3/uL (ref 140–400)
RBC: 3.79 10*6/uL — ABNORMAL LOW (ref 3.80–5.10)
RDW: 13.2 % (ref 11.0–15.0)
WBC: 6.1 10*3/uL (ref 3.8–10.8)

## 2022-12-29 MED ORDER — DOXYCYCLINE HYCLATE 100 MG PO TABS
100.0000 mg | ORAL_TABLET | Freq: Two times a day (BID) | ORAL | 2 refills | Status: DC
Start: 2022-12-29 — End: 2023-01-03

## 2022-12-29 NOTE — Patient Instructions (Addendum)
Nice to see you.  We will check your lab work today.  Continue to take your medication daily as prescribed.  Refills have been sent to the pharmacy.  Plan for follow up in 3 months or sooner if needed with lab work on the same day.  Have a great day and stay safe!  

## 2022-12-29 NOTE — Progress Notes (Signed)
Subjective:    Patient ID: Amanda [name], female    DOB: [birth year], 62 y.o.   MRN: [id number]  Chief Complaint  Patient presents with   Follow-up    Patient reports doxycyline is still causing nausea and occasional vomiting     HPI:  Amanda [name] is a 62 y.o. 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 Earlene Plater on 11/03/22 near the completion of IV therapy with vancomycin and Cefazolin with transition to doxycycline for suppression. Here today for follow up.   [name] has been doing okay since last office visit. Taking the doxycyline as prescribed with regular nausea despite food intake which is helped with use of ondansetron. Has had vomiting that occurs when she has migraines. Back pain is doing well. No systemic symptoms of fevers or chills.    Allergies  Allergen Reactions   Cephalexin Diarrhea, Itching and Swelling   Metoclopramide Other (See Comments)    Irritates the restless legs   Vancomycin Other (See Comments)    "Muscle pain and hip pain"    Wellbutrin [Bupropion]     GERD   Aspirin Other (See Comments)    Makes her ears ring      Outpatient Medications Prior to Visit  Medication Sig Dispense Refill   aspirin-acetaminophen-caffeine (EXCEDRIN MIGRAINE) 250-250-65 MG tablet Take 2 tablets by mouth every 6 (six) hours as needed for headache or migraine.     Calcium Carbonate-Vit D-Min (CALCIUM 1200 PO) Take 1,200 mg by mouth 2 (two) times daily.      cholecalciferol (VITAMIN D3) 25 MCG (1000 UNIT) tablet Take 1,000 Units by mouth daily.     clonazePAM (KLONOPIN) 1 MG tablet TAKE ONE TABLET BY MOUTH TWICE A DAY MAY TAKE ADDITIONAL TABLET IF NEEDED 75 tablet 5   diphenoxylate-atropine (LOMOTIL) 2.5-0.025 MG tablet Take 1 tablet by mouth 2 (two) times daily. 30 tablet 0   eletriptan (RELPAX) 40 MG tablet TAKE ONE TABLET BY MOUTH AT ONSET OF HEADACHE; MAY REPEAT ONE TABLET IN 2 HOURS IF NEEDED.  (Patient taking differently: Take 40 mg by mouth every 2 (two) hours as needed for headache.) 10 tablet 11   esomeprazole (NEXIUM) 20 MG capsule Take 20 mg by mouth daily at 12 noon.     gabapentin (NEURONTIN) 300 MG capsule TAKE THREE CAPSULES BY MOUTH EVERY NIGHT AT BEDTIME (Patient taking differently: 900 mg at bedtime. TAKE THREE CAPSULES BY MOUTH EVERY NIGHT AT BEDTIME) 270 capsule 1   LINZESS 290 MCG CAPS capsule Take 290 mcg by mouth daily.     Magnesium 400 MG CAPS Take 400 mg by mouth 2 (two) times daily.      ondansetron (ZOFRAN) 8 MG tablet Take 1 tablet (8 mg total) by mouth every 8 (eight) hours as needed for nausea or vomiting. 30 tablet 2   oxyCODONE 10 MG TABS Take 1 tablet (10 mg total) by mouth every 4 (four) hours as needed for severe pain ((score 7 to 10)). 35 tablet 0   potassium chloride SA (KLOR-CON M) 20 MEQ tablet Take 1 tablet (20 mEq total) by mouth daily as needed (if more than 3 loose stools). 20 tablet 0   promethazine (PHENERGAN) 25 MG tablet TAKE ONE TABLET BY MOUTH EVERY 6 HOURS FOR VOMITING OR NAUSEA 30 tablet 1   saccharomyces boulardii (FLORASTOR) 250 MG capsule Take 1 capsule (250 mg total) by mouth 2 (two) times daily. 60 capsule 0  venlafaxine XR (EFFEXOR-XR) 150 MG 24 hr capsule Take 2 capsules (300 mg total) by mouth daily. 60 capsule 11   doxycycline (VIBRA-TABS) 100 MG tablet Take 1 tablet (100 mg total) by mouth 2 (two) times daily. 60 tablet 2   vancomycin (VANCOCIN) 10 G SOLR injection Inject into the vein.     No facility-administered medications prior to visit.     Past Medical History:  Diagnosis Date   Allergic reaction 10/27/2022   Altered mental status 04/19/2016   Depression    History of total hysterectomy    Hyperglycemia 04/19/2016   Intractable migraine without aura 12/04/2012   Migraine with intractable migraine    PONV (postoperative nausea and vomiting)    Restless leg syndrome      Past Surgical History:  Procedure  Laterality Date   MAXIMUM ACCESS (MAS)POSTERIOR LUMBAR INTERBODY FUSION (PLIF) 1 LEVEL  10/02/2022   Procedure: POSTERIOR LUMBAR INTERBODY FUSION Lumbar Four - Five;  Surgeon: Coletta Memos, MD;  Location: [facility] OR;  Service: Neurosurgery;;   TEE WITHOUT CARDIOVERSION N/A 10/01/2022   Procedure: TRANSESOPHAGEAL ECHOCARDIOGRAM (TEE);  Surgeon: Dorthula Nettles, DO;  Location: [facility] ENDOSCOPY;  Service: Cardiovascular;  Laterality: N/A;   TOTAL ABDOMINAL HYSTERECTOMY     WOUND EXPLORATION N/A 10/02/2022   Procedure: WOUND EXPLORATION;  Surgeon: Coletta Memos, MD;  Location: [facility] OR;  Service: Neurosurgery;  Laterality: N/A;     Review of Systems  Constitutional:  Negative for chills, diaphoresis, fatigue and fever.  Respiratory:  Negative for cough, chest tightness, shortness of breath and wheezing.   Cardiovascular:  Negative for chest pain.  Gastrointestinal:  Negative for abdominal pain, diarrhea, nausea and vomiting.      Objective:    BP 117/80   Pulse 89   Temp 98.3 F (36.8 C) (Oral)   Resp 16   Wt 176 lb 9.6 oz (80.1 kg)   SpO2 97%   BMI 27.66 kg/m  Nursing note and vital signs reviewed.  Physical Exam Constitutional:      General: She is not in acute distress.    Appearance: She is well-developed.  Cardiovascular:     Rate and Rhythm: Normal rate and regular rhythm.     Heart sounds: Normal heart sounds.  Pulmonary:     Effort: Pulmonary effort is normal.     Breath sounds: Normal breath sounds.  Skin:    General: Skin is warm and dry.  Neurological:     Mental Status: She is alert and oriented to person, place, and time.  Psychiatric:        Behavior: Behavior normal.        Thought Content: Thought content normal.        Judgment: Judgment normal.         12/29/2022    2:38 PM  Depression screen PHQ 2/9  Decreased Interest 0  Down, Depressed, Hopeless 0  PHQ - 2 Score 0       Assessment & Plan:    Patient Active Problem List   Diagnosis Date Noted    Allergic reaction 10/27/2022   Other spinal cerebrospinal fluid leak 10/02/2022   Meningitis, staphylococcal 09/28/2022   MSSA bacteremia 09/27/2022   Acute kidney injury (HCC) 09/27/2022   Wound infection complicating hardware (HCC) 09/27/2022   Sepsis with encephalopathy without septic shock (HCC) 09/27/2022   Severe sepsis (HCC) 09/26/2022   Low back pain 08/26/2022   Mouth droop due to facial weakness 09/06/2018   Other migraine without status migrainosus, not  intractable    Acute encephalopathy 04/19/2016   Restless legs syndrome (RLS) 12/04/2012     Problem List Items Addressed This Visit       Nervous and Auditory   Meningitis, staphylococcal   Relevant Medications   doxycycline (VIBRA-TABS) 100 MG tablet   Other Relevant Orders   COMPLETE METABOLIC PANEL WITH GFR   CBC   Sedimentation rate   C-reactive protein     Other   MSSA bacteremia   Relevant Medications   doxycycline (VIBRA-TABS) 100 MG tablet   Other Relevant Orders   COMPLETE METABOLIC PANEL WITH GFR   CBC   Sedimentation rate   C-reactive protein   Wound infection complicating hardware (HCC) - Primary    Ms. Qualls has been doing okay with associated nausea with current dose of doxycycline that is improved with ondansetron. Discussed possible switch to Augmentin and she wishes to continue with doxycyline for now. Reviewed plan of care to include prolonged duration of antibiotics of at least a year and possibly lifelong as the only test of cure is stopping antibiotics. Check lab work today. Continue doxycycline. Plan for follow up in 3 months or sooner if needed.         I have discontinued Christiane E. Bai's vancomycin. I am also having her maintain her Calcium Carbonate-Vit D-Min (CALCIUM 1200 PO), Magnesium, aspirin-acetaminophen-caffeine, esomeprazole, cholecalciferol, gabapentin, eletriptan, Linzess, Oxycodone HCl, diphenoxylate-atropine, saccharomyces boulardii, potassium chloride SA,  clonazePAM, ondansetron, promethazine, venlafaxine XR, and doxycycline.   Meds ordered this encounter  Medications   doxycycline (VIBRA-TABS) 100 MG tablet    Sig: Take 1 tablet (100 mg total) by mouth 2 (two) times daily.    Dispense:  60 tablet    Refill:  2    Order Specific Question:   Supervising Provider    Answer:   Judyann Munson [[id number]]     Follow-up: Return in about 3 months (around 03/31/2023), or if symptoms worsen or fail to improve.   Marcos Eke, MSN, FNP-C Nurse Practitioner [facility] Main number: [phone]

## 2022-12-29 NOTE — Assessment & Plan Note (Signed)
Ms. Sluyter has been doing okay with associated nausea with current dose of doxycycline that is improved with ondansetron. Discussed possible switch to Augmentin and she wishes to continue with doxycyline for now. Reviewed plan of care to include prolonged duration of antibiotics of at least a year and possibly lifelong as the only test of cure is stopping antibiotics. Check lab work today. Continue doxycycline. Plan for follow up in 3 months or sooner if needed.

## 2022-12-30 LAB — COMPLETE METABOLIC PANEL WITH GFR
AG Ratio: 1.7 (calc) (ref 1.0–2.5)
ALT: 11 U/L (ref 6–29)
AST: 17 U/L (ref 10–35)
Albumin: 4.1 g/dL (ref 3.6–5.1)
Alkaline phosphatase (APISO): 87 U/L (ref 37–153)
BUN/Creatinine Ratio: 17 (calc) (ref 6–22)
BUN: 24 mg/dL (ref 7–25)
CO2: 27 mmol/L (ref 20–32)
Calcium: 9 mg/dL (ref 8.6–10.4)
Chloride: 102 mmol/L (ref 98–110)
Creat: 1.42 mg/dL — ABNORMAL HIGH (ref 0.50–1.05)
Globulin: 2.4 g/dL (calc) (ref 1.9–3.7)
Glucose, Bld: 97 mg/dL (ref 65–99)
Potassium: 4.7 mmol/L (ref 3.5–5.3)
Sodium: 138 mmol/L (ref 135–146)
Total Bilirubin: 0.3 mg/dL (ref 0.2–1.2)
Total Protein: 6.5 g/dL (ref 6.1–8.1)
eGFR: 42 mL/min/{1.73_m2} — ABNORMAL LOW (ref >=60)

## 2022-12-30 LAB — CBC
HCT: 33.3 % — ABNORMAL LOW (ref 35.0–45.0)
MPV: 9.2 fL (ref 7.5–12.5)

## 2022-12-30 LAB — C-REACTIVE PROTEIN: CRP: <3 mg/L (ref <8.0)

## 2022-12-30 LAB — SEDIMENTATION RATE: Sed Rate: 14 mm/h (ref 0–30)

## 2023-01-03 MED ORDER — AMOXICILLIN-POT CLAVULANATE 875-125 MG PO TABS: 1.0000 | ORAL_TABLET | Freq: Two times a day (BID) | ORAL | 2 refills | Status: DC

## 2023-01-03 NOTE — Addendum Note (Signed)
Addended by: Jeanine Luz D on: 01/03/2023 09:27 AM   Modules accepted: Orders

## 2023-01-19 MED ORDER — LINZESS 290 MCG PO CAPS: 290.0000 ug | ORAL_CAPSULE | Freq: Every day | ORAL | 0 refills | Status: DC

## 2023-01-19 MED ORDER — ONDANSETRON HCL 8 MG PO TABS
8.0000 mg | ORAL_TABLET | Freq: Three times a day (TID) | ORAL | 2 refills | Status: DC | PRN
Start: 2023-01-19 — End: 2023-10-26

## 2023-01-19 NOTE — Addendum Note (Signed)
Addended by: Jeanine Luz D on: 01/19/2023 08:47 AM   Modules accepted: Orders

## 2023-02-01 ENCOUNTER — Ambulatory Visit: Payer: Medicare Other | Admitting: Physician Assistant

## 2023-02-02 ENCOUNTER — Other Ambulatory Visit (HOSPITAL_COMMUNITY): Payer: Self-pay

## 2023-02-02 ENCOUNTER — Telehealth: Payer: Self-pay

## 2023-02-02 NOTE — Telephone Encounter (Signed)
[facility] Patient Advocate Encounter  Prior Authorization for Sivextro has been approved.    PA# [id number] Effective dates: 02/02/23 through 02/08/23  Patients co-pay is $1795.97.   [facility] will continue to follow.  Clearance Coots, CPhT Specialty Pharmacy Patient [facility] Phone: [phone] Fax:  [phone]

## 2023-02-02 NOTE — Telephone Encounter (Signed)
[facility] Patient Advocate Encounter   Received notification from [facility] that prior authorization for Sivextro is required.   PA submitted on 02/02/23 Key [id number] Status is pending    [facility] will continue to follow.   Clearance Coots, CPhT Specialty Pharmacy Patient [facility] Phone: [phone] Fax:  [phone]

## 2023-02-09 MED ORDER — AMOXICILLIN 500 MG PO CAPS: 1000.0000 mg | ORAL_CAPSULE | Freq: Three times a day (TID) | ORAL | 5 refills | Status: DC

## 2023-02-09 MED ORDER — PROMETHAZINE HCL 25 MG PO TABS: 12.5000 mg | ORAL_TABLET | Freq: Four times a day (QID) | ORAL | 1 refills | Status: DC | PRN

## 2023-02-09 NOTE — Addendum Note (Signed)
Addended by: Jeanine Luz D on: 02/09/2023 11:47 AM   Modules accepted: Orders

## 2023-02-10 DIAGNOSIS — K080 Exfoliation of teeth due to systemic causes: Secondary | ICD-10-CM | POA: Diagnosis not present

## 2023-02-16 DIAGNOSIS — K080 Exfoliation of teeth due to systemic causes: Secondary | ICD-10-CM | POA: Diagnosis not present

## 2023-03-31 ENCOUNTER — Ambulatory Visit: Payer: Medicare Other | Admitting: Family

## 2023-03-31 ENCOUNTER — Encounter: Payer: Self-pay | Admitting: Family

## 2023-03-31 ENCOUNTER — Other Ambulatory Visit: Payer: Self-pay

## 2023-03-31 VITALS — BP 118/82 | HR 99 | Resp 16 | Ht 67.0 in | Wt 175.0 lb

## 2023-03-31 DIAGNOSIS — T8463XD Infection and inflammatory reaction due to internal fixation device of spine, subsequent encounter: Secondary | ICD-10-CM

## 2023-03-31 DIAGNOSIS — B9561 Methicillin susceptible Staphylococcus aureus infection as the cause of diseases classified elsewhere: Secondary | ICD-10-CM | POA: Diagnosis not present

## 2023-03-31 DIAGNOSIS — R7881 Bacteremia: Secondary | ICD-10-CM | POA: Diagnosis not present

## 2023-03-31 DIAGNOSIS — T847XXD Infection and inflammatory reaction due to other internal orthopedic prosthetic devices, implants and grafts, subsequent encounter: Secondary | ICD-10-CM

## 2023-03-31 NOTE — Patient Instructions (Signed)
Nice to see you.  We will check your lab work today.  Continue to take your medication daily as prescribed.  Refills are available at the pharmacy.  Plan for follow up pending Neurosurgery evaluation.   Have a great day and stay safe!

## 2023-03-31 NOTE — Progress Notes (Signed)
Subjective:    Patient ID: Amanda [name], female    DOB: July 23, 1961, 62 y.o.   MRN: [id number]  Chief Complaint  Patient presents with   Follow-up    MSSA spinal infection with hardware     HPI:  Amanda [name] is a 62 y.o. 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 Had nausea and vomiting with doxycycline which was changed to Augmentin and once again intolerance resulting in change to Amoxcillin. Presents today for follow up.  Amanda [name] has not been doing well since her last office visit and about 1 month ago began having worsening back pain and also experienced a fall out of bed landing on her back and striking her head on the nightstand about 2 weeks ago. Was not seen following this incident. Continues to take amoxicillin as prescribed with no adverse side effects. Worsening pain when seated on her toilet with increased back pain and radiculopathy down her right side. No fevers or chills. Surgical incision remains closed. Has appointment with Dr. Franky Macho on Monday next week.    Allergies  Allergen Reactions   Cephalexin Diarrhea, Itching and Swelling   Metoclopramide Other (See Comments)    Irritates the restless legs   Vancomycin Other (See Comments)    "Muscle pain and hip pain"    Wellbutrin [Bupropion]     GERD   Aspirin Other (See Comments)    Makes her ears ring      Outpatient Medications Prior to Visit  Medication Sig Dispense Refill   amoxicillin (AMOXIL) 500 MG capsule Take 2 capsules (1,000 mg total) by mouth 3 (three) times daily. 180 capsule 5   aspirin-acetaminophen-caffeine (EXCEDRIN MIGRAINE) 250-250-65 MG tablet Take 2 tablets by mouth every 6 (six) hours as needed for headache or migraine.     Calcium Carbonate-Vit D-Min (CALCIUM 1200 PO) Take 1,200 mg by mouth 2 (two) times daily.      clonazePAM  (KLONOPIN) 1 MG tablet TAKE ONE TABLET BY MOUTH TWICE A DAY MAY TAKE ADDITIONAL TABLET IF NEEDED 75 tablet 5   esomeprazole (NEXIUM) 20 MG capsule Take 20 mg by mouth daily at 12 noon.     gabapentin (NEURONTIN) 300 MG capsule TAKE THREE CAPSULES BY MOUTH EVERY NIGHT AT BEDTIME (Patient taking differently: 900 mg at bedtime. TAKE THREE CAPSULES BY MOUTH EVERY NIGHT AT BEDTIME) 270 capsule 1   Magnesium 400 MG CAPS Take 400 mg by mouth 2 (two) times daily.      ondansetron (ZOFRAN) 8 MG tablet Take 1 tablet (8 mg total) by mouth every 8 (eight) hours as needed for nausea or vomiting. 30 tablet 2   promethazine (PHENERGAN) 25 MG tablet Take 0.5-1 tablets (12.5-25 mg total) by mouth every 6 (six) hours as needed for nausea or vomiting. 30 tablet 1   venlafaxine XR (EFFEXOR-XR) 150 MG 24 hr capsule Take 2 capsules (300 mg total) by mouth daily. 60 capsule 11   cholecalciferol (VITAMIN D3) 25 MCG (1000 UNIT) tablet Take 1,000 Units by mouth daily.     diphenoxylate-atropine (LOMOTIL) 2.5-0.025 MG tablet Take 1 tablet by mouth 2 (two) times daily. 30 tablet 0   eletriptan (RELPAX) 40 MG tablet TAKE ONE TABLET BY MOUTH AT ONSET OF HEADACHE; MAY REPEAT ONE TABLET IN 2 HOURS IF NEEDED. (Patient taking differently: Take 40 mg by mouth every 2 (two) hours as needed for headache.)  10 tablet 11   LINZESS 290 MCG CAPS capsule Take 1 capsule (290 mcg total) by mouth daily. 30 capsule 0   oxyCODONE 10 MG TABS Take 1 tablet (10 mg total) by mouth every 4 (four) hours as needed for severe pain ((score 7 to 10)). 35 tablet 0   potassium chloride SA (KLOR-CON M) 20 MEQ tablet Take 1 tablet (20 mEq total) by mouth daily as needed (if more than 3 loose stools). 20 tablet 0   saccharomyces boulardii (FLORASTOR) 250 MG capsule Take 1 capsule (250 mg total) by mouth 2 (two) times daily. 60 capsule 0   No facility-administered medications prior to visit.     Past Medical History:  Diagnosis Date   Allergic reaction  10/27/2022   Altered mental status 04/19/2016   Depression    History of total hysterectomy    Hyperglycemia 04/19/2016   Intractable migraine without aura 12/04/2012   Migraine with intractable migraine    PONV (postoperative nausea and vomiting)    Restless leg syndrome      Past Surgical History:  Procedure Laterality Date   MAXIMUM ACCESS (MAS)POSTERIOR LUMBAR INTERBODY FUSION (PLIF) 1 LEVEL  10/02/2022   Procedure: POSTERIOR LUMBAR INTERBODY FUSION Lumbar Four - Five;  Surgeon: Coletta Memos, MD;  Location: [facility] OR;  Service: Neurosurgery;;   TEE WITHOUT CARDIOVERSION N/A 10/01/2022   Procedure: TRANSESOPHAGEAL ECHOCARDIOGRAM (TEE);  Surgeon: Dorthula Nettles, DO;  Location: [facility] ENDOSCOPY;  Service: Cardiovascular;  Laterality: N/A;   TOTAL ABDOMINAL HYSTERECTOMY     WOUND EXPLORATION N/A 10/02/2022   Procedure: WOUND EXPLORATION;  Surgeon: Coletta Memos, MD;  Location: [facility] OR;  Service: Neurosurgery;  Laterality: N/A;       Review of Systems  Constitutional:  Negative for chills, diaphoresis, fatigue and fever.  Respiratory:  Negative for cough, chest tightness, shortness of breath and wheezing.   Cardiovascular:  Negative for chest pain.  Gastrointestinal:  Negative for abdominal pain, diarrhea, nausea and vomiting.  Musculoskeletal:  Positive for back pain.      Objective:    BP 118/82   Pulse 99   Resp 16   Ht 5\' 7"  (1.702 m)   Wt 175 lb (79.4 kg)   BMI 27.41 kg/m  Nursing note and vital signs reviewed.  Physical Exam Constitutional:      General: She is not in acute distress.    Appearance: She is well-developed.     Comments: Standing next to the exam table; painful to sit.   Cardiovascular:     Rate and Rhythm: Normal rate and regular rhythm.     Heart sounds: Normal heart sounds.  Pulmonary:     Effort: Pulmonary effort is normal.     Breath sounds: Normal breath sounds.  Skin:    General: Skin is warm and dry.  Neurological:     Mental Status: She is  alert and oriented to person, place, and time.         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   MSSA bacteremia 09/27/2022   Acute kidney injury (HCC) 09/27/2022   Wound infection complicating hardware (HCC)  09/27/2022   Sepsis with encephalopathy without septic shock (HCC) 09/27/2022   Severe sepsis (HCC) 09/26/2022   Low back pain 08/26/2022   Mouth droop due to facial weakness 09/06/2018   Other migraine without status migrainosus, not intractable    Acute encephalopathy 04/19/2016   Restless legs syndrome (RLS) 12/04/2012     Problem List Items Addressed This Visit       Other   MSSA bacteremia   Wound infection complicating hardware (HCC) - Primary    Amanda [name] has increased levels of back pain on going for about 1 month with a fall on her back about 2 weeks ago. This is concerning for recurrent infection and will likely need additional imaging to evaluate appropriately. She has follow up with Dr. Franky Macho on Monday. Will continue current dose of Amoxicillin for now. Antibiotic selection remains very limited secondary to intolerances to multiple medications. Check inflammatory markers and CBC w/diff. Plan for follow up pending lab work results and follow up with Neurosurgery.       Relevant Orders   BASIC METABOLIC PANEL WITH GFR   C-reactive protein   CBC with Differential/Platelet   Sedimentation rate     I am having Janice E. Seely maintain her Calcium Carbonate-Vit D-Min (CALCIUM 1200 PO), Magnesium, aspirin-acetaminophen-caffeine, esomeprazole, cholecalciferol, gabapentin, eletriptan, Oxycodone HCl, diphenoxylate-atropine, saccharomyces boulardii,  potassium chloride SA, clonazePAM, venlafaxine XR, Linzess, ondansetron, promethazine, and amoxicillin.   Follow-up:  Pending lab work and follow up with Neurosurgery.    Marcos Eke, MSN, FNP-C Nurse Practitioner [facility] Main number: [phone]

## 2023-03-31 NOTE — Assessment & Plan Note (Signed)
[name] has increased levels of back pain on going for about 1 month with a fall on her back about 2 weeks ago. This is concerning for recurrent infection and will likely need additional imaging to evaluate appropriately. She has follow up with Dr. Franky Macho on Monday. Will continue current dose of Amoxicillin for now. Antibiotic selection remains very limited secondary to intolerances to multiple medications. Check inflammatory markers and CBC w/diff. Plan for follow up pending lab work results and follow up with Neurosurgery.

## 2023-03-31 NOTE — Assessment & Plan Note (Deleted)
Amanda [name] has increased levels of back pain on going for about 1 month with a fall on her back about 2 weeks ago. This is concerning for recurrent infection and will likely need additional imaging to evaluate appropriately. She has follow up with Dr. Franky Macho on Monday. Will continue current dose of Amoxicillin for now. Antibiotic selection remains very limited secondary to intolerances to multiple medications. Check inflammatory markers and CBC w/diff. Plan for follow up pending lab work results and follow up with Neurosurgery.

## 2023-04-04 DIAGNOSIS — M5126 Other intervertebral disc displacement, lumbar region: Secondary | ICD-10-CM | POA: Diagnosis not present

## 2023-04-04 DIAGNOSIS — Z6827 Body mass index (BMI) 27.0-27.9, adult: Secondary | ICD-10-CM | POA: Diagnosis not present

## 2023-04-13 DIAGNOSIS — M48061 Spinal stenosis, lumbar region without neurogenic claudication: Secondary | ICD-10-CM | POA: Diagnosis not present

## 2023-04-13 DIAGNOSIS — R200 Anesthesia of skin: Secondary | ICD-10-CM | POA: Diagnosis not present

## 2023-04-13 DIAGNOSIS — M549 Dorsalgia, unspecified: Secondary | ICD-10-CM | POA: Diagnosis not present

## 2023-04-13 DIAGNOSIS — M5126 Other intervertebral disc displacement, lumbar region: Secondary | ICD-10-CM | POA: Diagnosis not present

## 2023-04-13 DIAGNOSIS — M5136 Other intervertebral disc degeneration, lumbar region: Secondary | ICD-10-CM | POA: Diagnosis not present

## 2023-04-28 DIAGNOSIS — M5126 Other intervertebral disc displacement, lumbar region: Secondary | ICD-10-CM | POA: Diagnosis not present

## 2023-04-28 DIAGNOSIS — Z6827 Body mass index (BMI) 27.0-27.9, adult: Secondary | ICD-10-CM | POA: Diagnosis not present

## 2023-05-03 ENCOUNTER — Other Ambulatory Visit: Payer: Self-pay | Admitting: Physician Assistant

## 2023-05-09 ENCOUNTER — Encounter: Payer: Self-pay | Admitting: Physician Assistant

## 2023-05-09 ENCOUNTER — Ambulatory Visit (INDEPENDENT_AMBULATORY_CARE_PROVIDER_SITE_OTHER): Payer: Medicare Other | Admitting: Physician Assistant

## 2023-05-09 DIAGNOSIS — G43909 Migraine, unspecified, not intractable, without status migrainosus: Secondary | ICD-10-CM | POA: Diagnosis not present

## 2023-05-09 DIAGNOSIS — F3341 Major depressive disorder, recurrent, in partial remission: Secondary | ICD-10-CM

## 2023-05-09 DIAGNOSIS — G2581 Restless legs syndrome: Secondary | ICD-10-CM

## 2023-05-09 DIAGNOSIS — F411 Generalized anxiety disorder: Secondary | ICD-10-CM | POA: Diagnosis not present

## 2023-05-09 MED ORDER — GABAPENTIN 300 MG PO CAPS: ORAL_CAPSULE | ORAL | 1 refills | Status: DC

## 2023-05-09 NOTE — Progress Notes (Signed)
[facility] Check  Patient ID: Amanda [name],  MRN: [id number]  PCP: Merri Brunette, MD  Date of Evaluation: 05/09/2023 Time spent:20 minutes  Chief Complaint:  Chief Complaint   Anxiety; Depression; Follow-up    HISTORY/CURRENT STATUS: HPI  For routine med check.  Is doing better. The increased Effexor at the LOV has really helped her mood. She enjoys spending time with her dogs, they now have 4 after getting a puppy yesterday, watching tv, reading. Is able to see one of her Granddaughters every other weekend.  Stress at home with husband is much better. Her MIL died a month ago, and that was stressful for him, so now that he's not under that stress, things have improved with their relationship.  There is still the stress with her adult daughters.  It is no worse or better.  Patient states she has learned to live with it though.  She is not having panic attacks but does still get overwhelmed on a fairly regular basis.  The Klonopin is helpful when needed.  She tries not to take it though.  Her migraines are controlled.  Energy and motivation are fair to good depending on the day.  She is unable to work due to both physical and mental health problems.  No extreme sadness, tearfulness, or feelings of hopelessness.  She usually sleeps well. ADLs and personal hygiene are normal.   Denies any changes in concentration, making decisions, or remembering things.  Appetite has not changed.  Weight is stable.  Denies suicidal or homicidal thoughts.  Patient denies increased energy with decreased need for sleep, increased talkativeness, racing thoughts, impulsivity or risky behaviors, increased spending, increased libido, grandiosity, increased irritability or anger, paranoia, or hallucinations.  Denies dizziness, syncope, seizures, numbness, tingling, tremor, tics, unsteady gait, slurred speech, confusion. Denies muscle or joint pain, stiffness, or dystonia.  Individual Medical History/  Review of Systems: Changes? :No      Past medications for mental health diagnoses include: Effexor, Wellbutrin, Prozac caused weight loss and GI, Relpax, Klonopin  Allergies: Cephalexin, Metoclopramide, Vancomycin, Wellbutrin [bupropion], and Aspirin  Current Medications:  Current Outpatient Medications:    aspirin-acetaminophen-caffeine (EXCEDRIN MIGRAINE) 250-250-65 MG tablet, Take 2 tablets by mouth every 6 (six) hours as needed for headache or migraine., Disp: , Rfl:    Calcium Carbonate-Vit D-Min (CALCIUM 1200 PO), Take 1,200 mg by mouth 2 (two) times daily. , Disp: , Rfl:    cholecalciferol (VITAMIN D3) 25 MCG (1000 UNIT) tablet, Take 1,000 Units by mouth daily., Disp: , Rfl:    clonazePAM (KLONOPIN) 1 MG tablet, TAKE 1 TABLET BY MOUTH 2 TIMES A DAY MAY TAKE ADDITIONAL TABLET IF NEEDED, Disp: 75 tablet, Rfl: 5   eletriptan (RELPAX) 40 MG tablet, TAKE ONE TABLET BY MOUTH AT ONSET OF HEADACHE; MAY REPEAT ONE TABLET IN 2 HOURS IF NEEDED. (Patient taking differently: Take 40 mg by mouth every 2 (two) hours as needed for headache.), Disp: 10 tablet, Rfl: 11   esomeprazole (NEXIUM) 20 MG capsule, Take 20 mg by mouth daily at 12 noon., Disp: , Rfl:    Magnesium 400 MG CAPS, Take 400 mg by mouth 2 (two) times daily. , Disp: , Rfl:    ondansetron (ZOFRAN) 8 MG tablet, Take 1 tablet (8 mg total) by mouth every 8 (eight) hours as needed for nausea or vomiting., Disp: 30 tablet, Rfl: 2   venlafaxine XR (EFFEXOR-XR) 150 MG 24 hr capsule, Take 2 capsules (300 mg total) by mouth daily., Disp: 60 capsule,  Rfl: 11   amoxicillin (AMOXIL) 500 MG capsule, Take 2 capsules (1,000 mg total) by mouth 3 (three) times daily. (Patient not taking: Reported on 05/09/2023), Disp: 180 capsule, Rfl: 5   diphenoxylate-atropine (LOMOTIL) 2.5-0.025 MG tablet, Take 1 tablet by mouth 2 (two) times daily. (Patient not taking: Reported on 05/09/2023), Disp: 30 tablet, Rfl: 0   gabapentin (NEURONTIN) 300 MG capsule, TAKE THREE  CAPSULES BY MOUTH EVERY NIGHT AT BEDTIME, Disp: 270 capsule, Rfl: 1   LINZESS 290 MCG CAPS capsule, Take 1 capsule (290 mcg total) by mouth daily. (Patient not taking: Reported on 05/09/2023), Disp: 30 capsule, Rfl: 0   oxyCODONE 10 MG TABS, Take 1 tablet (10 mg total) by mouth every 4 (four) hours as needed for severe pain ((score 7 to 10)). (Patient not taking: Reported on 05/09/2023), Disp: 35 tablet, Rfl: 0   potassium chloride SA (KLOR-CON M) 20 MEQ tablet, Take 1 tablet (20 mEq total) by mouth daily as needed (if more than 3 loose stools). (Patient not taking: Reported on 05/09/2023), Disp: 20 tablet, Rfl: 0   promethazine (PHENERGAN) 25 MG tablet, Take 0.5-1 tablets (12.5-25 mg total) by mouth every 6 (six) hours as needed for nausea or vomiting. (Patient not taking: Reported on 05/09/2023), Disp: 30 tablet, Rfl: 1   saccharomyces boulardii (FLORASTOR) 250 MG capsule, Take 1 capsule (250 mg total) by mouth 2 (two) times daily. (Patient not taking: Reported on 05/09/2023), Disp: 60 capsule, Rfl: 0 Medication Side Effects: none  Family Medical/ Social History: Changes? See HPI  MENTAL HEALTH EXAM:  There were no vitals taken for this visit.There is no height or weight on file to calculate BMI.  General Appearance: Casual and Well Groomed  Eye Contact:  Good  Speech:  Clear and Coherent and Normal Rate  Volume:  Normal  Mood:  Euthymic  Affect:  Congruent  Thought Process:  Goal Directed and Descriptions of Associations: Circumstantial  Orientation:  Full (Time, Place, and Person)  Thought Content: Logical   Suicidal Thoughts:  No  Homicidal Thoughts:  No  Memory:  Immediate;   Fair Recent;   Fair Remote;   Good  Judgement:  Good  Insight:  Good  Psychomotor Activity:  Normal  Concentration:  Concentration: Good and Attention Span: Fair  Recall:  Good  Fund of Knowledge: Good  Language: Good  Assets:  Communication Skills Desire for Improvement Financial  Resources/Insurance Housing Resilience Transportation  ADL's:  Intact  Cognition: WNL  Prognosis:  Good   DIAGNOSES:    ICD-10-CM   1. Recurrent major depressive disorder, in partial remission (HCC)  F33.41     2. Generalized anxiety disorder  F41.1     3. Restless leg syndrome  G25.81     4. Migraine syndrome  G43.909      Receiving Psychotherapy: No    Dr. [name] Mitchum in the past  RECOMMENDATIONS:  PDMP was reviewed.  Klonopin filled 05/04/2023.  Hydrocodone and tramadol given in the past 6 months. I provided 20 minutes of face to face time during this encounter, including time spent before and after the visit in records review, medical decision making, counseling pertinent to today's visit, and charting.   I am glad to see her doing well as far as her medications go.  She would like to see Dr. Marliss Czar again, just to get help with family relationship issues.  Continue Klonopin 1 mg, 1 p.o. 3 times daily as needed but limit to 2.5 mg a day. Continue  Relpax 40 mg, 1 p.o. daily as needed migraine, may repeat in 2 hours as needed.  Neurology now prescribes. Continue gabapentin 300 mg, 3 p.o. nightly. Continue Zofran 8 mg, 1 p.o. every 8 hours as needed nausea and vomiting. Continue Phenergan 12.5 mg, 1 p.o. every 8 hours as needed nausea and vomiting. Increase Effexor XR 150 mg, to 2 po qd Restart therapy with Dr. Marliss Czar. Return in 6 months.  Melony Overly, PA-C

## 2023-05-11 ENCOUNTER — Ambulatory Visit: Payer: Medicare Other | Admitting: Psychiatry

## 2023-05-11 DIAGNOSIS — Z62820 Parent-biological child conflict: Secondary | ICD-10-CM | POA: Diagnosis not present

## 2023-05-11 DIAGNOSIS — F411 Generalized anxiety disorder: Secondary | ICD-10-CM

## 2023-05-11 DIAGNOSIS — F3341 Major depressive disorder, recurrent, in partial remission: Secondary | ICD-10-CM

## 2023-05-11 DIAGNOSIS — F4310 Post-traumatic stress disorder, unspecified: Secondary | ICD-10-CM

## 2023-05-11 DIAGNOSIS — Z630 Problems in relationship with spouse or partner: Secondary | ICD-10-CM
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
## 2023-05-12 DIAGNOSIS — H524 Presbyopia: Secondary | ICD-10-CM | POA: Diagnosis not present

## 2023-05-12 DIAGNOSIS — H43812 Vitreous degeneration, left eye: Secondary | ICD-10-CM | POA: Diagnosis not present

## 2023-05-23 ENCOUNTER — Ambulatory Visit: Payer: Medicare Other | Admitting: Psychiatry

## 2023-06-22 ENCOUNTER — Ambulatory Visit: Payer: Medicare Other | Admitting: Psychiatry

## 2023-06-22 DIAGNOSIS — F4310 Post-traumatic stress disorder, unspecified: Secondary | ICD-10-CM

## 2023-06-22 DIAGNOSIS — Z62820 Parent-biological child conflict: Secondary | ICD-10-CM

## 2023-06-22 DIAGNOSIS — F3341 Major depressive disorder, recurrent, in partial remission: Secondary | ICD-10-CM

## 2023-06-22 DIAGNOSIS — F411 Generalized anxiety disorder: Secondary | ICD-10-CM

## 2023-06-22 DIAGNOSIS — G43909 Migraine, unspecified, not intractable, without status migrainosus: Secondary | ICD-10-CM

## 2023-06-22 DIAGNOSIS — Z630 Problems in relationship with spouse or partner: Secondary | ICD-10-CM

## 2023-06-22 NOTE — Progress Notes (Signed)
 Psychotherapy Progress Note [facility] Delora Ferry, PhD LP  Patient ID: Amanda [name] [facility])    MRN: [id number] Therapy format: Individual psychotherapy Date: 06/22/2023      Start: 9:06a     Stop: 9:52a     Time Spent: 46 min Location: In-person   Session narrative (presenting needs, interim history, self-report of stressors and symptoms, applications of prior therapy, status changes, and interventions made in session) Seen 6 wks ago, with [facility] 4 wks ago for migraine.  The one session made a deep change in fear, not afraid any more to go back to the hospital.  The intrusive thought has disappeared, attributed to just being able to tell her story and have the amygdala explained.  Helped H (temporarily) to share that with him, too, but says he basically lasted 24 hrs understanding her better then then went back to his habitual negativity.  Attributed in session to his constant RA pain.  On the up side, they have a puppy now, which at one point would have been prohibitive.  Largely complains of Amanda [name] being short, inhospitable, interrupting, hypocritically judging her pain tolerance, and taking out his own pain on her in various ways.    Agreed that Amanda [name] learned hypersensitivity to her being ill when she was so chronically down with migraines and that there is no good reason to believe that has been unlearned by now.  Additionally, he is out of work and pursuing disability himself, not that those necessarily mean to give him a free pass on all irritability, but it recommends steering him toward acknowledging what impinges on him at the moment (pain mostly, feeling displaced secondarily).  Coached in recruiting some agreement from [place] to work on their communication habits, targeting looking for opportunities to take an "ouch" and check perceptions before reacting to them (e.g. "Did you mean that the way it sounded?"), and to look for times when Amanda [name] is out of his lane giving her  directions (e.g., don't do certain household tasks for fear she'll put herself in a pain flareup), precisely because it's the chance to take himself off his self-imposed hook to manage (and then martyr), by trust her to know and work with her own body.  For lightness and morale's sake, added a theme song -- Abba's "Take a Chance on Me".    Therapeutic modalities: Cognitive Behavioral Therapy, Solution-Oriented/Positive Psychology, Ego-Supportive, Faith-sensitive, and Assertiveness/Communication  Mental Status/Observations:  Appearance:   Casual     Behavior:  Appropriate  Motor:  Normal  Speech/Language:   Clear and Coherent  Affect:  Appropriate  Mood:  dysthymic and responsive , good bit lighter  Thought process:  normal  Thought content:    WNL  Sensory/Perceptual disturbances:    WNL  Orientation:  Fully oriented  Attention:  Good    Concentration:  Good  Memory:  WNL  Insight:    Variable  Judgment:   Good  Impulse Control:  Good   Risk Assessment: Danger to Self: No Self-injurious Behavior: No Danger to Others: No Physical Aggression / Violence: No Duty to Warn: No Access to Firearms a concern: No  Assessment of progress:  progressing well  Diagnosis:   ICD-10-CM   1. PTSD (post-traumatic stress disorder)  F43.10     2. Recurrent major depressive disorder, in partial remission (HCC)  F33.41     3. Generalized anxiety disorder  F41.1     4. Relationship problem between parent and child  Z62.820  5. Relationship problem between partners  Z63.0     6. Migraine syndrome  G43.909      Plan:  Trauma resolution -- Practice visualizing her medical recovery as reliably as she remembers her trauma, pay good attention to the things she is able to do that she couldn't in her former condition, and continue to live out capabilities.   Shame -- Self-affirm as normal her various emotional brain reactions, acknowledge as emotional but not pathological, refuse to shame them as  non-faithful.  Reorient to working out problems in living and learning better ways rather than moral failings. Marital tension -- Practice approaching Amanda [name] ready to (a) acknowledge the fear and pain that tends to make him hasty and frustrated, (b) assertively ask for attention and for him to slow down and hear her out, and (c) edit out her old pattern of complaining that would tend to come across to him as whining, with the assurance that even if she is being "the more mature one" at the moment, it will pay off in being heard better, and both of them will like the results.  Solicit agreement with H to take "ouch" moments and check intentions before reacting, and OK to kindly let him know he's out of his lane giving advice on pain and observing her limits, assuring him she knows her state better and will moderate for herself.  For morale and lighter atmosphere, use "Take a Chance on Me" if relevant.  Option to include Amanda [name] in therapy. Other recommendations/advice -- As may be noted above.  Continue to utilize previously learned skills ad lib. Medication compliance -- Maintain medication as prescribed and work faithfully with relevant prescriber(s) if any changes are desired or seem indicated. Crisis service -- Aware of call list and work-in appts.  Call the clinic on-call service, 988/hotline, 911, or present to [facility] or ER if any life-threatening psychiatric crisis. Followup -- Return for time as already scheduled.  Next scheduled visit with me 07/08/2023.  Next scheduled in this office 07/08/2023.  Maretta Shaper, PhD Delora Ferry, PhD LP Clinical Psychologist, [facility] [address] [place], [place] [address] [phone]

## 2023-07-08 ENCOUNTER — Ambulatory Visit: Payer: Medicare Other | Admitting: Psychiatry

## 2023-07-13 ENCOUNTER — Ambulatory Visit: Payer: Medicare Other | Admitting: Psychiatry

## 2023-08-01 ENCOUNTER — Other Ambulatory Visit: Payer: Self-pay | Admitting: Physician Assistant

## 2023-08-18 DIAGNOSIS — Z6827 Body mass index (BMI) 27.0-27.9, adult: Secondary | ICD-10-CM | POA: Diagnosis not present

## 2023-08-18 DIAGNOSIS — M5126 Other intervertebral disc displacement, lumbar region: Secondary | ICD-10-CM | POA: Diagnosis not present

## 2023-08-31 DIAGNOSIS — H43393 Other vitreous opacities, bilateral: Secondary | ICD-10-CM | POA: Diagnosis not present

## 2023-08-31 DIAGNOSIS — H2513 Age-related nuclear cataract, bilateral: Secondary | ICD-10-CM | POA: Diagnosis not present

## 2023-09-18 DIAGNOSIS — R21 Rash and other nonspecific skin eruption: Secondary | ICD-10-CM | POA: Diagnosis not present

## 2023-09-28 ENCOUNTER — Ambulatory Visit: Payer: Medicare Other | Admitting: Psychiatry

## 2023-09-28 DIAGNOSIS — F411 Generalized anxiety disorder: Secondary | ICD-10-CM

## 2023-09-28 DIAGNOSIS — G43909 Migraine, unspecified, not intractable, without status migrainosus: Secondary | ICD-10-CM

## 2023-09-28 DIAGNOSIS — Z630 Problems in relationship with spouse or partner: Secondary | ICD-10-CM | POA: Diagnosis not present

## 2023-09-28 DIAGNOSIS — F331 Major depressive disorder, recurrent, moderate: Secondary | ICD-10-CM | POA: Diagnosis not present

## 2023-09-28 NOTE — Progress Notes (Signed)
 Psychotherapy Progress Note [facility] Delora Ferry, PhD LP  Patient ID: Amanda [name] [facility])    MRN: [id number] Therapy format: Family therapy w/ patient -- accompanied by Ary Bitter Date: 09/28/2023      Start: 4:20p     Stop: 5:10p     Time Spent: 50 min Location: In-person   Session narrative (presenting needs, interim history, self-report of stressors and symptoms, applications of prior therapy, status changes, and interventions made in session) Over 3 months since last seen, H here on Pt's invitation to address strife between them.  Oriented Amanda [name] to rules of confidentiality and roles in the room.  Amanda [name] reports that he gets apprehensive about which [place] he's going to get, acknowledges he is leery of hearing complaints.  He can frequently feel she is ginning up a fight, actually, and finds himself responding to snappish moments.  Acknowledged for his own part that he can lose his temper, while Pt notes her own concern about being disbelieved (or invalidated) for what she's trying to share.  Checked a concern of hers whether Amanda [name] is tired of taking care of some things when she is down with pain and confirmed that he is accepting of the responsibilities.  Encouraged her to take him at his word and for it to be speakable -- without reciprocal judgment -- if he does need to express frustration (i.e., OK to say "ouch" and trust he's committed).  Disentangled the process of becoming chippy with each other, and encouraged perception/intent-checking and quality time however they can get it.  Interpreted Amanda [name]'s problem compulsively interjecting as fear she will not be taken seriously and challenged each to their part helping that -- she to practice trust and hold back, he to not take the bait and think of it as a nervous habit, not a subtle judgment.  Reinforced both of them passing up thoughts of being judged and looking for the more vulnerable and sympathetic thing (anxiety, pain) behind  the other's behavior and opportunities to collaborate on changing the process, which is the actual adversary here.  Agreed to practice those things.  Discussed scheduling, secured agreement for both to come back and check in on how they are doing as available, refreshed awareness of CA list and short notice spots.  Therapeutic modalities: Cognitive Behavioral Therapy, Solution-Oriented/Positive Psychology, Faith-sensitive, Assertiveness/Communication, and Interpersonal  Mental Status/Observations:  Appearance:   Casual     Behavior:  Appropriate  Motor:  Normal  Speech/Language:   Clear and Coherent  Affect:  Appropriate  Mood:  dysthymic  Thought process:  normal  Thought content:    WNL  Sensory/Perceptual disturbances:    WNL  Orientation:  Fully oriented  Attention:  Good    Concentration:  Fair  Memory:  WNL  Insight:    Variable  Judgment:   Good  Impulse Control:  Good   Risk Assessment: Danger to Self: No Self-injurious Behavior: No Danger to Others: No Physical Aggression / Violence: No Duty to Warn: No Access to Firearms a concern: No  Assessment of progress:  progressing  Diagnosis:   ICD-10-CM   1. Major depressive disorder, recurrent episode, moderate (HCC)  F33.1     2. Generalized anxiety disorder  F41.1     3. Relationship problem between partners  Z63.0     4. Migraine syndrome  G43.909      Plan:  Marital tension -- Practice approaching Amanda [name] ready to (a) acknowledge the fear and pain that tends to make  him hasty and frustrated, (b) assertively ask for attention and for him to slow down and hear her out, and (c) edit out her old pattern of complaining that would tend to come across to him as whining, with the assurance that even if she is being "the more mature one" at the moment, it will pay off in being heard better, and both of them will like the results.  Solicit agreement with H to take "ouch" moments and check intentions before reacting, and OK to  kindly let him know he's out of his lane giving advice on pain and observing her limits, assuring him she knows her state better and will moderate for herself.  For morale and lighter atmosphere, use "Take a Chance on Me" if relevant.  Option to include Amanda [name] in therapy. Trauma resolution -- As needed, practice visualizing her medical recovery as reliably as she remembers her trauma, pay good attention to the things she is able to do that she couldn't in her former condition, and continue to live out capabilities.   Shame -- Self-affirm as normal her various emotional brain reactions, acknowledge as emotional but not pathological, refuse to shame them as non-faithful.  Reorient to working out problems in living and learning better ways rather than moral failings. Other recommendations/advice -- As may be noted above.  Continue to utilize previously learned skills ad lib. Medication compliance -- Maintain medication as prescribed and work faithfully with relevant prescriber(s) if any changes are desired or seem indicated. Crisis service -- Aware of call list and work-in appts.  Call the clinic on-call service, 988/hotline, 911, or present to [facility] or ER if any life-threatening psychiatric crisis. Followup -- Return for time as available, recommend 2 wks up to 4.  Next scheduled visit with me Visit date not found.  Next scheduled in this office 11/07/2023.  Maretta Shaper, PhD Delora Ferry, PhD LP Clinical Psychologist, [facility] [address], [address] [place], [place] [address] [phone]

## 2023-10-18 DIAGNOSIS — K080 Exfoliation of teeth due to systemic causes: Secondary | ICD-10-CM | POA: Diagnosis not present

## 2023-10-25 ENCOUNTER — Other Ambulatory Visit: Payer: Self-pay | Admitting: Family

## 2023-10-25 DIAGNOSIS — K080 Exfoliation of teeth due to systemic causes: Secondary | ICD-10-CM | POA: Diagnosis not present

## 2023-10-25 DIAGNOSIS — R110 Nausea: Secondary | ICD-10-CM

## 2023-10-26 NOTE — Telephone Encounter (Signed)
 Please advise regarding refill. Does she need follow up? Thanks!

## 2023-10-30 ENCOUNTER — Other Ambulatory Visit: Payer: Self-pay | Admitting: Physician Assistant

## 2023-11-01 DIAGNOSIS — K080 Exfoliation of teeth due to systemic causes: Secondary | ICD-10-CM | POA: Diagnosis not present

## 2023-11-05 DIAGNOSIS — E860 Dehydration: Secondary | ICD-10-CM | POA: Diagnosis not present

## 2023-11-05 DIAGNOSIS — R112 Nausea with vomiting, unspecified: Secondary | ICD-10-CM | POA: Diagnosis not present

## 2023-11-05 DIAGNOSIS — T50905A Adverse effect of unspecified drugs, medicaments and biological substances, initial encounter: Secondary | ICD-10-CM | POA: Diagnosis not present

## 2023-11-07 ENCOUNTER — Ambulatory Visit: Payer: Medicare Other | Admitting: Physician Assistant

## 2023-11-07 DIAGNOSIS — K080 Exfoliation of teeth due to systemic causes: Secondary | ICD-10-CM | POA: Diagnosis not present

## 2023-11-16 ENCOUNTER — Ambulatory Visit (INDEPENDENT_AMBULATORY_CARE_PROVIDER_SITE_OTHER): Admitting: Psychiatry

## 2023-11-16 DIAGNOSIS — Z634 Disappearance and death of family member: Secondary | ICD-10-CM

## 2023-11-16 DIAGNOSIS — F331 Major depressive disorder, recurrent, moderate: Secondary | ICD-10-CM | POA: Diagnosis not present

## 2023-11-16 DIAGNOSIS — F411 Generalized anxiety disorder: Secondary | ICD-10-CM

## 2023-11-16 DIAGNOSIS — Z630 Problems in relationship with spouse or partner: Secondary | ICD-10-CM | POA: Diagnosis not present

## 2023-11-16 DIAGNOSIS — Z62820 Parent-biological child conflict: Secondary | ICD-10-CM

## 2023-11-16 NOTE — Progress Notes (Signed)
 Psychotherapy Progress Note [facility] Jodie Kendall, PhD LP  Patient ID: Amanda [name] [facility])    MRN: [id number] Therapy format: Family therapy w/ patient -- accompanied by VEAR Jodie Date: 11/16/2023      Start: 9:14a     Stop: 10:01a     Time Spent: 47 min Location: In-person   Session narrative (presenting needs, interim history, self-report of stressors and symptoms, applications of prior therapy, status changes, and interventions made in session) Both say things are considerably better.  Jodie is impressed with the tone-setting she's doing, not starting with complaining.  Jodie has been putting effort in to patience and calm, articulating it if he can't listen at the moment.  She has noticed a great reduction in him snapping.  Has been clearer he has her back, too, as asked re Albino and her contentious behavior.  Including sharing text messages where he asked the girls to be more considerate, a good help.  Decided they need to better manage awareness of events coming up, suggested to an online, mutual calendar.     Took up concerns about her working memory, the need to ask Jodie questions, and trepidation about doing so, lest she irritate him.  Recruited assurances that he won't be irritated, just good to secure his attention first, then ask, so it feels less abrupt and less tense/tentative.  Tearful this month with loss of a dear dog, and with Lauraine starting to restrict seeing the grandkids.  Is finding some freedom from agonizing about what her daughters think of her, thankfully, just sad to still get blamed.  News that Danita VEAR Sato asked for a divorce, too, so room to discount her reactions more as displacement.  Support/validation provided.   Therapeutic modalities: Cognitive Behavioral Therapy, Solution-Oriented/Positive Psychology, Ego-Supportive, and Assertiveness/Communication  Mental Status/Observations:  Appearance:   Casual     Behavior:  Appropriate   Motor:  Normal  Speech/Language:   Clear and Coherent  Affect:  Appropriate  Mood:  anxious, less  Thought process:  normal  Thought content:    WNL  Sensory/Perceptual disturbances:    WNL  Orientation:  Fully oriented  Attention:  Good    Concentration:  Good  Memory:  grossly intact  Insight:    Variable  Judgment:   Good  Impulse Control:  Good   Risk Assessment: Danger to Self: No Self-injurious Behavior: No Danger to Others: No Physical Aggression / Violence: No Duty to Warn: No Access to Firearms a concern: No  Assessment of progress:  progressing well  Diagnosis:   ICD-10-CM   1. Major depressive disorder, recurrent episode, moderate (HCC)  F33.1     2. Generalized anxiety disorder  F41.1     3. Relationship problem between partners  Z63.0     4. Bereavement (pet)  Z63.4     5. Relationship problem between parent and child  Z62.820      Plan:  Marital tension -- Practice approaching Jodie ready to (a) acknowledge the fear and pain that tends to make him hasty and frustrated, (b) assertively ask for attention and for him to slow down and hear her out, and (c) edit out her old pattern of complaining that would tend to come across to him as whining, with the assurance that even if she is being the more mature one at the moment, it will pay off in being heard better, and both of them will like the results.  Solicit agreement with H to take  ouch moments and check intentions before reacting, and OK to kindly let him know he's out of his lane giving advice on pain and observing her limits, assuring him she knows her state better and will moderate for herself.  For morale and lighter atmosphere, use Take a Chance on Me if relevant.  Option to include Jodie in therapy. Trauma resolution -- As needed, practice visualizing her medical recovery as reliably as she remembers her trauma, pay good attention to the things she is able to do that she couldn't in her former condition,  and continue to live out capabilities.   Shame -- Self-affirm as normal her various emotional brain reactions, acknowledge as emotional but not pathological, refuse to shame them as non-faithful.  Reorient to working out problems in living and learning better ways rather than moral failings. Other recommendations/advice -- As may be noted above.  Continue to utilize previously learned skills ad lib. Medication compliance -- Maintain medication as prescribed and work faithfully with relevant prescriber(s) if any changes are desired or seem indicated. Crisis service -- Aware of call list and work-in appts.  Call the clinic on-call service, 988/hotline, 911, or present to [facility] or ER if any life-threatening psychiatric crisis. Followup -- Return for time as already scheduled, time at discretion.  Next scheduled visit with me 11/29/2023.  Next scheduled in this office 11/29/2023.  Lamar Kendall, PhD Jodie Kendall, PhD LP Clinical Psychologist, [facility] [address], [address] [place], [place] [address] [phone]

## 2023-11-17 ENCOUNTER — Ambulatory Visit (INDEPENDENT_AMBULATORY_CARE_PROVIDER_SITE_OTHER): Admitting: Physician Assistant

## 2023-11-17 ENCOUNTER — Encounter: Payer: Self-pay | Admitting: Physician Assistant

## 2023-11-17 DIAGNOSIS — Z62820 Parent-biological child conflict: Secondary | ICD-10-CM | POA: Diagnosis not present

## 2023-11-17 DIAGNOSIS — F411 Generalized anxiety disorder: Secondary | ICD-10-CM

## 2023-11-17 DIAGNOSIS — G43909 Migraine, unspecified, not intractable, without status migrainosus: Secondary | ICD-10-CM

## 2023-11-17 DIAGNOSIS — F3341 Major depressive disorder, recurrent, in partial remission: Secondary | ICD-10-CM

## 2023-11-17 MED ORDER — GABAPENTIN 300 MG PO CAPS: ORAL_CAPSULE | ORAL | 1 refills | Status: DC

## 2023-11-17 MED ORDER — VENLAFAXINE HCL ER 150 MG PO CP24: 300.0000 mg | ORAL_CAPSULE | Freq: Every day | ORAL | 1 refills | Status: DC

## 2023-11-17 MED ORDER — CLONAZEPAM 1 MG PO TABS: ORAL_TABLET | ORAL | 5 refills | Status: DC

## 2023-11-17 NOTE — Progress Notes (Signed)
 [facility]  Patient ID: RHYLYNN PERDOMO,  MRN: [id number]  PCP: Faustina Hood, MD  Date of Evaluation: 11/17/2023 Time spent:20 minutes  Chief Complaint:  Chief Complaint   Anxiety; Depression; Follow-up    HISTORY/CURRENT STATUS: HPI  For routine med check.  Has had a rough month. Had to put one of their dogs to sleep. She had several back teeth pulled yesterday. Discord w/ her dtrs. She and husband see Dr. Caroleen Churn in counseling which is very helpful.  Energy and motivation are good.  No extreme sadness, tearfulness, or feelings of hopelessness.  Sleeps well most of the time.  RLS is well-treated.  ADLs and personal hygiene are normal.   Denies any changes in concentration, making decisions, or remembering things.  Appetite has not changed.  Weight is stable.  Anxiety is still an issue, Klonopin  helps.  Denies suicidal or homicidal thoughts.  Patient denies increased energy with decreased need for sleep, increased talkativeness, racing thoughts, impulsivity or risky behaviors, increased spending, increased libido, grandiosity, increased irritability or anger, paranoia, or hallucinations.  Denies dizziness, syncope, seizures, numbness, tingling, tremor, tics, unsteady gait, slurred speech, confusion. Denies muscle or joint pain, stiffness, or dystonia.  Individual Medical History/ Review of Systems: Changes? :No      Past medications for mental health diagnoses include: Effexor , Wellbutrin, Prozac  caused weight loss and GI, Relpax , Klonopin   Allergies: Cephalexin, Metoclopramide , Vancomycin , Wellbutrin [bupropion], and Aspirin  Current Medications:  Current Outpatient Medications:    aspirin-acetaminophen -caffeine (EXCEDRIN MIGRAINE) 250-250-65 MG tablet, Take 2 tablets by mouth every 6 (six) hours as needed for headache or migraine., Disp: , Rfl:    Calcium Carbonate-Vit D-Min (CALCIUM 1200 PO), Take 1,200 mg by mouth 2 (two) times daily. , Disp: , Rfl:     cholecalciferol (VITAMIN D3) 25 MCG (1000 UNIT) tablet, Take 1,000 Units by mouth daily., Disp: , Rfl:    eletriptan  (RELPAX ) 40 MG tablet, TAKE 1 TABLET BY MOUTH AT ONSET OF HEADACHE; MAY REPEAT 1 TABLET IN 2 HOURS IF NEEDED., Disp: 10 tablet, Rfl: 11   esomeprazole (NEXIUM) 20 MG capsule, Take 20 mg by mouth daily at 12 noon., Disp: , Rfl:    Magnesium  400 MG CAPS, Take 400 mg by mouth 2 (two) times daily. , Disp: , Rfl:    ondansetron  (ZOFRAN ) 8 MG tablet, TAKE 1 TABLET BY MOUTH EVERY 8 HOURS AS NEEDED FOR NAUSEA AND/OR VOMITING, Disp: 30 tablet, Rfl: 0   amoxicillin  (AMOXIL ) 500 MG capsule, Take 2 capsules (1,000 mg total) by mouth 3 (three) times daily. (Patient not taking: Reported on 11/17/2023), Disp: 180 capsule, Rfl: 5   clonazePAM  (KLONOPIN ) 1 MG tablet, TAKE 1 TABLET BY MOUTH 2 TIMES A DAY MAY TAKE ADDITIONAL TABLET IF NEEDED, Disp: 75 tablet, Rfl: 5   diphenoxylate -atropine  (LOMOTIL ) 2.5-0.025 MG tablet, Take 1 tablet by mouth 2 (two) times daily. (Patient not taking: Reported on 11/17/2023), Disp: 30 tablet, Rfl: 0   gabapentin  (NEURONTIN ) 300 MG capsule, TAKE THREE CAPSULES BY MOUTH EVERY NIGHT AT BEDTIME, Disp: 270 capsule, Rfl: 1   LINZESS  290 MCG CAPS capsule, Take 1 capsule (290 mcg total) by mouth daily. (Patient not taking: Reported on 11/17/2023), Disp: 30 capsule, Rfl: 0   oxyCODONE  10 MG TABS, Take 1 tablet (10 mg total) by mouth every 4 (four) hours as needed for severe pain ((score 7 to 10)). (Patient not taking: Reported on 05/09/2023), Disp: 35 tablet, Rfl: 0   potassium chloride  SA (KLOR-CON  M) 20 MEQ tablet, Take  1 tablet (20 mEq total) by mouth daily as needed (if more than 3 loose stools). (Patient not taking: Reported on 11/17/2023), Disp: 20 tablet, Rfl: 0   promethazine  (PHENERGAN ) 25 MG tablet, Take 0.5-1 tablets (12.5-25 mg total) by mouth every 6 (six) hours as needed for nausea or vomiting. (Patient not taking: Reported on 11/17/2023), Disp: 30 tablet, Rfl: 1    saccharomyces boulardii (FLORASTOR) 250 MG capsule, Take 1 capsule (250 mg total) by mouth 2 (two) times daily. (Patient not taking: Reported on 11/17/2023), Disp: 60 capsule, Rfl: 0   venlafaxine  XR (EFFEXOR -XR) 150 MG 24 hr capsule, Take 2 capsules (300 mg total) by mouth daily., Disp: 180 capsule, Rfl: 1 Medication Side Effects: none  Family Medical/ Social History: Changes? Had to put one of their dogs to sleep.   MENTAL HEALTH EXAM:  There were no vitals taken for this visit.There is no height or weight on file to calculate BMI.  General Appearance: Casual and Well Groomed  Eye Contact:  Good  Speech:  Clear and Coherent and Normal Rate  Volume:  Normal  Mood:  Euthymic  Affect:  Congruent  Thought Process:  Goal Directed and Descriptions of Associations: Circumstantial  Orientation:  Full (Time, Place, and Person)  Thought Content: Logical   Suicidal Thoughts:  No  Homicidal Thoughts:  No  Memory:  Immediate;   Fair Recent;   Fair Remote;   Good  Judgement:  Good  Insight:  Good  Psychomotor Activity:  Normal  Concentration:  Concentration: Good and Attention Span: Fair  Recall:  Good  Fund of Knowledge: Good  Language: Good  Assets:  Communication Skills Desire for Improvement Financial Resources/Insurance Housing Resilience Social Support Transportation  ADL's:  Intact  Cognition: WNL  Prognosis:  Good   DIAGNOSES:    ICD-10-CM   1. Recurrent major depressive disorder, in partial remission (HCC)  F33.41     2. Generalized anxiety disorder  F41.1     3. Migraine syndrome  G43.909     4. Relationship problem between parent and child  Z62.820      Receiving Psychotherapy: Yes    Dr. Jonelle Neri Mitchum    RECOMMENDATIONS:  PDMP was reviewed.  Klonopin  filled 11/02/2023.  Gabapentin  filled 10/30/2023. I provided 20 minutes of face to face time during this encounter, including time spent before and after the visit in records review, medical decision making, counseling  pertinent to today's visit, and charting.   She's doing well as far as her mental health meds go so no changes will be made.   Continue Klonopin  1 mg, 1 p.o. 3 times daily as needed but limit to 2.5 mg a day. Continue Relpax  40 mg, 1 p.o. daily as needed migraine, may repeat in 2 hours as needed.  Neurology now prescribes. Continue gabapentin  300 mg, 3 p.o. nightly. Continue Zofran  8 mg, 1 p.o. every 8 hours as needed nausea and vomiting. Continue Phenergan  12.5 mg, 1 p.o. every 8 hours as needed nausea and vomiting. Continue Effexor  XR 150 mg,  2 po qd Continue therapy with Dr. Delora Ferry. Return in 3 months.  Marvia Slocumb, PA-C

## 2023-11-29 ENCOUNTER — Ambulatory Visit: Admitting: Psychiatry

## 2023-11-30 ENCOUNTER — Ambulatory Visit: Admitting: Psychiatry

## 2023-11-30 DIAGNOSIS — G43711 Chronic migraine without aura, intractable, with status migrainosus: Secondary | ICD-10-CM | POA: Diagnosis not present

## 2023-11-30 DIAGNOSIS — R112 Nausea with vomiting, unspecified: Secondary | ICD-10-CM | POA: Diagnosis not present

## 2023-12-06 DIAGNOSIS — H2513 Age-related nuclear cataract, bilateral: Secondary | ICD-10-CM | POA: Diagnosis not present

## 2023-12-06 DIAGNOSIS — H2512 Age-related nuclear cataract, left eye: Secondary | ICD-10-CM | POA: Diagnosis not present

## 2023-12-06 DIAGNOSIS — H25013 Cortical age-related cataract, bilateral: Secondary | ICD-10-CM | POA: Diagnosis not present

## 2023-12-06 DIAGNOSIS — H25043 Posterior subcapsular polar age-related cataract, bilateral: Secondary | ICD-10-CM | POA: Diagnosis not present

## 2023-12-15 ENCOUNTER — Other Ambulatory Visit: Payer: Self-pay

## 2023-12-15 ENCOUNTER — Emergency Department (HOSPITAL_BASED_OUTPATIENT_CLINIC_OR_DEPARTMENT_OTHER)

## 2023-12-15 ENCOUNTER — Other Ambulatory Visit: Payer: Self-pay | Admitting: Family Medicine

## 2023-12-15 ENCOUNTER — Emergency Department (HOSPITAL_BASED_OUTPATIENT_CLINIC_OR_DEPARTMENT_OTHER): Admitting: Radiology

## 2023-12-15 ENCOUNTER — Encounter (HOSPITAL_BASED_OUTPATIENT_CLINIC_OR_DEPARTMENT_OTHER): Payer: Self-pay

## 2023-12-15 ENCOUNTER — Observation Stay (HOSPITAL_BASED_OUTPATIENT_CLINIC_OR_DEPARTMENT_OTHER)
Admission: EM | Admit: 2023-12-15 | Discharge: 2023-12-16 | Discharge status: Against Medical Advice | Attending: Internal Medicine | Admitting: Internal Medicine

## 2023-12-15 DIAGNOSIS — F419 Anxiety disorder, unspecified: Secondary | ICD-10-CM | POA: Diagnosis not present

## 2023-12-15 DIAGNOSIS — D500 Iron deficiency anemia secondary to blood loss (chronic): Principal | ICD-10-CM | POA: Insufficient documentation

## 2023-12-15 DIAGNOSIS — Z48811 Encounter for surgical aftercare following surgery on the nervous system: Secondary | ICD-10-CM | POA: Insufficient documentation

## 2023-12-15 DIAGNOSIS — Z0000 Encounter for general adult medical examination without abnormal findings: Secondary | ICD-10-CM | POA: Diagnosis not present

## 2023-12-15 DIAGNOSIS — G43709 Chronic migraine without aura, not intractable, without status migrainosus: Secondary | ICD-10-CM | POA: Insufficient documentation

## 2023-12-15 DIAGNOSIS — Z7982 Long term (current) use of aspirin: Secondary | ICD-10-CM | POA: Diagnosis not present

## 2023-12-15 DIAGNOSIS — R0609 Other forms of dyspnea: Secondary | ICD-10-CM | POA: Diagnosis not present

## 2023-12-15 DIAGNOSIS — F32A Depression, unspecified: Secondary | ICD-10-CM | POA: Diagnosis not present

## 2023-12-15 DIAGNOSIS — E876 Hypokalemia: Secondary | ICD-10-CM | POA: Diagnosis not present

## 2023-12-15 DIAGNOSIS — Z5321 Procedure and treatment not carried out due to patient leaving prior to being seen by health care provider: Secondary | ICD-10-CM | POA: Insufficient documentation

## 2023-12-15 DIAGNOSIS — R7989 Other specified abnormal findings of blood chemistry: Secondary | ICD-10-CM | POA: Diagnosis not present

## 2023-12-15 DIAGNOSIS — G2581 Restless legs syndrome: Secondary | ICD-10-CM | POA: Diagnosis not present

## 2023-12-15 DIAGNOSIS — Z9889 Other specified postprocedural states: Secondary | ICD-10-CM | POA: Diagnosis not present

## 2023-12-15 DIAGNOSIS — R0602 Shortness of breath: Secondary | ICD-10-CM

## 2023-12-15 DIAGNOSIS — D649 Anemia, unspecified: Principal | ICD-10-CM | POA: Diagnosis present

## 2023-12-15 DIAGNOSIS — F418 Other specified anxiety disorders: Secondary | ICD-10-CM | POA: Diagnosis present

## 2023-12-15 DIAGNOSIS — E782 Mixed hyperlipidemia: Secondary | ICD-10-CM | POA: Diagnosis not present

## 2023-12-15 LAB — CBC
HCT: 21.8 % — ABNORMAL LOW (ref 36.0–46.0)
Hemoglobin: 6 g/dL — CL (ref 12.0–15.0)
MCH: 19 pg — ABNORMAL LOW (ref 26.0–34.0)
MCHC: 27.5 g/dL — ABNORMAL LOW (ref 30.0–36.0)
MCV: 69 fL — ABNORMAL LOW (ref 80.0–100.0)
Platelets: 512 10*3/uL — ABNORMAL HIGH (ref 150–400)
RBC: 3.16 MIL/uL — ABNORMAL LOW (ref 3.87–5.11)
RDW: 17.6 % — ABNORMAL HIGH (ref 11.5–15.5)
WBC: 8.4 10*3/uL (ref 4.0–10.5)
nRBC: 0 % (ref 0.0–0.2)

## 2023-12-15 LAB — BASIC METABOLIC PANEL WITH GFR
Anion gap: 14 (ref 5–15)
BUN: 18 mg/dL (ref 8–23)
CO2: 22 mmol/L (ref 22–32)
Calcium: 9.4 mg/dL (ref 8.9–10.3)
Chloride: 103 mmol/L (ref 98–111)
Creatinine, Ser: 1.17 mg/dL — ABNORMAL HIGH (ref 0.44–1.00)
GFR, Estimated: 52 mL/min — ABNORMAL LOW (ref >60)
Glucose, Bld: 91 mg/dL (ref 70–99)
Potassium: 3.3 mmol/L — ABNORMAL LOW (ref 3.5–5.1)
Sodium: 139 mmol/L (ref 135–145)

## 2023-12-15 LAB — IRON AND TIBC
Iron: 9 ug/dL — ABNORMAL LOW (ref 28–170)
Saturation Ratios: 2 % — ABNORMAL LOW (ref 10.4–31.8)
TIBC: 482 ug/dL — ABNORMAL HIGH (ref 250–450)
UIBC: 473 ug/dL

## 2023-12-15 LAB — FERRITIN: Ferritin: <3 ng/mL — ABNORMAL LOW (ref 11–307)

## 2023-12-15 LAB — TROPONIN T, HIGH SENSITIVITY: Troponin T High Sensitivity: <15 ng/L (ref <19)

## 2023-12-15 LAB — OCCULT BLOOD X 1 CARD TO LAB, STOOL: Fecal Occult Bld: NEGATIVE

## 2023-12-15 LAB — VITAMIN B12: Vitamin B-12: 376 pg/mL (ref 180–914)

## 2023-12-15 LAB — PROTIME-INR
INR: 1 (ref 0.8–1.2)
Prothrombin Time: 13.4 s (ref 11.4–15.2)

## 2023-12-15 LAB — TROPONIN I (HIGH SENSITIVITY): Troponin I (High Sensitivity): 4 ng/L (ref <18)

## 2023-12-15 LAB — FOLATE: Folate: 16.8 ng/mL (ref >5.9)

## 2023-12-15 LAB — PRO BRAIN NATRIURETIC PEPTIDE: Pro Brain Natriuretic Peptide: 87 pg/mL (ref <300.0)

## 2023-12-15 MED ORDER — SODIUM CHLORIDE 0.9% FLUSH
3.0000 mL | Freq: Two times a day (BID) | INTRAVENOUS | Status: DC
  Administered 2023-12-16 (×2): 3 mL via INTRAVENOUS

## 2023-12-15 MED ORDER — ONDANSETRON HCL 4 MG PO TABS
4.0000 mg | ORAL_TABLET | Freq: Four times a day (QID) | ORAL | Status: DC | PRN
  Administered 2023-12-16: 4 mg via ORAL
  Filled 2023-12-15: qty 1

## 2023-12-15 MED ORDER — PANTOPRAZOLE SODIUM 40 MG PO TBEC
40.0000 mg | DELAYED_RELEASE_TABLET | Freq: Every day | ORAL | Status: DC
  Administered 2023-12-16: 40 mg via ORAL
  Filled 2023-12-15: qty 1

## 2023-12-15 MED ORDER — VENLAFAXINE HCL ER 150 MG PO CP24
300.0000 mg | ORAL_CAPSULE | Freq: Every day | ORAL | Status: DC
  Administered 2023-12-16: 300 mg via ORAL
  Filled 2023-12-15: qty 2

## 2023-12-15 MED ORDER — POTASSIUM CHLORIDE 20 MEQ PO PACK
40.0000 meq | PACK | Freq: Once | ORAL | Status: AC
  Administered 2023-12-15: 40 meq via ORAL
  Filled 2023-12-15: qty 2

## 2023-12-15 MED ORDER — SODIUM CHLORIDE 0.9% IV SOLUTION: Freq: Once | INTRAVENOUS | Status: DC

## 2023-12-15 MED ORDER — ONDANSETRON HCL 4 MG/2ML IJ SOLN
4.0000 mg | Freq: Four times a day (QID) | INTRAMUSCULAR | Status: DC | PRN
  Administered 2023-12-16 (×2): 4 mg via INTRAVENOUS
  Filled 2023-12-15 (×2): qty 2

## 2023-12-15 MED ORDER — CLONAZEPAM 1 MG PO TABS
1.0000 mg | ORAL_TABLET | Freq: Two times a day (BID) | ORAL | Status: DC | PRN
  Administered 2023-12-16: 1 mg via ORAL
  Filled 2023-12-15: qty 1

## 2023-12-15 MED ORDER — ELETRIPTAN HYDROBROMIDE 40 MG PO TABS
40.0000 mg | ORAL_TABLET | Freq: Every day | ORAL | Status: DC | PRN
  Administered 2023-12-16: 40 mg via ORAL
  Filled 2023-12-15 (×2): qty 1

## 2023-12-15 MED ORDER — ONDANSETRON HCL 4 MG/2ML IJ SOLN
4.0000 mg | Freq: Four times a day (QID) | INTRAMUSCULAR | Status: DC | PRN
  Administered 2023-12-15: 4 mg via INTRAVENOUS
  Filled 2023-12-15: qty 2

## 2023-12-15 MED ORDER — SENNOSIDES-DOCUSATE SODIUM 8.6-50 MG PO TABS: 1.0000 | ORAL_TABLET | Freq: Every evening | ORAL | Status: DC | PRN

## 2023-12-15 MED ORDER — POTASSIUM CHLORIDE 10 MEQ/100ML IV SOLN
10.0000 meq | INTRAVENOUS | Status: AC
  Administered 2023-12-15 (×2): 10 meq via INTRAVENOUS
  Filled 2023-12-15 (×2): qty 100

## 2023-12-15 MED ORDER — GABAPENTIN 300 MG PO CAPS
900.0000 mg | ORAL_CAPSULE | Freq: Every day | ORAL | Status: DC
  Administered 2023-12-16: 900 mg via ORAL
  Filled 2023-12-15: qty 3

## 2023-12-15 MED ORDER — IOHEXOL 350 MG/ML SOLN
75.0000 mL | Freq: Once | INTRAVENOUS | Status: AC | PRN
  Administered 2023-12-15: 100 mL via INTRAVENOUS

## 2023-12-15 MED ORDER — ACETAMINOPHEN 500 MG PO TABS
1000.0000 mg | ORAL_TABLET | Freq: Four times a day (QID) | ORAL | Status: DC | PRN
  Administered 2023-12-16: 1000 mg via ORAL
  Filled 2023-12-15: qty 2

## 2023-12-15 MED ORDER — ACETAMINOPHEN 650 MG RE SUPP: 650.0000 mg | Freq: Four times a day (QID) | RECTAL | Status: DC | PRN

## 2023-12-15 NOTE — Hospital Course (Addendum)
 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  Patient found to have symptomatic anemia with hemoglobin 6.0.  FOBT is negative.  CTA chest was also negative.   Patient received IV iron and PRBC transfusion in the hospital. Eventually the evening she decided to leave AGAINST MEDICAL ADVICE  Assessment & Plan:  Principal Problem:   Symptomatic anemia Active Problems:   Hypokalemia   Depression with anxiety   Symptomatic anemia with iron deficiency: Hemoglobin 6.0 on admission compared to previous baseline ~10.  Noted to have severe iron deficiency anemia.  B12 and folate are normal.  2 units PRBC ordered.  Will give IV iron today followed by p.o. starting tomorrow   Hypokalemia: As needed repletion   Depression/anxiety: Continue Effexor  XR and home Klonopin  1 mg twice daily as needed.   Chronic migraines: Continue home Relpax .   Restless leg syndrome: Continue gabapentin .   DVT prophylaxis: SCDs Start: 12/15/23 2324    Code Status: Limited: Do not attempt resuscitation (DNR) -DNR-LIMITED -Do Not Intubate/DNI  Family Communication:   Ongoing management for symptomatic anemia    Subjective: Seen at bedside.  Patient states she has had anemia in the past.  She has also had colonoscopy with [facility] back in 2023 and it was essentially negative. She denies any obvious source of bleeding.  She also had hysterectomy.  At this time she would like to just proceed with occasional IV iron infusion with p.o. supplements.   Examination:  General exam: Appears calm and comfortable  Respiratory system: Clear to auscultation. Respiratory effort normal. Cardiovascular system: S1 & S2 heard, RRR. No JVD, murmurs, rubs, gallops or clicks. No pedal edema. Gastrointestinal system:  Abdomen is nondistended, soft and nontender. No organomegaly or masses felt. Normal bowel sounds heard. Central nervous system: Alert and oriented. No focal neurological deficits. Extremities: Symmetric 5 x 5 power. Skin: No rashes, lesions or ulcers Psychiatry: Judgement and insight appear normal. Mood & affect appropriate.

## 2023-12-15 NOTE — ED Triage Notes (Addendum)
 Patient arrives ambulatory to the ED with complaints of shortness of breath x1 month. Patient was seen at her PCP today for the same and scheduled for a CT scan. She could not get the scan due to needing prior authorization.

## 2023-12-15 NOTE — ED Notes (Signed)
 [facility] at bedside

## 2023-12-15 NOTE — ED Notes (Signed)
 Patient transported to CT

## 2023-12-15 NOTE — ED Provider Notes (Addendum)
 [place] AT [facility] Provider Note   CSN: [id number] Arrival date & time: 12/15/23  1647     History  Chief Complaint  Patient presents with   Shortness of Breath    Amanda [name] is a 63 y.o. female.  She was sent in by her primary care doctor for evaluation of shortness of breath.  She said she has been very short of breath for the last month.  She thinks it started after having dental work.  She has chronic migraine headaches and vomits a lot.  She takes a moderate amount of NSAIDs due to her headaches.  She has not seen any melena or vomiting blood.  She said she does not eat much iron-containing food.  No cough.  No chest pain.  No fevers or chills.  The history is provided by the patient.  Shortness of Breath Severity:  Moderate Onset quality:  Gradual Duration:  1 month Timing:  Constant Progression:  Unchanged Chronicity:  New Relieved by:  Nothing Worsened by:  Activity Ineffective treatments:  Rest Associated symptoms: headaches and vomiting   Associated symptoms: no abdominal pain, no chest pain, no cough, no fever and no hemoptysis   Risk factors: no tobacco use        Home Medications Prior to Admission medications   Medication Sig Start Date End Date Taking? Authorizing Provider  amoxicillin  (AMOXIL ) 500 MG capsule Take 2 capsules (1,000 mg total) by mouth 3 (three) times daily. Patient not taking: Reported on 11/17/2023 02/09/23   Calone, Gregory D, FNP  aspirin-acetaminophen -caffeine (EXCEDRIN MIGRAINE) 250-250-65 MG tablet Take 2 tablets by mouth every 6 (six) hours as needed for headache or migraine.    [provider]  Calcium Carbonate-Vit D-Min (CALCIUM 1200 PO) Take 1,200 mg by mouth 2 (two) times daily.     [provider]  cholecalciferol (VITAMIN D3) 25 MCG (1000 UNIT) tablet Take 1,000 Units by mouth daily.    [provider]  clonazePAM  (KLONOPIN ) 1 MG tablet TAKE 1 TABLET BY MOUTH 2  TIMES A DAY MAY TAKE ADDITIONAL TABLET IF NEEDED 11/17/23   Marvia Slocumb T, PA-C  diphenoxylate -atropine  (LOMOTIL ) 2.5-0.025 MG tablet Take 1 tablet by mouth 2 (two) times daily. Patient not taking: Reported on 11/17/2023 10/09/22   Gherghe, Costin M, MD  eletriptan  (RELPAX ) 40 MG tablet TAKE 1 TABLET BY MOUTH AT ONSET OF HEADACHE; MAY REPEAT 1 TABLET IN 2 HOURS IF NEEDED. 08/02/23   Marvia Slocumb T, PA-C  esomeprazole (NEXIUM) 20 MG capsule Take 20 mg by mouth daily at 12 noon.    [provider]  gabapentin  (NEURONTIN ) 300 MG capsule TAKE THREE CAPSULES BY MOUTH EVERY NIGHT AT BEDTIME 11/17/23   Hurst, Rogene Claude, PA-C  LINZESS  290 MCG CAPS capsule Take 1 capsule (290 mcg total) by mouth daily. Patient not taking: Reported on 11/17/2023 01/19/23   Calone, Gregory D, FNP  Magnesium  400 MG CAPS Take 400 mg by mouth 2 (two) times daily.     [provider]  ondansetron  (ZOFRAN ) 8 MG tablet TAKE 1 TABLET BY MOUTH EVERY 8 HOURS AS NEEDED FOR NAUSEA AND/OR VOMITING 10/26/23   Calone, Gregory D, FNP  oxyCODONE  10 MG TABS Take 1 tablet (10 mg total) by mouth every 4 (four) hours as needed for severe pain ((score 7 to 10)). Patient not taking: Reported on 05/09/2023 10/09/22   Gherghe, Costin M, MD  potassium chloride  SA (KLOR-CON  M) 20 MEQ tablet Take 1 tablet (20  mEq total) by mouth daily as needed (if more than 3 loose stools). Patient not taking: Reported on 11/17/2023 10/09/22   Gherghe, Costin M, MD  promethazine  (PHENERGAN ) 25 MG tablet Take 0.5-1 tablets (12.5-25 mg total) by mouth every 6 (six) hours as needed for nausea or vomiting. Patient not taking: Reported on 11/17/2023 02/09/23   Calone, Gregory D, FNP  saccharomyces boulardii (FLORASTOR) 250 MG capsule Take 1 capsule (250 mg total) by mouth 2 (two) times daily. Patient not taking: Reported on 11/17/2023 10/09/22   Gherghe, Costin M, MD  venlafaxine  XR (EFFEXOR -XR) 150 MG 24 hr capsule Take 2 capsules (300 mg total) by mouth daily. 11/17/23    Marvia Slocumb T, PA-C      Allergies    Cephalexin, Metoclopramide , Vancomycin , Wellbutrin [bupropion], and Aspirin    Review of Systems   Review of Systems  Constitutional:  Negative for fever.  Respiratory:  Positive for shortness of breath. Negative for cough and hemoptysis.   Cardiovascular:  Negative for chest pain.  Gastrointestinal:  Positive for vomiting. Negative for abdominal pain.  Musculoskeletal:  Positive for back pain.  Neurological:  Positive for headaches.    Physical Exam Updated Vital Signs BP 133/76 (BP Location: Right Arm)   Pulse 92   Temp 98 F (36.7 C)   Resp 20   Ht 5\' 7"  (1.702 m)   Wt 79.4 kg   SpO2 100%   BMI 27.42 kg/m  Physical Exam Vitals and nursing note reviewed.  Constitutional:      General: She is not in acute distress.    Appearance: Normal appearance. She is well-developed.  HENT:     Head: Normocephalic and atraumatic.  Eyes:     Conjunctiva/sclera: Conjunctivae normal.  Cardiovascular:     Rate and Rhythm: Normal rate and regular rhythm.     Heart sounds: No murmur heard. Pulmonary:     Effort: Pulmonary effort is normal. No respiratory distress.     Breath sounds: Normal breath sounds. No stridor. No wheezing.  Abdominal:     Palpations: Abdomen is soft.     Tenderness: There is no abdominal tenderness. There is no guarding or rebound.  Musculoskeletal:        General: No tenderness or deformity. Normal range of motion.     Cervical back: Neck supple.  Skin:    General: Skin is warm and dry.  Neurological:     General: No focal deficit present.     Mental Status: She is alert.     GCS: GCS eye subscore is 4. GCS verbal subscore is 5. GCS motor subscore is 6.     ED Results / Procedures / Treatments   Labs (all labs ordered are listed, but only abnormal results are displayed) Labs Reviewed  BASIC METABOLIC PANEL WITH GFR - Abnormal; Notable for the following components:      Result Value   Potassium 3.3 (*)     Creatinine, Ser 1.17 (*)    GFR, Estimated 52 (*)    All other components within normal limits  CBC - Abnormal; Notable for the following components:   RBC 3.16 (*)    Hemoglobin 6.0 (*)    HCT 21.8 (*)    MCV 69.0 (*)    MCH 19.0 (*)    MCHC 27.5 (*)    RDW 17.6 (*)    Platelets 512 (*)    All other components within normal limits  IRON AND TIBC - Abnormal; Notable for the following  components:   Iron 9 (*)    TIBC 482 (*)    Saturation Ratios 2 (*)    All other components within normal limits  FERRITIN - Abnormal; Notable for the following components:   Ferritin <3 (*)    All other components within normal limits  CBC - Abnormal; Notable for the following components:   RBC 2.99 (*)    Hemoglobin 5.7 (*)    HCT 21.2 (*)    MCV 70.9 (*)    MCH 19.1 (*)    MCHC 26.9 (*)    RDW 17.6 (*)    Platelets 443 (*)    nRBC 0.3 (*)    All other components within normal limits  BASIC METABOLIC PANEL WITH GFR - Abnormal; Notable for the following components:   CO2 20 (*)    Glucose, Bld 131 (*)    Calcium 8.4 (*)    All other components within normal limits  PRO BRAIN NATRIURETIC PEPTIDE  PROTIME-INR  OCCULT BLOOD X 1 CARD TO LAB, STOOL  VITAMIN B12  FOLATE  OCCULT BLOOD X 1 CARD TO LAB, STOOL  HIV ANTIBODY (ROUTINE TESTING W REFLEX)  OCCULT BLOOD X 1 CARD TO LAB, STOOL  TSH  TYPE AND SCREEN  PREPARE RBC (CROSSMATCH)  ABO/RH  TROPONIN T, HIGH SENSITIVITY  TROPONIN I (HIGH SENSITIVITY)    EKG None  Radiology DG Chest 2 View Result Date: 12/15/2023 CLINICAL DATA:  Shortness of breath. EXAM: CHEST - 2 VIEW COMPARISON:  09/26/2022 FINDINGS: The cardiomediastinal contours are normal. The lungs are clear. Pulmonary vasculature is normal. No consolidation, pleural effusion, or pneumothorax. No acute osseous abnormalities are seen. IMPRESSION: No active cardiopulmonary disease. Electronically Signed   By: Chadwick Colonel M.D.   On: 12/15/2023 18:26    Procedures Procedures     Medications Ordered in ED Medications  potassium chloride  10 mEq in 100 mL IVPB (10 mEq Intravenous New Bag/Given 12/15/23 1948)  ondansetron  (ZOFRAN ) injection 4 mg (has no administration in time range)  iohexol (OMNIPAQUE) 350 MG/ML injection 75 mL (100 mLs Intravenous Contrast Given 12/15/23 1918)    ED Course/ Medical Decision Making/ A&P Clinical Course as of 12/15/23 1959  Thu Dec 15, 2023  1749 EKG not crossing in epic.  Sinus rhythm no acute ST-T changes. [MB]  1814 Rectal exam done with nurse Arlene Lacy chaperone.  Has some old external hemorrhoids.  Nontender no gross bleeding.  Sample sent to lab for guaiac [MB]  1815 Chest x-ray interpreted by me as no acute infiltrate.  Awaiting radiology reading. [MB]  1908 Discussed with Dr. Melvinia Stager [facility] hospitalist who asked if we could get a CT angio as the patient had an outpatient D-dimer that was mildly elevated.  She will put her in for an admission bed regardless. [MB]    Clinical Course User Index [MB] Tonya Fredrickson, MD                                 Medical Decision Making Amount and/or Complexity of Data Reviewed Labs: ordered. Radiology: ordered.  Risk Prescription drug management. Decision regarding hospitalization.   This patient complains of chest breath; this involves an extensive number of treatment Options and is a complaint that carries with it a high risk of complications and morbidity. The differential includes ACS, PE, pneumothorax, pneumonia, anemia  I ordered, reviewed and interpreted labs, which included CBC with critically low hemoglobin, flat troponin and  BMP  I ordered imaging studies which included x-ray and CT angio and I independently    visualized and interpreted imaging which showed no acute findings on x-ray.  CT angio pending at time of transfer Previous records obtained and reviewed in epic including patient's PCP note having mildly elevated dimer and low hemoglobin I consulted [facility]  hospitalist Dr. Melvinia Stager and discussed lab and imaging findings and discussed disposition.  Cardiac monitoring reviewed, sinus rhythm Social determinants considered, no significant barriers Critical Interventions: None  After the interventions stated above, I reevaluated the patient and found patient still to be symptomatic with low hemoglobin Admission and further testing considered, patient would benefit from mission to the hospital for further treatment including transfusion and workup of her anemia.         Final Clinical Impression(s) / ED Diagnoses Final diagnoses:  Symptomatic anemia    Rx / DC Orders ED Discharge Orders     None         Tonya Fredrickson, MD 12/15/23 2001    Tonya Fredrickson, MD 12/16/23 [phone]

## 2023-12-15 NOTE — H&P (Signed)
 History and Physical    Amanda [name] ZOX:[id number] DOB: Sep 19, 1960 DOA: 12/15/2023  PCP: Faustina Hood, MD  Patient coming from: Home  I have personally briefly reviewed patient's old medical records in [facility]  Chief Complaint: Shortness of breath  HPI: Amanda [name] is a 63 y.o. female with medical history significant for chronic normocytic anemia, chronic migraines, restless leg syndrome, depression/anxiety, s/p L4-5 microdiscectomy complicated by postsurgical infection with MSSA bacteremia/meningitis (09/2022) who presented to the ED for evaluation of progressive dyspnea.  Patient states that over the last month she has been having progressively worsening dyspnea.  Dyspnea is mostly with activity and yesterday has progressed to the point where she becomes short of breath just when she gets up and starts walking.  She also says she has been feeling short of breath when she is talking.  She has not had any cough or chest pain.  She denies fevers, chills, diaphoresis, nausea, vomiting, abdominal pain.  She says that she does have some small spotting bleeding from hemorrhoids intermittently but has not seen any other obvious bleeding.  She denies epistaxis, hemoptysis, hematemesis, hematuria, abnormal uterine bleeding, hematochezia, or melena.  She says she has had normal colonoscopy in the past.  She says appetite has been poor due to change in taste and she does not believe she is getting enough nutrients in her diet.  She does suffer from chronic migraines and does occasionally take NSAIDs as well as Excedrin for management.  [facility] ED Course  Labs/Imaging on admission: I have personally reviewed following labs and imaging studies.  Initial vitals showed BP 133/76, pulse 92, RR 20, temp 98.0 F, SpO2 100% on room air.  Labs showed hemoglobin 6.0, hematocrit 21.8, MCV 69, RDW 17.6, platelets 512, WBC 8.4, sodium 139, potassium 3.3, bicarb 22, BUN 18,  creatinine 1.17, proBNP 87, troponin T <15, INR 1.0.  FOBT negative.  CTA chest negative for PE or other acute intrathoracic process.  Patient was given IV K 10 mEq x 2.  The hospitalist service was consulted to admit.  Review of Systems: All systems reviewed and are negative except as documented in history of present illness above.   Past Medical History:  Diagnosis Date   Allergic reaction 10/27/2022   Altered mental status 04/19/2016   Depression    History of total hysterectomy    Hyperglycemia 04/19/2016   Intractable migraine without aura 12/04/2012   Migraine with intractable migraine    PONV (postoperative nausea and vomiting)    Restless leg syndrome     Past Surgical History:  Procedure Laterality Date   MAXIMUM ACCESS (MAS)POSTERIOR LUMBAR INTERBODY FUSION (PLIF) 1 LEVEL  10/02/2022   Procedure: POSTERIOR LUMBAR INTERBODY FUSION Lumbar Four - Five;  Surgeon: Audie Bleacher, MD;  Location: [facility] OR;  Service: Neurosurgery;;   TEE WITHOUT CARDIOVERSION N/A 10/01/2022   Procedure: TRANSESOPHAGEAL ECHOCARDIOGRAM (TEE);  Surgeon: Alwin Baars, DO;  Location: [facility] ENDOSCOPY;  Service: Cardiovascular;  Laterality: N/A;   TOTAL ABDOMINAL HYSTERECTOMY     WOUND EXPLORATION N/A 10/02/2022   Procedure: WOUND EXPLORATION;  Surgeon: Audie Bleacher, MD;  Location: [facility] OR;  Service: Neurosurgery;  Laterality: N/A;    Social History: Social History   Tobacco Use   Smoking status: Never   Smokeless tobacco: Never  Substance Use Topics   Alcohol use: Yes    Alcohol/week: 1.0 standard drink of alcohol    Types: 1 Glasses of wine per week    Comment: one a  year   Drug use: No   Allergies  Allergen Reactions   Cephalexin Diarrhea, Itching and Swelling   Metoclopramide  Other (See Comments)    Irritates the restless legs   Vancomycin  Other (See Comments)    "Muscle pain and hip pain"    Wellbutrin [Bupropion]     GERD   Aspirin Other (See Comments)    Makes her ears ring     Family History  Problem Relation Age of Onset   Dementia Mother    Lung cancer Father    Migraines Sister    Migraines Sister    Migraines Brother    Hypothyroidism Sister    Hypothyroidism Sister    Hypothyroidism Sister      Prior to Admission medications   Medication Sig Start Date End Date Taking? Authorizing Provider  amoxicillin  (AMOXIL ) 500 MG capsule Take 2 capsules (1,000 mg total) by mouth 3 (three) times daily. Patient not taking: Reported on 11/17/2023 02/09/23   Calone, Gregory D, FNP  aspirin-acetaminophen -caffeine (EXCEDRIN MIGRAINE) 250-250-65 MG tablet Take 2 tablets by mouth every 6 (six) hours as needed for headache or migraine.    [provider]  Calcium Carbonate-Vit D-Min (CALCIUM 1200 PO) Take 1,200 mg by mouth 2 (two) times daily.     [provider]  cholecalciferol (VITAMIN D3) 25 MCG (1000 UNIT) tablet Take 1,000 Units by mouth daily.    [provider]  clonazePAM  (KLONOPIN ) 1 MG tablet TAKE 1 TABLET BY MOUTH 2 TIMES A DAY MAY TAKE ADDITIONAL TABLET IF NEEDED 11/17/23   Marvia Slocumb T, PA-C  diphenoxylate -atropine  (LOMOTIL ) 2.5-0.025 MG tablet Take 1 tablet by mouth 2 (two) times daily. Patient not taking: Reported on 11/17/2023 10/09/22   Gherghe, Costin M, MD  eletriptan  (RELPAX ) 40 MG tablet TAKE 1 TABLET BY MOUTH AT ONSET OF HEADACHE; MAY REPEAT 1 TABLET IN 2 HOURS IF NEEDED. 08/02/23   Marvia Slocumb T, PA-C  esomeprazole (NEXIUM) 20 MG capsule Take 20 mg by mouth daily at 12 noon.    [provider]  gabapentin  (NEURONTIN ) 300 MG capsule TAKE THREE CAPSULES BY MOUTH EVERY NIGHT AT BEDTIME 11/17/23   Hurst, Rogene Claude, PA-C  LINZESS  290 MCG CAPS capsule Take 1 capsule (290 mcg total) by mouth daily. Patient not taking: Reported on 11/17/2023 01/19/23   Calone, Gregory D, FNP  Magnesium  400 MG CAPS Take 400 mg by mouth 2 (two) times daily.     [provider]  ondansetron  (ZOFRAN ) 8 MG tablet TAKE 1 TABLET BY MOUTH  EVERY 8 HOURS AS NEEDED FOR NAUSEA AND/OR VOMITING 10/26/23   Calone, Gregory D, FNP  oxyCODONE  10 MG TABS Take 1 tablet (10 mg total) by mouth every 4 (four) hours as needed for severe pain ((score 7 to 10)). Patient not taking: Reported on 05/09/2023 10/09/22   Osborn Blaze, MD  potassium chloride  SA (KLOR-CON  M) 20 MEQ tablet Take 1 tablet (20 mEq total) by mouth daily as needed (if more than 3 loose stools). Patient not taking: Reported on 11/17/2023 10/09/22   Gherghe, Costin M, MD  promethazine  (PHENERGAN ) 25 MG tablet Take 0.5-1 tablets (12.5-25 mg total) by mouth every 6 (six) hours as needed for nausea or vomiting. Patient not taking: Reported on 11/17/2023 02/09/23   Calone, Gregory D, FNP  saccharomyces boulardii (FLORASTOR) 250 MG capsule Take 1 capsule (250 mg total) by mouth 2 (two) times daily. Patient not taking: Reported on 11/17/2023 10/09/22   Osborn Blaze, MD  venlafaxine   XR (EFFEXOR -XR) 150 MG 24 hr capsule Take 2 capsules (300 mg total) by mouth daily. 11/17/23   Verneda Golder, PA-C    Physical Exam: Vitals:   12/15/23 1830 12/15/23 1900 12/15/23 2039 12/15/23 2315  BP: 135/82 126/66 (!) 146/80 (!) 143/80  Pulse: 90 91 98 94  Resp: (!) 21 12 12 20   Temp:   98.6 F (37 C) 98.9 F (37.2 C)  TempSrc:   Oral Oral  SpO2: 100% 100% 100% 100%  Weight:      Height:       Constitutional: Resting in bed, NAD, calm, comfortable Eyes: EOMI, lids and conjunctivae normal ENMT: Mucous membranes are moist. Posterior pharynx clear of any exudate or lesions.Normal dentition.  Neck: normal, supple, no masses. Respiratory: clear to auscultation bilaterally, no wheezing, no crackles. Normal respiratory effort. No accessory muscle use.  Cardiovascular: Regular rate and rhythm, systolic murmur. No extremity edema. 2+ pedal pulses. Abdomen: no tenderness, no masses palpated. Musculoskeletal: no clubbing / cyanosis. No joint deformity upper and lower extremities. Good ROM, no  contractures. Normal muscle tone.  Skin: no rashes, lesions, ulcers. No induration Neurologic: Sensation intact. Strength 5/5 in all 4.  Psychiatric: Normal judgment and insight. Alert and oriented x 3. Normal mood.   EKG: Personally reviewed. Sinus rhythm, rate 90, no acute ischemic changes.  Rate is slower when compared to previous.  Assessment/Plan Principal Problem:   Symptomatic anemia Active Problems:   Hypokalemia   Depression with anxiety   Ketzia E Larke is a 63 y.o. female with medical history significant for chronic normocytic anemia, chronic migraines, restless leg syndrome, depression/anxiety, s/p L4-5 microdiscectomy complicated by postsurgical infection with MSSA bacteremia/meningitis (09/2022) who is admitted with symptomatic anemia.  Assessment and Plan: Symptomatic anemia with iron deficiency: Hemoglobin 6.0 on admission compared to previous baseline ~10.  Anemia panel shows ferritin <3, iron 9, TIBC 42, iron sats 2, vitamin B12 376, and folate 16.8.  She has not had any obvious bleeding.  FOBT was negative. - Transfused 2 unit PRBCs - Repeat CBC in a.m. - Consider IV iron transfusion prior to discharge  Hypokalemia: Supplementing.  Depression/anxiety: Continue Effexor  XR and home Klonopin  1 mg twice daily as needed.  Chronic migraines: Continue home Relpax .  Restless leg syndrome: Continue gabapentin .   DVT prophylaxis: SCDs Start: 12/15/23 2324 Code Status:   Code Status: Limited: Do not attempt resuscitation (DNR) -DNR-LIMITED -Do Not Intubate/DNI   confirmed with patient on admission. Family Communication: Discussed with patient, she has discussed with family Disposition Plan: From home, dispo pending clinical progress Consults called: None Severity of Illness: The appropriate patient status for this patient is INPATIENT. Inpatient status is judged to be reasonable and necessary in order to provide the required intensity of service to ensure the  patient's safety. The patient's presenting symptoms, physical exam findings, and initial radiographic and laboratory data in the context of their chronic comorbidities is felt to place them at high risk for further clinical deterioration. Furthermore, it is not anticipated that the patient will be medically stable for discharge from the hospital within 2 midnights of admission.   * I certify that at the point of admission it is my clinical judgment that the patient will require inpatient hospital care spanning beyond 2 midnights from the point of admission due to high intensity of service, high risk for further deterioration and high frequency of surveillance required.Edith Gores MD [facility]  If 7PM-7AM, please contact night-coverage [email or web address]  12/15/2023, 11:47  PM

## 2023-12-16 ENCOUNTER — Other Ambulatory Visit (HOSPITAL_COMMUNITY): Payer: Self-pay | Admitting: Internal Medicine

## 2023-12-16 ENCOUNTER — Telehealth (HOSPITAL_COMMUNITY): Payer: Self-pay | Admitting: Internal Medicine

## 2023-12-16 DIAGNOSIS — D509 Iron deficiency anemia, unspecified: Secondary | ICD-10-CM

## 2023-12-16 DIAGNOSIS — D649 Anemia, unspecified: Secondary | ICD-10-CM | POA: Diagnosis not present

## 2023-12-16 LAB — HEMOGLOBIN AND HEMATOCRIT, BLOOD
HCT: 28.7 % — ABNORMAL LOW (ref 36.0–46.0)
Hemoglobin: 8 g/dL — ABNORMAL LOW (ref 12.0–15.0)

## 2023-12-16 LAB — BASIC METABOLIC PANEL WITH GFR
Anion gap: 10 (ref 5–15)
BUN: 16 mg/dL (ref 8–23)
CO2: 20 mmol/L — ABNORMAL LOW (ref 22–32)
Calcium: 8.4 mg/dL — ABNORMAL LOW (ref 8.9–10.3)
Chloride: 111 mmol/L (ref 98–111)
Creatinine, Ser: 0.95 mg/dL (ref 0.44–1.00)
GFR, Estimated: >60 mL/min (ref >60)
Glucose, Bld: 131 mg/dL — ABNORMAL HIGH (ref 70–99)
Potassium: 4.3 mmol/L (ref 3.5–5.1)
Sodium: 141 mmol/L (ref 135–145)

## 2023-12-16 LAB — PREPARE RBC (CROSSMATCH)

## 2023-12-16 LAB — CBC
HCT: 21.2 % — ABNORMAL LOW (ref 36.0–46.0)
Hemoglobin: 5.7 g/dL — CL (ref 12.0–15.0)
MCH: 19.1 pg — ABNORMAL LOW (ref 26.0–34.0)
MCHC: 26.9 g/dL — ABNORMAL LOW (ref 30.0–36.0)
MCV: 70.9 fL — ABNORMAL LOW (ref 80.0–100.0)
Platelets: 443 10*3/uL — ABNORMAL HIGH (ref 150–400)
RBC: 2.99 MIL/uL — ABNORMAL LOW (ref 3.87–5.11)
RDW: 17.6 % — ABNORMAL HIGH (ref 11.5–15.5)
WBC: 7 10*3/uL (ref 4.0–10.5)
nRBC: 0.3 % — ABNORMAL HIGH (ref 0.0–0.2)

## 2023-12-16 LAB — HIV ANTIBODY (ROUTINE TESTING W REFLEX): HIV Screen 4th Generation wRfx: NONREACTIVE

## 2023-12-16 LAB — ABO/RH: ABO/RH(D): O POS

## 2023-12-16 LAB — TSH: TSH: 0.76 u[IU]/mL (ref 0.350–4.500)

## 2023-12-16 MED ORDER — FERROUS SULFATE 325 (65 FE) MG PO TABS: 325.0000 mg | ORAL_TABLET | Freq: Every day | ORAL | Status: DC

## 2023-12-16 MED ORDER — HYDRALAZINE HCL 20 MG/ML IJ SOLN: 10.0000 mg | INTRAMUSCULAR | Status: DC | PRN

## 2023-12-16 MED ORDER — FAMOTIDINE 20 MG PO TABS
20.0000 mg | ORAL_TABLET | Freq: Two times a day (BID) | ORAL | Status: DC
  Administered 2023-12-16: 20 mg via ORAL
  Filled 2023-12-16: qty 1

## 2023-12-16 MED ORDER — SENNOSIDES-DOCUSATE SODIUM 8.6-50 MG PO TABS
2.0000 | ORAL_TABLET | Freq: Every day | ORAL | Status: DC
Start: 2023-12-16 — End: 2023-12-17

## 2023-12-16 MED ORDER — GLUCAGON HCL RDNA (DIAGNOSTIC) 1 MG IJ SOLR: 1.0000 mg | INTRAMUSCULAR | Status: DC | PRN

## 2023-12-16 MED ORDER — METOPROLOL TARTRATE 5 MG/5ML IV SOLN: 5.0000 mg | INTRAVENOUS | Status: DC | PRN

## 2023-12-16 MED ORDER — IRON SUCROSE 400 MG IVPB - SIMPLE MED
400.0000 mg | Freq: Once | Status: AC
  Administered 2023-12-16: 400 mg via INTRAVENOUS
  Filled 2023-12-16: qty 400

## 2023-12-16 MED ORDER — GUAIFENESIN 100 MG/5ML PO LIQD: 5.0000 mL | ORAL | Status: DC | PRN

## 2023-12-16 MED ORDER — TIZANIDINE HCL 4 MG PO TABS
4.0000 mg | ORAL_TABLET | Freq: Three times a day (TID) | ORAL | Status: DC | PRN
  Administered 2023-12-16: 4 mg via ORAL
  Filled 2023-12-16: qty 1

## 2023-12-16 MED ORDER — TIZANIDINE HCL 4 MG PO TABS
4.0000 mg | ORAL_TABLET | Freq: Once | ORAL | Status: AC
  Administered 2023-12-16: 4 mg via ORAL
  Filled 2023-12-16: qty 1

## 2023-12-16 MED ORDER — IPRATROPIUM-ALBUTEROL 0.5-2.5 (3) MG/3ML IN SOLN: 3.0000 mL | RESPIRATORY_TRACT | Status: DC | PRN

## 2023-12-16 NOTE — Progress Notes (Signed)
 Date: 12/16/2023 Patient: Amanda [name] Admitted: 12/15/2023  5:20 PM Attending Provider: Maggie Schooner, MD  Andromeda E Cacioppo has made the decision for the patient to leave [facility] 4[facility] Progressive Care Unit against the advice of Amin, Katharyn Pall, MD.  She or her authorized caregiver has been informed and understands the inherent risks, including death.  She has decided to accept the responsibility for this decision. Lianni E Luckadoo and all necessary parties have been advised that she may return for further evaluation or treatment. Her condition at time of discharge was stable. Patient ambulated independently off unit. Shaton E Sheaffer had current vital signs as follows:  Blood pressure 134/74, pulse 86, temperature 97.9 F (36.6 C), temperature source Oral, resp. rate 16, height 5\' 7"  (1.702 m), weight 77.8 kg, SpO2 100%.   Bronda E Diers has signed the Leaving Against Medical Advice form prior to leaving the unit.  Frankie Israel 12/16/2023

## 2023-12-16 NOTE — Progress Notes (Signed)
 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  Patient found to have symptomatic anemia with hemoglobin 6.0.  FOBT is negative.  CTA chest was also negative.   Assessment & Plan:  Principal Problem:   Symptomatic anemia Active Problems:   Hypokalemia   Depression with anxiety   Symptomatic anemia with iron deficiency: Hemoglobin 6.0 on admission compared to previous baseline ~10.  Noted to have severe iron deficiency anemia.  B12 and folate are normal.  2 units PRBC ordered.  Will give IV iron today followed by p.o. starting tomorrow   Hypokalemia: As needed repletion   Depression/anxiety: Continue Effexor  XR and home Klonopin  1 mg twice daily as needed.   Chronic migraines: Continue home Relpax .   Restless leg syndrome: Continue gabapentin .   DVT prophylaxis: SCDs Start: 12/15/23 2324    Code Status: Limited: Do not attempt resuscitation (DNR) -DNR-LIMITED -Do Not Intubate/DNI  Family Communication:   Ongoing management for symptomatic anemia    Subjective: Seen at bedside.  Patient states she has had anemia in the past.  She has also had colonoscopy with [facility] back in 2023 and it was essentially negative. She denies any obvious source of bleeding.  She also had hysterectomy.  At this time she would like to just proceed with occasional IV iron infusion with p.o. supplements.   Examination:  General exam: Appears calm and comfortable  Respiratory system: Clear to auscultation. Respiratory effort normal. Cardiovascular system: S1 & S2 heard, RRR. No JVD, murmurs, rubs, gallops or clicks. No pedal edema. Gastrointestinal system: Abdomen is  nondistended, soft and nontender. No organomegaly or masses felt. Normal bowel sounds heard. Central nervous system: Alert and oriented. No focal neurological deficits. Extremities: Symmetric 5 x 5 power. Skin: No rashes, lesions or ulcers Psychiatry: Judgement and insight appear normal. Mood & affect appropriate.                Diet Orders (From admission, onward)     Start     Ordered   12/15/23 2325  Diet Heart Room service appropriate? Yes; Fluid consistency: Thin  Diet effective now       Question Answer Comment  Room service appropriate? Yes   Fluid consistency: Thin      12/15/23 2325            Objective: Vitals:   12/16/23 0534 12/16/23 0536 12/16/23 0551 12/16/23 0952  BP: 97/62 97/62 (!) 97/57 118/61  Pulse: 75 75 73 100  Resp:  19 18 18   Temp: 98.6 F (37 C) 98.6 F (37 C) 97.9 F (36.6 C) 97.9 F (36.6 C)  TempSrc: Oral Oral Oral Oral  SpO2: 100%  98% 100%  Weight:      Height:        Intake/Output Summary (Last 24 hours) at 12/16/2023 1058 Last data filed at 12/16/2023 1005 Gross per 24 hour  Intake 1408.34 ml  Output --  Net 1408.34 ml   Filed Weights   12/15/23 1658 12/16/23 0421  Weight: 79.4 kg 77.8 kg    Scheduled Meds:  sodium chloride    Intravenous Once   [START ON 12/17/2023] ferrous sulfate  325 mg Oral Q breakfast   gabapentin   900 mg Oral QHS   pantoprazole   40  mg Oral Daily   senna-docusate  2 tablet Oral QHS   sodium chloride  flush  3 mL Intravenous Q12H   venlafaxine  XR  300 mg Oral Daily   Continuous Infusions:  iron sucrose      Nutritional status     Body mass index is 26.86 kg/m.  Data Reviewed:   CBC: Recent Labs  Lab 12/15/23 1734 12/16/23 0039  WBC 8.4 7.0  HGB 6.0* 5.7*  HCT 21.8* 21.2*  MCV 69.0* 70.9*  PLT 512* 443*   Basic Metabolic Panel: Recent Labs  Lab 12/15/23 1734 12/16/23 0039  NA 139 141  K 3.3* 4.3  CL 103 111  CO2 22 20*  GLUCOSE 91 131*  BUN 18 16  CREATININE 1.17*  0.95  CALCIUM 9.4 8.4*   GFR: Estimated Creatinine Clearance: 64.3 mL/min (by C-G formula based on SCr of 0.95 mg/dL). Liver Function Tests: No results for input(s): "AST", "ALT", "ALKPHOS", "BILITOT", "PROT", "ALBUMIN" in the last 168 hours. No results for input(s): "LIPASE", "AMYLASE" in the last 168 hours. No results for input(s): "AMMONIA" in the last 168 hours. Coagulation Profile: Recent Labs  Lab 12/15/23 1734  INR 1.0   Cardiac Enzymes: No results for input(s): "CKTOTAL", "CKMB", "CKMBINDEX", "TROPONINI" in the last 168 hours. BNP (last 3 results) Recent Labs    12/15/23 1734  PROBNP 87.0   HbA1C: No results for input(s): "HGBA1C" in the last 72 hours. CBG: No results for input(s): "GLUCAP" in the last 168 hours. Lipid Profile: No results for input(s): "CHOL", "HDL", "LDLCALC", "TRIG", "CHOLHDL", "LDLDIRECT" in the last 72 hours. Thyroid  Function Tests: No results for input(s): "TSH", "T4TOTAL", "FREET4", "T3FREE", "THYROIDAB" in the last 72 hours. Anemia Panel: Recent Labs    12/15/23 2138  VITAMINB12 376  FOLATE 16.8  FERRITIN <3*  TIBC 482*  IRON 9*   Sepsis Labs: No results for input(s): "PROCALCITON", "LATICACIDVEN" in the last 168 hours.  No results found for this or any previous visit (from the past 240 hours).       Radiology Studies: CT Angio Chest PE W/Cm &/Or Wo Cm Result Date: 12/15/2023 CLINICAL DATA:  Positive D-dimer.  Shortness of breath. EXAM: CT ANGIOGRAPHY CHEST WITH CONTRAST TECHNIQUE: Multidetector CT imaging of the chest was performed using the standard protocol during bolus administration of intravenous contrast. Multiplanar CT image reconstructions and MIPs were obtained to evaluate the vascular anatomy. RADIATION DOSE REDUCTION: This exam was performed according to the departmental dose-optimization program which includes automated exposure control, adjustment of the mA and/or kV according to patient size and/or use of iterative  reconstruction technique. CONTRAST:  100mL OMNIPAQUE IOHEXOL 350 MG/ML SOLN COMPARISON:  None Available. FINDINGS: Cardiovascular: Satisfactory opacification of the pulmonary arteries to the segmental level. No evidence of pulmonary embolism. Normal heart size. No pericardial effusion. Mediastinum/Nodes: No enlarged mediastinal, hilar, or axillary lymph nodes. Thyroid  gland, trachea, and esophagus demonstrate no significant findings. Lungs/Pleura: Lungs are clear. No pleural effusion or pneumothorax. Upper Abdomen: No acute abnormality. Musculoskeletal: No chest wall abnormality. No acute or significant osseous findings. Review of the MIP images confirms the above findings. IMPRESSION: No evidence for pulmonary embolism or other acute intrathoracic process. Electronically Signed   By: Tyron Gallon M.D.   On: 12/15/2023 20:13   DG Chest 2 View Result Date: 12/15/2023 CLINICAL DATA:  Shortness of breath. EXAM: CHEST - 2 VIEW COMPARISON:  09/26/2022 FINDINGS: The cardiomediastinal contours are normal. The lungs are clear. Pulmonary vasculature is normal. No consolidation, pleural effusion, or pneumothorax.  No acute osseous abnormalities are seen. IMPRESSION: No active cardiopulmonary disease. Electronically Signed   By: Chadwick Colonel M.D.   On: 12/15/2023 18:26           LOS: 1 day   Time spent= 35 mins    Maggie Schooner, MD [facility]  If 7PM-7AM, please contact night-coverage  12/16/2023, 10:58 AM

## 2023-12-16 NOTE — Plan of Care (Signed)

## 2023-12-16 NOTE — Care Management CC44 (Signed)
 Condition Code 44 Documentation Completed  Patient Details  Name: Amanda [name] MRN: [id number] Date of Birth: March 09, 1961   Condition Code 44 given:  Yes Patient signature on Condition Code 44 notice:  Yes Documentation of 2 MD's agreement:  Yes Code 44 added to claim:  Yes    Jannice Mends, LCSW 12/16/2023, 6:06 PM

## 2023-12-16 NOTE — Progress Notes (Signed)
   12/16/23 0940  TOC Brief Assessment  Insurance and Status Reviewed  Patient has primary care physician Yes  Home environment has been reviewed Resides in single family home with spouse  Prior level of function: Independent with ADLs at baseline  Prior/Current Home Services No current home services  Social Drivers of Health Review SDOH reviewed no interventions necessary  Readmission risk has been reviewed Yes  Transition of care needs no transition of care needs at this time

## 2023-12-16 NOTE — Telephone Encounter (Signed)
 Patient referred to infusion pharmacy team for ambulatory infusion of IV iron.  Insurance - BCBS Medicare  Dx code - D64.9/D50.9  IV Iron Therapy - Feraheme 510 mg IV x 2  Infusion appointments - [facility] Scheduling team will schedule patient as soon as possible.   Leanora Murin D. Kyrese Gartman, PharmD

## 2023-12-16 NOTE — TOC Transition Note (Signed)
 Transition of Care [facility]) - Discharge Note   Patient Details  Name: Amanda [name] MRN: [id number] Date of Birth: Nov 12, 1960  [facility] CM/SW Contact:  Valley Gavia, LCSWA Phone Number: 12/16/2023, 6:25 PM   Clinical Narrative:     CSW spoke with pt via phone to advise of Code 44, pt states if she is not changed to inpatient status she will leave AMA as she cannot afford to pay out of pocket. CSW spoke with MD and UMR, pt does not meet criteria for inpatient. Pt states she will leave AMA, MD and RN made aware.     Barriers to Discharge: Continued Medical Work up   Patient Goals and CMS Choice            Discharge Placement                       Discharge Plan and Services Additional resources added to the After Visit Summary for                                       Social Drivers of Health (SDOH) Interventions SDOH Screenings   Food Insecurity: No Food Insecurity (12/15/2023)  Housing: Low Risk  (12/15/2023)  Transportation Needs: No Transportation Needs (12/15/2023)  Utilities: Not At Risk (12/15/2023)  Depression (PHQ2-9): Low Risk  (03/31/2023)  Financial Resource Strain: Low Risk  (10/20/2022)   Received from [facility]  Social Connections: Moderately Integrated (12/15/2023)  Tobacco Use: Low Risk  (12/15/2023)     Readmission Risk Interventions    10/06/2022   12:38 PM  Readmission Risk Prevention Plan  Post Dischage Appt Complete  Medication Screening Complete  Transportation Screening Complete

## 2023-12-16 NOTE — Care Management Obs Status (Signed)
 MEDICARE OBSERVATION STATUS NOTIFICATION   Patient Details  Name: Amanda [name] MRN: [id number] Date of Birth: 05-29-1961   Medicare Observation Status Notification Given:  Yes    Jannice Mends, LCSW 12/16/2023, 6:05 PM

## 2023-12-17 NOTE — Discharge Summary (Signed)
 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  Patient found to have symptomatic anemia with hemoglobin 6.0.  FOBT is negative.  CTA chest was also negative.   Patient received IV iron  and PRBC transfusion in the hospital. Eventually the evening she decided to leave AGAINST MEDICAL ADVICE  Assessment & Plan:  Principal Problem:   Symptomatic anemia Active Problems:   Hypokalemia   Depression with anxiety   Symptomatic anemia with iron  deficiency: Hemoglobin 6.0 on admission compared to previous baseline ~10.  Noted to have severe iron  deficiency anemia.  B12 and folate are normal.  2 units PRBC ordered.  Will give IV iron  today followed by p.o. starting tomorrow   Hypokalemia: As needed repletion   Depression/anxiety: Continue Effexor  XR and home Klonopin  1 mg twice daily as needed.   Chronic migraines: Continue home Relpax .   Restless leg syndrome: Continue gabapentin .   DVT prophylaxis: SCDs Start: 12/15/23 2324    Code Status: Limited: Do not attempt resuscitation (DNR) -DNR-LIMITED -Do Not Intubate/DNI  Family Communication:   Left AMA   Discharge Diagnoses:  Principal Problem:   Symptomatic anemia Active Problems:   Hypokalemia   Depression with anxiety      Discharge Exam: Vitals:   12/16/23 0952 12/16/23 1454  BP: 118/61 134/74  Pulse: 100 86  Resp: 18 16   Temp: 97.9 F (36.6 C) 97.9 F (36.6 C)  SpO2: 100% 100%   Vitals:   12/16/23 0536 12/16/23 0551 12/16/23 0952 12/16/23 1454  BP: 97/62 (!) 97/57 118/61 134/74  Pulse: 75 73 100 86  Resp: 19 18 18 16   Temp: 98.6 F (37 C) 97.9 F (36.6 C) 97.9 F (36.6 C) 97.9 F (36.6 C)  TempSrc: Oral Oral Oral Oral  SpO2:  98% 100% 100%  Weight:      Height:          Discharge Instructions  Discharge Instructions     Amb Referral to Intravenous Iron  Therapy   Complete by: As directed    You have been referred to [facility] team for IV Iron  Infusions. The infusion pharmacy team will reach out to you with appointment information.    Primary Diagnosis Code for IV Iron : D50.9 - Iron  deficiency Anemia   Secondary diagnosis code for IV iron : Other   Comment: chronic normocytic anemia, low iron  stores      Allergies as of 12/16/2023       Reactions   Cephalexin Diarrhea, Itching, Swelling   Metoclopramide  Other (See Comments)   Irritates the restless legs   Vancomycin  Other (See Comments)   "Muscle pain and hip pain"    Wellbutrin [bupropion] Other (See Comments)   GERD   Aspirin Other (See Comments)   Makes her ears ring        Medication List     ASK your doctor about these medications    acetaminophen -codeine 300-30 MG tablet Commonly known as: TYLENOL  #3 Take 1 tablet by mouth every 6 (six) hours as needed.   aspirin-acetaminophen -caffeine 250-250-65 MG tablet Commonly  known as: EXCEDRIN MIGRAINE Take 2 tablets by mouth daily as needed for headache or migraine.   CALCIUM 1200 PO Take 1 tablet by mouth daily.   clonazePAM  1 MG tablet Commonly known as: KLONOPIN  TAKE 1 TABLET BY MOUTH 2 TIMES A DAY MAY TAKE ADDITIONAL TABLET IF NEEDED   clotrimazole-betamethasone cream Commonly known as: LOTRISONE Apply 1 Application topically as needed (rash).   eletriptan  40 MG tablet Commonly known as: RELPAX  TAKE 1 TABLET BY MOUTH AT ONSET OF HEADACHE; MAY  REPEAT 1 TABLET IN 2 HOURS IF NEEDED.   esomeprazole 20 MG capsule Commonly known as: NEXIUM Take 20 mg by mouth daily.   gabapentin  300 MG capsule Commonly known as: NEURONTIN  TAKE THREE CAPSULES BY MOUTH EVERY NIGHT AT BEDTIME   ibuprofen  800 MG tablet Commonly known as: ADVIL  Take 800 mg by mouth 3 (three) times daily.   ketorolac  0.5 % ophthalmic solution Commonly known as: ACULAR  SMARTSIG:In Eye(s)   Magnesium  400 MG Caps Take 400 mg by mouth 2 (two) times daily.   methylPREDNISolone  4 MG Tbpk tablet Commonly known as: MEDROL  DOSEPAK See admin instructions.   moxifloxacin 0.5 % ophthalmic solution Commonly known as: VIGAMOX Place 1 drop into the left eye 4 (four) times daily.   ondansetron  8 MG tablet Commonly known as: ZOFRAN  TAKE 1 TABLET BY MOUTH EVERY 8 HOURS AS NEEDED FOR NAUSEA AND/OR VOMITING   prednisoLONE acetate 1 % ophthalmic suspension Commonly known as: PRED FORTE Place 1 drop into the left eye 4 (four) times daily.   promethazine  25 MG tablet Commonly known as: PHENERGAN  Take 25 mg by mouth as needed for nausea or vomiting. Ask about: Which instructions should I use?   tiZANidine 4 MG tablet Commonly known as: ZANAFLEX Take 4 mg by mouth at bedtime.   triamcinolone cream 0.1 % Commonly known as: KENALOG 1 Application.   venlafaxine  XR 150 MG 24 hr capsule Commonly known as: EFFEXOR -XR Take 2 capsules (300 mg total) by mouth daily.        Allergies  Allergen Reactions   Cephalexin Diarrhea, Itching and Swelling   Metoclopramide  Other (See Comments)    Irritates the restless legs   Vancomycin  Other (See Comments)    "Muscle pain and hip pain"    Wellbutrin [Bupropion] Other (See Comments)    GERD   Aspirin Other (See Comments)    Makes her ears ring    You were cared for by a hospitalist during your hospital stay. If you have any questions about your discharge medications or the care you received while you were in the hospital  after you are discharged, you can call the unit and asked to speak with the hospitalist on call if the hospitalist that took care of you is not available. Once you are discharged, your primary care physician will handle any further medical issues. Please note that no refills for any discharge medications will be authorized once you are discharged, as it is imperative that you return to your primary care physician (or establish a relationship with a primary care physician if you do not have one) for your aftercare needs so that they can reassess your need for medications and monitor your lab values.  You were cared for by a hospitalist during your hospital stay. If you have any questions about your discharge medications or the care you received while you were in the hospital after you are discharged, you can call the unit and asked to speak with the hospitalist on  call if the hospitalist that took care of you is not available. Once you are discharged, your primary care physician will handle any further medical issues. Please note that NO REFILLS for any discharge medications will be authorized once you are discharged, as it is imperative that you return to your primary care physician (or establish a relationship with a primary care physician if you do not have one) for your aftercare needs so that they can reassess your need for medications and monitor your lab values.  Please request your Prim.MD to go over all Hospital Tests and Procedure/Radiological results at the follow up, please get all Hospital records sent to your Prim MD by signing hospital release before you go home.  Get CBC, CMP, 2 view Chest X ray checked  by Primary MD during your next visit or SNF MD in 5-7 days ( we routinely change or add medications that can affect your baseline labs and fluid status, therefore we recommend that you get the mentioned basic workup next visit with your PCP, your PCP may decide not to get them or add new tests  based on their clinical decision)  On your next visit with your primary care physician please Get Medicines reviewed and adjusted.  If you experience worsening of your admission symptoms, develop shortness of breath, life threatening emergency, suicidal or homicidal thoughts you must seek medical attention immediately by calling 911 or calling your MD immediately  if symptoms less severe.  You Must read complete instructions/literature along with all the possible adverse reactions/side effects for all the Medicines you take and that have been prescribed to you. Take any new Medicines after you have completely understood and accpet all the possible adverse reactions/side effects.   Do not drive, operate heavy machinery, perform activities at heights, swimming or participation in water activities or provide baby sitting services if your were admitted for syncope or siezures until you have seen by Primary MD or a Neurologist and advised to do so again.  Do not drive when taking Pain medications.   Procedures/Studies: CT Angio Chest PE W/Cm &/Or Wo Cm Result Date: 12/15/2023 CLINICAL DATA:  Positive D-dimer.  Shortness of breath. EXAM: CT ANGIOGRAPHY CHEST WITH CONTRAST TECHNIQUE: Multidetector CT imaging of the chest was performed using the standard protocol during bolus administration of intravenous contrast. Multiplanar CT image reconstructions and MIPs were obtained to evaluate the vascular anatomy. RADIATION DOSE REDUCTION: This exam was performed according to the departmental dose-optimization program which includes automated exposure control, adjustment of the mA and/or kV according to patient size and/or use of iterative reconstruction technique. CONTRAST:  100mL OMNIPAQUE IOHEXOL 350 MG/ML SOLN COMPARISON:  None Available. FINDINGS: Cardiovascular: Satisfactory opacification of the pulmonary arteries to the segmental level. No evidence of pulmonary embolism. Normal heart size. No pericardial  effusion. Mediastinum/Nodes: No enlarged mediastinal, hilar, or axillary lymph nodes. Thyroid  gland, trachea, and esophagus demonstrate no significant findings. Lungs/Pleura: Lungs are clear. No pleural effusion or pneumothorax. Upper Abdomen: No acute abnormality. Musculoskeletal: No chest wall abnormality. No acute or significant osseous findings. Review of the MIP images confirms the above findings. IMPRESSION: No evidence for pulmonary embolism or other acute intrathoracic process. Electronically Signed   By: Tyron Gallon M.D.   On: 12/15/2023 20:13   DG Chest 2 View Result Date: 12/15/2023 CLINICAL DATA:  Shortness of breath. EXAM: CHEST - 2 VIEW COMPARISON:  09/26/2022 FINDINGS: The cardiomediastinal contours are normal. The lungs are clear. Pulmonary vasculature is normal. No consolidation, pleural effusion, or  pneumothorax. No acute osseous abnormalities are seen. IMPRESSION: No active cardiopulmonary disease. Electronically Signed   By: Chadwick Colonel M.D.   On: 12/15/2023 18:26     The results of significant diagnostics from this hospitalization (including imaging, microbiology, ancillary and laboratory) are listed below for reference.     Microbiology: No results found for this or any previous visit (from the past 240 hours).   Labs: BNP (last 3 results) No results for input(s): "BNP" in the last 8760 hours. Basic Metabolic Panel: Recent Labs  Lab 12/15/23 1734 12/16/23 0039  NA 139 141  K 3.3* 4.3  CL 103 111  CO2 22 20*  GLUCOSE 91 131*  BUN 18 16  CREATININE 1.17* 0.95  CALCIUM 9.4 8.4*   Liver Function Tests: No results for input(s): "AST", "ALT", "ALKPHOS", "BILITOT", "PROT", "ALBUMIN" in the last 168 hours. No results for input(s): "LIPASE", "AMYLASE" in the last 168 hours. No results for input(s): "AMMONIA" in the last 168 hours. CBC: Recent Labs  Lab 12/15/23 1734 12/16/23 0039 12/16/23 1129  WBC 8.4 7.0  --   HGB 6.0* 5.7* 8.0*  HCT 21.8* 21.2* 28.7*   MCV 69.0* 70.9*  --   PLT 512* 443*  --    Cardiac Enzymes: No results for input(s): "CKTOTAL", "CKMB", "CKMBINDEX", "TROPONINI" in the last 168 hours. BNP: Invalid input(s): "POCBNP" CBG: No results for input(s): "GLUCAP" in the last 168 hours. D-Dimer No results for input(s): "DDIMER" in the last 72 hours. Hgb A1c No results for input(s): "HGBA1C" in the last 72 hours. Lipid Profile No results for input(s): "CHOL", "HDL", "LDLCALC", "TRIG", "CHOLHDL", "LDLDIRECT" in the last 72 hours. Thyroid  function studies Recent Labs    12/16/23 1129  TSH 0.760   Anemia work up Recent Labs    12/15/23 2138  VITAMINB12 376  FOLATE 16.8  FERRITIN <3*  TIBC 482*  IRON 9*   Urinalysis    Component Value Date/Time   COLORURINE YELLOW 09/26/2022 1438   APPEARANCEUR HAZY (A) 09/26/2022 1438   LABSPEC 1.012 09/26/2022 1438   PHURINE 5.0 09/26/2022 1438   GLUCOSEU NEGATIVE 09/26/2022 1438   HGBUR MODERATE (A) 09/26/2022 1438   BILIRUBINUR NEGATIVE 09/26/2022 1438   KETONESUR NEGATIVE 09/26/2022 1438   PROTEINUR NEGATIVE 09/26/2022 1438   NITRITE POSITIVE (A) 09/26/2022 1438   LEUKOCYTESUR TRACE (A) 09/26/2022 1438   Sepsis Labs Recent Labs  Lab 12/15/23 1734 12/16/23 0039  WBC 8.4 7.0   Microbiology No results found for this or any previous visit (from the past 240 hours).   Time coordinating discharge:  I have spent 35 minutes face to face with the patient and on the ward discussing the patients care, assessment, plan and disposition with other care givers. >50% of the time was devoted counseling the patient about the risks and benefits of treatment/Discharge disposition and coordinating care.   SIGNED:   Maggie Schooner, MD  [facility] 12/17/2023, 12:23 PM   If 7PM-7AM, please contact night-coverage

## 2023-12-19 LAB — TYPE AND SCREEN
ABO/RH(D): O POS
Antibody Screen: NEGATIVE
Unit division: 0
Unit division: 0

## 2023-12-19 LAB — BPAM RBC
Blood Product Expiration Date: 202506252359
Blood Product Expiration Date: 202506252359
ISSUE DATE / TIME: 202505230218
ISSUE DATE / TIME: 202505230529
Unit Type and Rh: 5100
Unit Type and Rh: 5100

## 2023-12-20 ENCOUNTER — Encounter (INDEPENDENT_AMBULATORY_CARE_PROVIDER_SITE_OTHER): Payer: Self-pay

## 2023-12-21 ENCOUNTER — Telehealth: Payer: Self-pay

## 2023-12-21 ENCOUNTER — Other Ambulatory Visit: Payer: Self-pay | Admitting: Internal Medicine

## 2023-12-21 DIAGNOSIS — D509 Iron deficiency anemia, unspecified: Secondary | ICD-10-CM | POA: Insufficient documentation

## 2023-12-21 NOTE — Telephone Encounter (Signed)
 Auth Submission: NO AUTH NEEDED Site of care: Site of care: [facility] Payer: BCBS medicare Medication & CPT/J Code(s) submitted: Feraheme (ferumoxytol) [id number] Route of submission (phone, fax, portal):  Phone # Fax # Auth type: Buy/Bill PB Units/visits requested: 510mg  x 2 doses Reference number:  Approval from: 12/21/23 to 04/22/24

## 2023-12-22 ENCOUNTER — Ambulatory Visit

## 2023-12-29 ENCOUNTER — Ambulatory Visit

## 2023-12-29 VITALS — BP 136/89 | HR 71 | Temp 98.1°F | Resp 18 | Ht 67.0 in | Wt 162.6 lb

## 2023-12-29 DIAGNOSIS — D649 Anemia, unspecified: Secondary | ICD-10-CM

## 2023-12-29 DIAGNOSIS — D509 Iron deficiency anemia, unspecified: Secondary | ICD-10-CM

## 2023-12-29 MED ORDER — SODIUM CHLORIDE 0.9 % IV SOLN
510.0000 mg | Freq: Once | INTRAVENOUS | Status: AC
  Administered 2023-12-29: 510 mg via INTRAVENOUS
  Filled 2023-12-29: qty 17

## 2023-12-29 NOTE — Progress Notes (Signed)
 Diagnosis: Iron  Deficiency Anemia  Provider:  Praveen Mannam MD  Procedure: IV Infusion  IV Type: Peripheral, IV Location: R Forearm  Feraheme (Ferumoxytol), Dose: 510 mg  Infusion Start Time: 1045  Infusion Stop Time: 1104  Post Infusion IV Care: Patient declined observation and Peripheral IV Discontinued  Discharge: Condition: Good, Destination: Home . AVS Declined  Performed by:  Lauran Pollard, LPN

## 2024-01-04 ENCOUNTER — Ambulatory Visit: Admitting: Psychiatry

## 2024-01-05 ENCOUNTER — Ambulatory Visit: Admitting: Physician Assistant

## 2024-01-06 ENCOUNTER — Ambulatory Visit (INDEPENDENT_AMBULATORY_CARE_PROVIDER_SITE_OTHER): Admitting: *Deleted

## 2024-01-06 VITALS — BP 134/83 | HR 66 | Temp 97.9°F | Resp 12 | Ht 67.0 in | Wt 169.4 lb

## 2024-01-06 DIAGNOSIS — D509 Iron deficiency anemia, unspecified: Secondary | ICD-10-CM | POA: Diagnosis not present

## 2024-01-06 DIAGNOSIS — D649 Anemia, unspecified: Secondary | ICD-10-CM

## 2024-01-06 MED ORDER — SODIUM CHLORIDE 0.9 % IV SOLN
510.0000 mg | Freq: Once | INTRAVENOUS | Status: AC
  Filled 2024-01-06: qty 17

## 2024-01-06 NOTE — Addendum Note (Signed)
 Addended by: Star East on: 01/06/2024 12:43 PM   Modules accepted: Orders

## 2024-01-06 NOTE — Progress Notes (Signed)
 Diagnosis: Iron  Deficiency Anemia  Provider:  Mannam, Praveen MD  Procedure: IV Infusion  IV Type: Peripheral, IV Location: L Antecubital  Feraheme (Ferumoxytol ), Dose: 510 mg  Infusion Start Time: 0927 am  Infusion Stop Time: 0950 am  Post Infusion IV Care: Observation period completed and Peripheral IV Discontinued  Discharge: Condition: Good, Destination: Home . AVS Declined  Performed by:  Mayme Spearman, RN

## 2024-02-14 ENCOUNTER — Ambulatory Visit: Admitting: Physician Assistant

## 2024-02-20 DIAGNOSIS — D508 Other iron deficiency anemias: Secondary | ICD-10-CM | POA: Diagnosis not present

## 2024-02-20 DIAGNOSIS — Z1330 Encounter for screening examination for mental health and behavioral disorders, unspecified: Secondary | ICD-10-CM | POA: Diagnosis not present

## 2024-02-27 DIAGNOSIS — H2512 Age-related nuclear cataract, left eye: Secondary | ICD-10-CM | POA: Diagnosis not present

## 2024-02-28 DIAGNOSIS — H2511 Age-related nuclear cataract, right eye: Secondary | ICD-10-CM | POA: Diagnosis not present

## 2024-02-28 DIAGNOSIS — H25041 Posterior subcapsular polar age-related cataract, right eye: Secondary | ICD-10-CM | POA: Diagnosis not present

## 2024-02-28 DIAGNOSIS — H25011 Cortical age-related cataract, right eye: Secondary | ICD-10-CM | POA: Diagnosis not present

## 2024-03-19 DIAGNOSIS — H25041 Posterior subcapsular polar age-related cataract, right eye: Secondary | ICD-10-CM | POA: Diagnosis not present

## 2024-03-19 DIAGNOSIS — H2511 Age-related nuclear cataract, right eye: Secondary | ICD-10-CM | POA: Diagnosis not present

## 2024-03-19 DIAGNOSIS — H25011 Cortical age-related cataract, right eye: Secondary | ICD-10-CM | POA: Diagnosis not present

## 2024-05-23 ENCOUNTER — Ambulatory Visit: Admitting: Physician Assistant

## 2024-05-29 ENCOUNTER — Other Ambulatory Visit: Payer: Self-pay | Admitting: Physician Assistant

## 2024-06-01 ENCOUNTER — Other Ambulatory Visit: Payer: Self-pay | Admitting: Physician Assistant

## 2024-06-01 DIAGNOSIS — F411 Generalized anxiety disorder: Secondary | ICD-10-CM

## 2024-06-01 MED ORDER — CLONAZEPAM 1 MG PO TABS: ORAL_TABLET | ORAL | 0 refills | Status: DC

## 2024-06-01 NOTE — Telephone Encounter (Signed)
 Pt requests refill for today. She is out.

## 2024-06-11 ENCOUNTER — Emergency Department (HOSPITAL_COMMUNITY)

## 2024-06-11 ENCOUNTER — Emergency Department (HOSPITAL_COMMUNITY)
Admission: EM | Admit: 2024-06-11 | Discharge: 2024-06-11 | Disposition: A | Discharge status: Home / Self Care | Attending: Emergency Medicine | Admitting: Emergency Medicine

## 2024-06-11 DIAGNOSIS — M19012 Primary osteoarthritis, left shoulder: Secondary | ICD-10-CM | POA: Diagnosis not present

## 2024-06-11 DIAGNOSIS — W19XXXA Unspecified fall, initial encounter: Secondary | ICD-10-CM | POA: Insufficient documentation

## 2024-06-11 DIAGNOSIS — M8580 Other specified disorders of bone density and structure, unspecified site: Secondary | ICD-10-CM | POA: Diagnosis not present

## 2024-06-11 DIAGNOSIS — S52615A Nondisplaced fracture of left ulna styloid process, initial encounter for closed fracture: Secondary | ICD-10-CM | POA: Diagnosis not present

## 2024-06-11 DIAGNOSIS — Y92019 Unspecified place in single-family (private) house as the place of occurrence of the external cause: Secondary | ICD-10-CM | POA: Diagnosis not present

## 2024-06-11 DIAGNOSIS — Z7982 Long term (current) use of aspirin: Secondary | ICD-10-CM | POA: Diagnosis not present

## 2024-06-11 DIAGNOSIS — S52612A Displaced fracture of left ulna styloid process, initial encounter for closed fracture: Secondary | ICD-10-CM | POA: Diagnosis not present

## 2024-06-11 DIAGNOSIS — Z043 Encounter for examination and observation following other accident: Secondary | ICD-10-CM | POA: Diagnosis not present

## 2024-06-11 DIAGNOSIS — S6992XA Unspecified injury of left wrist, hand and finger(s), initial encounter: Secondary | ICD-10-CM | POA: Diagnosis not present

## 2024-06-11 DIAGNOSIS — S52502A Unspecified fracture of the lower end of left radius, initial encounter for closed fracture: Secondary | ICD-10-CM | POA: Diagnosis not present

## 2024-06-11 DIAGNOSIS — M85832 Other specified disorders of bone density and structure, left forearm: Secondary | ICD-10-CM | POA: Diagnosis not present

## 2024-06-11 DIAGNOSIS — M25532 Pain in left wrist: Secondary | ICD-10-CM | POA: Diagnosis not present

## 2024-06-11 DIAGNOSIS — S52572A Other intraarticular fracture of lower end of left radius, initial encounter for closed fracture: Secondary | ICD-10-CM | POA: Diagnosis not present

## 2024-06-11 MED ORDER — ONDANSETRON HCL 4 MG/2ML IJ SOLN
4.0000 mg | Freq: Once | INTRAMUSCULAR | Status: AC
  Administered 2024-06-11: 4 mg via INTRAVENOUS
  Filled 2024-06-11: qty 2

## 2024-06-11 MED ORDER — FENTANYL CITRATE (PF) 50 MCG/ML IJ SOSY
50.0000 ug | PREFILLED_SYRINGE | INTRAMUSCULAR | Status: AC
  Administered 2024-06-11: 50 ug via INTRAVENOUS
  Filled 2024-06-11: qty 1

## 2024-06-11 MED ORDER — HYDROMORPHONE HCL 1 MG/ML IJ SOLN
1.0000 mg | Freq: Once | INTRAMUSCULAR | Status: AC
  Administered 2024-06-11: 1 mg via INTRAVENOUS
  Filled 2024-06-11: qty 1

## 2024-06-11 MED ORDER — LIDOCAINE HCL (PF) 1 % IJ SOLN
20.0000 mL | Freq: Once | INTRAMUSCULAR | Status: AC
  Administered 2024-06-11: 20 mL
  Filled 2024-06-11: qty 30

## 2024-06-11 MED ORDER — OXYCODONE-ACETAMINOPHEN 5-325 MG PO TABS: 1.0000 | ORAL_TABLET | Freq: Four times a day (QID) | ORAL | 0 refills | Status: AC | PRN

## 2024-06-11 NOTE — Progress Notes (Signed)
 Orthopedic Tech Progress Note Patient Details:  AMEILIA RATTAN 26-Apr-1961 [id number]  Ortho Devices Type of Ortho Device: Arm sling, Sugartong splint, Finger trap Finger Trap Weight: 10 Ortho Device/Splint Location: lue Ortho Device/Splint Interventions: Ordered, Application, Adjustment  I applied finger traps at drs request. I then applied splint post reduction with drs assist. Post Interventions Patient Tolerated: Well Instructions Provided: Care of device, Adjustment of device  Chandra Dorn PARAS 06/11/2024, 8:19 AM

## 2024-06-11 NOTE — ED Notes (Signed)
 Attempts to remove jewelry previously have failed.  Ring cutter obtained, pressure device placed and extremity elevated.  Pressure device failed and in process of cutting rings (PA performing this task).  Spouse is at bedside.  Ortho standing by.

## 2024-06-11 NOTE — ED Notes (Signed)
 Hard splint and ace wrap were applied by ortho tech.

## 2024-06-11 NOTE — ED Provider Notes (Signed)
 Physical Exam  BP 116/78 (BP Location: Right Arm)   Pulse 62   Temp 97.9 F (36.6 C) (Oral)   Resp 18   Ht 5' 7 (1.702 m)   Wt 81.6 kg   SpO2 96%   BMI 28.19 kg/m   Physical Exam  Procedures  .Foreign Body Removal  Date/Time: 06/11/2024 8:21 AM  Performed by: Neldon Hamp RAMAN, PA Authorized by: Neldon Hamp RAMAN, PA  Consent: Verbal consent obtained Consent given by: patient Patient understanding: patient states understanding of the procedure being performed Patient consent: the patient's understanding of the procedure matches consent given Procedure consent: procedure consent matches procedure scheduled Relevant documents: relevant documents present and verified Test results: test results available and properly labeled Imaging studies: imaging studies available Patient identity confirmed: verbally with patient and arm band Intake: finger (L ring)  Sedation: Patient sedated: no  Patient restrained: no Complexity: simple 1 objects recovered. Objects recovered: 1 ring Post-procedure assessment: foreign body removed  .Reduction of fracture  Date/Time: 06/11/2024 8:21 AM  Performed by: Neldon Hamp RAMAN, PA Authorized by: Neldon Hamp RAMAN, PA  Consent: Verbal consent obtained Risks and benefits: risks, benefits and alternatives were discussed Consent given by: patient Patient understanding: patient states understanding of the procedure being performed Patient consent: the patient's understanding of the procedure matches consent given Relevant documents: relevant documents present and verified Test results: test results available and properly labeled Imaging studies: imaging studies available Patient identity confirmed: verbally with patient and arm band Local anesthesia used: no  Anesthesia: Local anesthesia used: no  Sedation: Patient sedated: no  Patient tolerance: patient tolerated the procedure well with no immediate complications Comments: Distal  radius fracture -- hematoma block and reduction   .Nerve Block  Date/Time: 06/11/2024 8:22 AM  Performed by: Neldon Hamp RAMAN, PA Authorized by: Neldon Hamp RAMAN, PA   Consent:    Consent obtained:  Verbal   Consent given by:  Patient   Risks discussed:  Allergic reaction, infection, nerve damage, unsuccessful block, pain and bleeding   Alternatives discussed:  No treatment and delayed treatment Universal protocol:    Patient identity confirmed:  Verbally with patient and arm band Indications:    Indications:  Pain relief and procedural anesthesia Location:    Body area:  Upper extremity   Laterality:  Left Pre-procedure details:    Skin preparation:  Alcohol Procedure details:    Block needle gauge:  27 G   Anesthetic injected:  Lidocaine  1% w/o epi   Steroid injected:  None   Injection procedure:  Anatomic landmarks identified and incremental injection (Aspirated blood which is indication for hematoma block in correct location) Post-procedure details:    Outcome:  Anesthesia achieved   Procedure completion:  Tolerated .Splint Application  Date/Time: 06/11/2024 8:28 AM  Performed by: Neldon Hamp RAMAN, PA Authorized by: Neldon Hamp RAMAN, PA   Consent:    Consent obtained:  Verbal   Consent given by:  Patient   Risks, benefits, and alternatives were discussed: yes     Risks discussed:  Discoloration, numbness, pain and swelling   Alternatives discussed:  No treatment Universal protocol:    Procedure explained and questions answered to patient or proxy's satisfaction: yes     Relevant documents present and verified: yes     Test results available: yes     Imaging studies available: yes     Required blood products, implants, devices, and special equipment available: yes     Site/side marked: yes  Immediately prior to procedure a time out was called: yes     Patient identity confirmed:  Verbally with patient and arm band Pre-procedure details:    Distal neurologic  exam:  Normal   Distal perfusion: distal pulses strong and brisk capillary refill   Procedure details:    Location:  Wrist   Wrist location:  L wrist   Cast type:  Long arm   Splint type:  Sugar tong   Supplies:  Plaster   Attestation: Splint applied and adjusted personally by me   Post-procedure details:    Distal perfusion: brisk capillary refill     Procedure completion:  Tolerated   Post-procedure imaging: reviewed    SPLINT APPLICATION Date/Time: 8:40 AM Authorized by: Hamp GORMAN Bow Consent: Verbal consent obtained. Risks and benefits: risks, benefits and alternatives were discussed Consent given by: patient Splint applied by: me + orthopedic technician Location details: L arm Splint type: sugar tong Supplies used: Ortho-Glass Post-procedure: The splinted body part was neurovascularly unchanged following the procedure. Patient tolerance: Patient tolerated the procedure well with no immediate complications.    ED Course / MDM   Clinical Course as of 06/11/24 0820  Mon Jun 11, 2024  9448 Patient in Xray [KH]  9389 I have viewed the patient's x-rays of the left shoulder and humerus which did not show evidence of fracture, bony dislocation. [KH]  [phone] Viewed patient's left wrist x-ray which is notable for comminuted distal radius fracture with angulation and displacement.  Do not definitively see fracture of the distal ulna. [KH]    Clinical Course User Index [KH] Keith Sor, PA-C   Medical Decision Making Amount and/or Complexity of Data Reviewed Radiology: ordered.  Risk Prescription drug management.   I personally viewed x-rays of pre and postreduction wrist.  Significant improvement after reduction.  I placed a splint with assistance from Ortho technician.  Post splint assessment with good cap refill and sensation finger movement.       Bow Hamp [place], [place] 06/11/24 9095    Geraldene Hamilton, MD 06/12/24 [phone]

## 2024-06-11 NOTE — Discharge Instructions (Addendum)
 Please call the hand surgeon office today to make an appointment.  Let them know that you had a distal radius fracture that was reduced and splinted in the emergency department and you need to follow-up.  Use Tylenol  and ibuprofen  for pain as discussed below.  For breakthrough pain you can use Percocet (percocet has 325mg  of tylenol  -- do not exceed 4000mg  of tylenol  daily).   Please use Tylenol  or ibuprofen  for pain.  You may use 600 mg ibuprofen  every 6 hours or 1000 mg of Tylenol  every 6 hours.  You may choose to alternate between the 2.  This would be most effective.  Not to exceed 4 g of Tylenol  within 24 hours.  Not to exceed 3200 mg ibuprofen  24 hours.

## 2024-06-11 NOTE — ED Triage Notes (Signed)
 Pt states that this morning she was getting up and tripped and fell d/t the lights being off and unable to see. Pt denies head injury/LOC. Pt with obvious L wrist deformity. PMS intact.

## 2024-06-11 NOTE — ED Provider Notes (Signed)
 Amanda [name]   CSN: [id number] Arrival date & time: 06/11/24  9491     Patient presents with: Amanda [name] is a 63 y.o. female.   63 year old female presents for left wrist pain after a fall on an outstretched hand at home this morning.  Floor is concrete under the rug in patient's bedroom.  She has constant pain to the left wrist which is aggravated by movement and palpation.  No medications taken prior to arrival for pain.  She denies any head trauma, loss of consciousness.  She is right-hand dominant.  The history is provided by the patient. No language interpreter was used.  Fall       Prior to Admission medications   Medication Sig Start Date End Date Taking? Authorizing Provider  acetaminophen -codeine (TYLENOL  #3) 300-30 MG tablet Take 1 tablet by mouth every 6 (six) hours as needed. Patient not taking: Reported on 12/16/2023 10/18/23   [provider]  aspirin-acetaminophen -caffeine (EXCEDRIN MIGRAINE) 250-250-65 MG tablet Take 2 tablets by mouth daily as needed for headache or migraine.    [provider]  Calcium Carbonate-Vit D-Min (CALCIUM 1200 PO) Take 1 tablet by mouth daily.    [provider]  clonazePAM  (KLONOPIN ) 1 MG tablet TAKE 1 TABLET BY MOUTH 2 TIMES A DAY MAY TAKE ADDITIONAL TABLET IF NEEDED 06/01/24   Rhys Boyer T, PA-C  clotrimazole-betamethasone (LOTRISONE) cream Apply 1 Application topically as needed (rash). 11/02/23   [provider]  eletriptan  (RELPAX ) 40 MG tablet TAKE 1 TABLET BY MOUTH AT ONSET OF HEADACHE; MAY REPEAT 1 TABLET IN 2 HOURS IF NEEDED. 08/02/23   Rhys Boyer T, PA-C  esomeprazole (NEXIUM) 20 MG capsule Take 20 mg by mouth daily.    [provider]  gabapentin  (NEURONTIN ) 300 MG capsule TAKE THREE CAPSULES BY MOUTH EVERY NIGHT AT BEDTIME 11/17/23   Hurst, Boyer T, PA-C  ibuprofen  (ADVIL ) 800 MG tablet Take 800 mg by mouth 3  (three) times daily. Patient not taking: Reported on 12/16/2023 10/18/23   [provider]  ketorolac  (ACULAR ) 0.5 % ophthalmic solution SMARTSIG:In Eye(s) Patient not taking: Reported on 12/16/2023 12/07/23   [provider]  Magnesium  400 MG CAPS Take 400 mg by mouth 2 (two) times daily.     [provider]  methylPREDNISolone  (MEDROL  DOSEPAK) 4 MG TBPK tablet See admin instructions. Patient not taking: Reported on 12/16/2023 11/30/23   [provider]  moxifloxacin (VIGAMOX) 0.5 % ophthalmic solution Place 1 drop into the left eye 4 (four) times daily. Patient not taking: Reported on 12/16/2023 12/06/23   [provider]  ondansetron  (ZOFRAN ) 8 MG tablet TAKE 1 TABLET BY MOUTH EVERY 8 HOURS AS NEEDED FOR NAUSEA AND/OR VOMITING Patient not taking: Reported on 12/16/2023 10/26/23   Calone, Gregory D, FNP  prednisoLONE acetate (PRED FORTE) 1 % ophthalmic suspension Place 1 drop into the left eye 4 (four) times daily. Patient not taking: Reported on 12/16/2023 12/06/23   [provider]  promethazine  (PHENERGAN ) 25 MG tablet Take 25 mg by mouth as needed for nausea or vomiting.    [provider]  tiZANidine  (ZANAFLEX ) 4 MG tablet Take 4 mg by mouth at bedtime.    [provider]  triamcinolone cream (KENALOG) 0.1 % 1 Application. Patient not taking: Reported on 12/16/2023 09/18/23   [provider]  venlafaxine  XR (EFFEXOR -XR) 150 MG 24 hr capsule Take 2 capsules (300 mg total) by  mouth daily. 11/17/23   Rhys Verneita DASEN, PA-C    Allergies: Cephalexin, Metoclopramide , Ancef  [cefazolin ], Vancomycin , Wellbutrin [bupropion], and Aspirin    Review of Systems Ten systems reviewed and are negative for acute change, except as noted in the HPI.    Updated Vital Signs BP 116/78 (BP Location: Right Arm)   Pulse 62   Temp 97.9 F (36.6 C) (Oral)   Resp 18   Ht 5' 7 (1.702 m)   Wt 81.6 kg   SpO2 96%   BMI 28.19 kg/m   Physical  Exam Vitals and nursing [name] reviewed.  Constitutional:      General: She is not in acute distress.    Appearance: She is well-developed. She is not diaphoretic.     Comments: Nontoxic appearing  HENT:     Head: Normocephalic and atraumatic.  Eyes:     General: No scleral icterus.    Conjunctiva/sclera: Conjunctivae normal.  Cardiovascular:     Rate and Rhythm: Normal rate and regular rhythm.     Comments: Capillary refill brisk in all digits of the L hand. L hand is warm and well perfused. Pulmonary:     Effort: Pulmonary effort is normal. No respiratory distress.  Musculoskeletal:     Left wrist: Swelling, deformity and tenderness present. Decreased range of motion.     Cervical back: Normal range of motion.     Comments: Decreased ROM of the L wrist with obvious deformity.  Skin:    General: Skin is warm and dry.     Coloration: Skin is not pale.     Findings: No erythema or rash.  Neurological:     Mental Status: She is alert and oriented to person, place, and time.     Coordination: Coordination normal.  Psychiatric:        Behavior: Behavior normal.     (all labs ordered are listed, but only abnormal results are displayed) Labs Reviewed - No data to display  EKG: None  Radiology: DG Humerus Left Result Date: 06/11/2024 EXAM: AP AND LATERAL (2) VIEW(S) XRAY OF THE LEFT HUMERUS 06/11/2024 06:16:33 AM COMPARISON: None available. CLINICAL HISTORY: fall fall FINDINGS: BONES AND JOINTS: No acute fracture. No focal osseous lesion. No joint dislocation. SOFT TISSUES: The soft tissues are unremarkable. IMPRESSION: 1. No acute fracture or dislocation. Electronically signed by: Francis Quam MD 06/11/2024 06:26 AM EST RP Workstation: [id number]   DG Shoulder Left Result Date: 06/11/2024 EXAM: 1 VIEW(S) XRAY OF THE LEFT SHOULDER 06/11/2024 06:16:33 AM COMPARISON: None available. CLINICAL HISTORY: fall FINDINGS: BONES AND JOINTS: Glenohumeral joint is normally aligned. No acute  fracture or dislocation. Mild degenerative changes identified at the acromioclavicular joint. SOFT TISSUES: No abnormal calcifications. Visualized lung is unremarkable. IMPRESSION: 1. No acute fracture or dislocation. Electronically signed by: Waddell Calk MD 06/11/2024 06:26 AM EST RP Workstation: [id number]   DG Forearm Left Result Date: 06/11/2024 EXAM: VIEW(S) XRAY OF THE LEFT FOREARM 06/11/2024 06:15:10 AM COMPARISON: None available. CLINICAL HISTORY: wrist deformity FINDINGS: FINDINGS: BONES AND JOINTS: There is mild osteopenia. There is an acute, closed intraarticular distal radial metaphyseal fracture with impaction and dorsal tilt of the articular surface fragments, a mild spreading of the comminution fragments at the fracture margins, and a slight radial translation of the larger fragments. There is a laterally displaced ulnar styloid fracture as well. There is no further evidence of fractures. No joint dislocation. SOFT TISSUES: There is moderate circumferential soft tissue swelling at the wrist, greater dorsally. IMPRESSION: 1. Acute, closed  intraarticular distal radial metaphyseal fracture with impaction, dorsal tilt, mild comminution fragment spreading, and slight radial translation of the larger fragments. 2. Laterally displaced ulnar styloid fracture. 3. Moderate circumferential wrist soft tissue swelling, greater dorsally. Electronically signed by: Francis Quam MD 06/11/2024 06:25 AM EST RP Workstation: [id number]   DG Wrist 2 Views Left Result Date: 06/11/2024 EXAM: 2 VIEW(S) XRAY OF THE LEFT WRIST 06/11/2024 06:15:10 AM COMPARISON: None available. CLINICAL HISTORY: 190176 Fall 190176 FINDINGS: BONES AND JOINTS: There is an acute closed comminuted intraarticular fracture of the distal radial metaphysis, with impaction and about 30 degrees dorsal tilt of the articular surface fragments at the fracture site. There is a mild spreading of the comminuted fragments at the fracture margins and a  slight radial translation of the largest fragments. There is also a laterally displaced ulnar styloid fracture. No carpal fracture is seen. No joint dislocation. Mild osteopenia. SOFT TISSUES: There is moderate soft tissue swelling, greatest dorsally. IMPRESSION: 1. Acute closed comminuted intraarticular distal radial metaphyseal fracture with impaction, approximately 30 degrees dorsal tilt, mild fragment spreading, and slight radial translation of the largest fragments. 2. Laterally displaced ulnar styloid fracture. 3. Moderate dorsal-predominant soft tissue swelling. 4. Mild osteopenia. Electronically signed by: Francis Quam MD 06/11/2024 06:22 AM EST RP Workstation: [id number]     Procedures   Medications Ordered in the ED  HYDROmorphone  (DILAUDID ) injection 1 mg (has no administration in time range)  ondansetron  (ZOFRAN ) injection 4 mg (has no administration in time range)    Clinical Course as of 06/11/24 9367  [facility] Jun 11, 2024  9448 Patient in Xray [KH]  9389 I have viewed the patient's x-rays of the left shoulder and humerus which did not show evidence of fracture, bony dislocation. [KH]  [phone] Viewed patient's left wrist x-ray which is notable for comminuted distal radius fracture with angulation and displacement.  Do not definitively see fracture of the distal ulna. [KH]    Clinical Course User Index [KH] Keith Sor, PA-C                                 Medical Decision Making Amount and/or Complexity of Data Reviewed Radiology: ordered.  Risk Prescription drug management.   This patient presents to the ED for concern of LUE pain, this involves an extensive number of treatment options, and is a complaint that carries with it a high risk of complications and morbidity.  The differential diagnosis includes fracture vs joint dislocation vs sprain/strain vs compartment syndrome   Co morbidities that complicate the patient evaluation  Depression Migraines   Additional  history obtained:  Additional history obtained from spouse, at bedside   Imaging Studies ordered:  I ordered imaging studies including Xrays of the LUE  I independently visualized and interpreted imaging which showed fracture L radius and ulnar styloid I agree with the radiologist interpretation   Cardiac Monitoring:  The patient was maintained on a cardiac monitor.  I personally viewed and interpreted the cardiac monitored which showed an underlying rhythm of: NSR   Medicines ordered and prescription drug management:  I ordered medication including Dilaudid  for pain  Reevaluation of the patient after these medicines showed that the patient improved I have reviewed the patients home medicines and have made adjustments as needed   Test Considered:  CT wrist, left   Problem List / ED Course:  As above   Reevaluation:  After the interventions noted above, I  reevaluated the patient and found that they have :stayed the same   Social Determinants of Health:  Good social support   Dispostion:  Care signed out to [place], PA-C at shift change.      Final diagnoses:  Closed fracture of distal end of left radius, unspecified fracture morphology, initial encounter  Closed nondisplaced fracture of styloid process of left ulna, initial encounter    ED Discharge Orders     None          Keith Sor, PA-C 06/11/24 9343    Griselda Norris, MD 06/11/24 [phone]

## 2024-06-12 DIAGNOSIS — S52502A Unspecified fracture of the lower end of left radius, initial encounter for closed fracture: Secondary | ICD-10-CM | POA: Diagnosis not present

## 2024-06-18 DIAGNOSIS — S52502A Unspecified fracture of the lower end of left radius, initial encounter for closed fracture: Secondary | ICD-10-CM | POA: Diagnosis not present

## 2024-06-29 ENCOUNTER — Encounter: Payer: Self-pay | Admitting: Physician Assistant

## 2024-06-29 ENCOUNTER — Ambulatory Visit: Admitting: Physician Assistant

## 2024-06-29 DIAGNOSIS — F411 Generalized anxiety disorder: Secondary | ICD-10-CM | POA: Diagnosis not present

## 2024-06-29 DIAGNOSIS — F3341 Major depressive disorder, recurrent, in partial remission: Secondary | ICD-10-CM

## 2024-06-29 DIAGNOSIS — F439 Reaction to severe stress, unspecified: Secondary | ICD-10-CM

## 2024-06-29 DIAGNOSIS — Z62820 Parent-biological child conflict: Secondary | ICD-10-CM

## 2024-06-29 DIAGNOSIS — G43909 Migraine, unspecified, not intractable, without status migrainosus: Secondary | ICD-10-CM

## 2024-06-29 MED ORDER — GABAPENTIN 300 MG PO CAPS: ORAL_CAPSULE | ORAL | 1 refills | Status: AC

## 2024-06-29 MED ORDER — VENLAFAXINE HCL ER 150 MG PO CP24: 300.0000 mg | ORAL_CAPSULE | Freq: Every day | ORAL | 1 refills | Status: AC

## 2024-06-29 MED ORDER — CLONAZEPAM 1 MG PO TABS: ORAL_TABLET | ORAL | 5 refills | Status: AC

## 2024-06-29 NOTE — Progress Notes (Signed)
 [facility]  Patient ID: Amanda [name],  MRN: [id number]  PCP: System, Provider Not In  Date of Evaluation: 06/29/2024 Time spent:25 minutes  Chief Complaint:  Chief Complaint   Anxiety; Depression; Follow-up    HISTORY/CURRENT STATUS: HPI  For routine med check.  Is stressed b/c she fx wrist a few weeks ago.  In a lot of pain.  Under care of ortho. Still stressed at home too, discord between her and her husband and daughter.  She doesn't do anything for fun except read but when she has a migraine, she can't read.  She stays in her room a lot b/c she doesn't like to be around her husband.  Energy and motivation are fair to good depending on the day.  No extreme sadness, tearfulness, or feelings of hopelessness.  Sleeps ok.  ADLs and personal hygiene are limited right now b/c her left forearm is in a cast.  Denies any changes in concentration, making decisions, or remembering things.  Appetite has not changed. Anxiety is controlled. Klonopin  helps when she feels panicky.   No mania, delirium, AH/VH.  No SI/HI.  Individual Medical History/ Review of Systems: Changes? :Yes  fell during the night a few weeks ago and broke her wrist.      Past medications for mental health diagnoses include: Effexor , Wellbutrin, Prozac  caused weight loss and GI, Relpax , Klonopin   Allergies: Cephalexin, Metoclopramide , Ancef  [cefazolin ], Vancomycin , Wellbutrin [bupropion], and Aspirin  Current Medications:  Current Outpatient Medications:    aspirin-acetaminophen -caffeine (EXCEDRIN MIGRAINE) 250-250-65 MG tablet, Take 2 tablets by mouth daily as needed for headache or migraine., Disp: , Rfl:    Calcium Carbonate-Vit D-Min (CALCIUM 1200 PO), Take 1 tablet by mouth daily., Disp: , Rfl:    clotrimazole-betamethasone (LOTRISONE) cream, Apply 1 Application topically as needed (rash)., Disp: , Rfl:    eletriptan  (RELPAX ) 40 MG tablet, TAKE 1 TABLET BY MOUTH AT ONSET OF HEADACHE; MAY REPEAT 1  TABLET IN 2 HOURS IF NEEDED., Disp: 10 tablet, Rfl: 11   esomeprazole (NEXIUM) 20 MG capsule, Take 20 mg by mouth daily., Disp: , Rfl:    Magnesium  400 MG CAPS, Take 400 mg by mouth 2 (two) times daily. , Disp: , Rfl:    oxyCODONE -acetaminophen  (PERCOCET) 10-325 MG tablet, Take 1 tablet 3 times a day by oral route as needed for pain for 5 days., Disp: , Rfl:    oxyCODONE -acetaminophen  (PERCOCET/ROXICET) 5-325 MG tablet, Take 1 tablet by mouth every 6 (six) hours as needed for severe pain (pain score 7-10)., Disp: 10 tablet, Rfl: 0   promethazine  (PHENERGAN ) 25 MG tablet, Take 25 mg by mouth as needed for nausea or vomiting., Disp: , Rfl:    tiZANidine  (ZANAFLEX ) 4 MG tablet, Take 4 mg by mouth at bedtime., Disp: , Rfl:    clonazePAM  (KLONOPIN ) 1 MG tablet, TAKE 1 TABLET BY MOUTH 2 TIMES A DAY MAY TAKE ADDITIONAL TABLET IF NEEDED, Disp: 75 tablet, Rfl: 5   gabapentin  (NEURONTIN ) 300 MG capsule, TAKE THREE CAPSULES BY MOUTH EVERY NIGHT AT BEDTIME, Disp: 270 capsule, Rfl: 1   ibuprofen  (ADVIL ) 800 MG tablet, Take 800 mg by mouth 3 (three) times daily. (Patient not taking: Reported on 12/16/2023), Disp: , Rfl:    ketorolac  (ACULAR ) 0.5 % ophthalmic solution, SMARTSIG:In Eye(s) (Patient not taking: Reported on 12/16/2023), Disp: , Rfl:    methylPREDNISolone  (MEDROL  DOSEPAK) 4 MG TBPK tablet, See admin instructions. (Patient not taking: Reported on 12/16/2023), Disp: , Rfl:    moxifloxacin (VIGAMOX) 0.5 %  ophthalmic solution, Place 1 drop into the left eye 4 (four) times daily. (Patient not taking: Reported on 12/16/2023), Disp: , Rfl:    ondansetron  (ZOFRAN ) 8 MG tablet, TAKE 1 TABLET BY MOUTH EVERY 8 HOURS AS NEEDED FOR NAUSEA AND/OR VOMITING (Patient not taking: Reported on 12/16/2023), Disp: 30 tablet, Rfl: 0   prednisoLONE acetate (PRED FORTE) 1 % ophthalmic suspension, Place 1 drop into the left eye 4 (four) times daily. (Patient not taking: Reported on 12/16/2023), Disp: , Rfl:    triamcinolone cream  (KENALOG) 0.1 %, 1 Application. (Patient not taking: Reported on 12/16/2023), Disp: , Rfl:    venlafaxine  XR (EFFEXOR -XR) 150 MG 24 hr capsule, Take 2 capsules (300 mg total) by mouth daily., Disp: 180 capsule, Rfl: 1 Medication Side Effects: none  Family Medical/ Social History: Changes? Had to put one of their dogs to sleep.   MENTAL HEALTH EXAM:  There were no vitals taken for this visit.There is no height or weight on file to calculate BMI.  General Appearance: Casual, Well Groomed, and left forearm is in a cast  Eye Contact:  Good  Speech:  Clear and Coherent and Normal Rate  Volume:  Normal  Mood:  Euthymic  Affect:  Congruent  Thought Process:  Goal Directed and Descriptions of Associations: Circumstantial  Orientation:  Full (Time, Place, and Person)  Thought Content: Logical   Suicidal Thoughts:  No  Homicidal Thoughts:  No  Memory:  Immediate;   Fair Recent;   Fair Remote;   Good  Judgement:  Good  Insight:  Good  Psychomotor Activity:  Normal  Concentration:  Concentration: Good and Attention Span: Fair  Recall:  Good  Fund of Knowledge: Good  Language: Good  Assets:  Communication Skills Desire for Improvement Financial Resources/Insurance Housing Resilience Social Support Transportation  ADL's:  Intact  Cognition: WNL  Prognosis:  Good   DIAGNOSES:    ICD-10-CM   1. Recurrent major depressive disorder, in partial remission  F33.41     2. Generalized anxiety disorder  F41.1 clonazePAM  (KLONOPIN ) 1 MG tablet    3. Relationship problem between parent and child  Z62.820     4. Stress at home  F43.9     5. Migraine syndrome  G43.909      Receiving Psychotherapy: No    Dr. Jodie Mitchum in the past  RECOMMENDATIONS:  PDMP was reviewed.  Klonopin  filled 06/01/2024.  Gabapentin  filled 01/30/2024.  Oxycodone  given 06/18/2024. I provided approximately  20 minutes of face to face time during this encounter, including time spent before and after the visit in  records review, medical decision making, counseling pertinent to today's visit, and charting.   She is doing well as far as her medications go.  I recommend she get back in counseling.  Continue Klonopin  1 mg, 1 p.o. 3 times daily as needed but limit to 2.5 mg a day. Continue Relpax  40 mg, 1 p.o. daily as needed migraine, may repeat in 2 hours as needed.  Neurology now prescribes. Continue gabapentin  300 mg, 3 p.o. nightly. Continue Zofran  8 mg, 1 p.o. every 8 hours as needed nausea and vomiting. Continue Phenergan  12.5 mg, 1 p.o. every 8 hours as needed nausea and vomiting. Continue Effexor  XR 150 mg,  2 po qd Recommend restarting therapy with Dr. Jodie Kendall. Return in 6 months.  Verneita Cooks, PA-C

## 2024-07-02 ENCOUNTER — Other Ambulatory Visit: Payer: Self-pay | Admitting: Physician Assistant

## 2024-07-04 NOTE — Telephone Encounter (Signed)
 Note says Neurology now prescribes, verify with patient.

## 2024-07-05 DIAGNOSIS — S52572P Other intraarticular fracture of lower end of left radius, subsequent encounter for closed fracture with malunion: Secondary | ICD-10-CM | POA: Diagnosis not present

## 2024-07-10 DIAGNOSIS — S52572P Other intraarticular fracture of lower end of left radius, subsequent encounter for closed fracture with malunion: Secondary | ICD-10-CM | POA: Diagnosis not present

## 2024-08-22 ENCOUNTER — Ambulatory Visit: Admitting: Psychiatry

## 2024-12-28 ENCOUNTER — Ambulatory Visit: Admitting: Physician Assistant
